# Patient Record
Sex: Female | Born: 1980 | Race: Black or African American | Hispanic: No | Marital: Married | Smoking: Never smoker
Health system: Southern US, Community
[De-identification: ages and names within clinical notes are randomized; demographics above are authoritative.]

## PROBLEM LIST (undated history)

## (undated) ENCOUNTER — Inpatient Hospital Stay (HOSPITAL_COMMUNITY): Payer: Self-pay

## (undated) DIAGNOSIS — M545 Low back pain, unspecified: Secondary | ICD-10-CM

## (undated) DIAGNOSIS — I319 Disease of pericardium, unspecified: Secondary | ICD-10-CM

## (undated) DIAGNOSIS — G8929 Other chronic pain: Secondary | ICD-10-CM

## (undated) DIAGNOSIS — R519 Headache, unspecified: Secondary | ICD-10-CM

## (undated) DIAGNOSIS — R51 Headache: Secondary | ICD-10-CM

## (undated) DIAGNOSIS — I313 Pericardial effusion (noninflammatory): Secondary | ICD-10-CM

## (undated) DIAGNOSIS — I3139 Other pericardial effusion (noninflammatory): Secondary | ICD-10-CM

## (undated) DIAGNOSIS — R1110 Vomiting, unspecified: Secondary | ICD-10-CM

## (undated) DIAGNOSIS — D573 Sickle-cell trait: Secondary | ICD-10-CM

## (undated) DIAGNOSIS — O1495 Unspecified pre-eclampsia, complicating the puerperium: Secondary | ICD-10-CM

## (undated) HISTORY — DX: Pericardial effusion (noninflammatory): I31.3

## (undated) HISTORY — DX: Other pericardial effusion (noninflammatory): I31.39

## (undated) HISTORY — DX: Disease of pericardium, unspecified: I31.9

## (undated) HISTORY — DX: Unspecified pre-eclampsia, complicating the puerperium: O14.95

## (undated) HISTORY — DX: Vomiting, unspecified: R11.10

---
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
## 2002-07-04 ENCOUNTER — Other Ambulatory Visit: Admission: RE | Admit: 2002-07-04 | Discharge: 2002-07-04 | Payer: Self-pay | Admitting: Obstetrics and Gynecology

## 2003-01-11 ENCOUNTER — Inpatient Hospital Stay: Admission: AD | Admit: 2003-01-11 | Discharge: 2003-01-11 | Payer: Self-pay | Admitting: Obstetrics & Gynecology

## 2003-01-13 ENCOUNTER — Inpatient Hospital Stay (HOSPITAL_COMMUNITY): Admission: AD | Admit: 2003-01-13 | Discharge: 2003-01-13 | Payer: Self-pay | Admitting: *Deleted

## 2003-01-17 ENCOUNTER — Inpatient Hospital Stay (HOSPITAL_COMMUNITY): Admission: AD | Admit: 2003-01-17 | Discharge: 2003-01-20 | Payer: Self-pay | Admitting: Obstetrics and Gynecology

## 2003-05-08 ENCOUNTER — Emergency Department (HOSPITAL_COMMUNITY): Admission: EM | Admit: 2003-05-08 | Discharge: 2003-05-08 | Payer: Self-pay | Admitting: Emergency Medicine

## 2003-08-11 ENCOUNTER — Other Ambulatory Visit: Admission: RE | Admit: 2003-08-11 | Discharge: 2003-08-11 | Payer: Self-pay | Admitting: Obstetrics and Gynecology

## 2003-12-11 ENCOUNTER — Emergency Department (HOSPITAL_COMMUNITY): Admission: EM | Admit: 2003-12-11 | Discharge: 2003-12-11 | Payer: Self-pay | Admitting: Emergency Medicine

## 2004-10-04 ENCOUNTER — Other Ambulatory Visit: Admission: RE | Admit: 2004-10-04 | Discharge: 2004-10-04 | Payer: Self-pay | Admitting: Obstetrics and Gynecology

## 2004-11-20 ENCOUNTER — Emergency Department (HOSPITAL_COMMUNITY): Admission: EM | Admit: 2004-11-20 | Discharge: 2004-11-20 | Payer: Self-pay | Admitting: Emergency Medicine

## 2005-03-10 ENCOUNTER — Inpatient Hospital Stay (HOSPITAL_COMMUNITY): Admission: AD | Admit: 2005-03-10 | Discharge: 2005-03-10 | Payer: Self-pay | Admitting: Obstetrics and Gynecology

## 2005-05-07 ENCOUNTER — Inpatient Hospital Stay (HOSPITAL_COMMUNITY): Admission: AD | Admit: 2005-05-07 | Discharge: 2005-05-09 | Payer: Self-pay | Admitting: Pediatrics

## 2005-05-20 ENCOUNTER — Inpatient Hospital Stay (HOSPITAL_COMMUNITY): Admission: AD | Admit: 2005-05-20 | Discharge: 2005-05-23 | Payer: Self-pay | Admitting: Obstetrics and Gynecology

## 2006-11-12 ENCOUNTER — Emergency Department (HOSPITAL_COMMUNITY): Admission: EM | Admit: 2006-11-12 | Discharge: 2006-11-12 | Payer: Self-pay | Admitting: Emergency Medicine

## 2009-01-23 ENCOUNTER — Emergency Department (HOSPITAL_COMMUNITY): Admission: EM | Admit: 2009-01-23 | Discharge: 2009-01-23 | Payer: Self-pay | Admitting: Emergency Medicine

## 2009-07-25 ENCOUNTER — Emergency Department (HOSPITAL_COMMUNITY): Admission: EM | Admit: 2009-07-25 | Discharge: 2009-07-25 | Payer: Self-pay | Admitting: Emergency Medicine

## 2009-09-11 ENCOUNTER — Inpatient Hospital Stay (HOSPITAL_COMMUNITY): Admission: AD | Admit: 2009-09-11 | Discharge: 2009-09-11 | Payer: Self-pay | Admitting: Obstetrics & Gynecology

## 2009-09-24 ENCOUNTER — Inpatient Hospital Stay (HOSPITAL_COMMUNITY): Admission: AD | Admit: 2009-09-24 | Discharge: 2009-09-24 | Payer: Self-pay | Admitting: Obstetrics & Gynecology

## 2009-11-23 ENCOUNTER — Inpatient Hospital Stay (HOSPITAL_COMMUNITY): Admission: AD | Admit: 2009-11-23 | Discharge: 2009-11-24 | Payer: Self-pay | Admitting: Obstetrics and Gynecology

## 2009-11-24 ENCOUNTER — Emergency Department (HOSPITAL_COMMUNITY): Admission: EM | Admit: 2009-11-24 | Discharge: 2009-11-24 | Payer: Self-pay | Admitting: Emergency Medicine

## 2009-12-06 ENCOUNTER — Ambulatory Visit (HOSPITAL_COMMUNITY): Admission: RE | Admit: 2009-12-06 | Discharge: 2009-12-06 | Payer: Self-pay | Admitting: Obstetrics

## 2010-01-15 ENCOUNTER — Inpatient Hospital Stay (HOSPITAL_COMMUNITY): Admission: AD | Admit: 2010-01-15 | Discharge: 2010-01-15 | Payer: Self-pay | Admitting: Obstetrics

## 2010-03-26 ENCOUNTER — Ambulatory Visit (HOSPITAL_COMMUNITY): Admission: RE | Admit: 2010-03-26 | Discharge: 2010-03-26 | Payer: Self-pay | Admitting: Obstetrics & Gynecology

## 2010-03-29 ENCOUNTER — Inpatient Hospital Stay (HOSPITAL_COMMUNITY): Admission: AD | Admit: 2010-03-29 | Discharge: 2010-03-31 | Payer: Self-pay | Admitting: Obstetrics

## 2010-04-28 ENCOUNTER — Inpatient Hospital Stay (HOSPITAL_COMMUNITY): Admission: RE | Admit: 2010-04-28 | Discharge: 2010-05-01 | Payer: Self-pay | Admitting: Obstetrics & Gynecology

## 2010-05-11 ENCOUNTER — Inpatient Hospital Stay (HOSPITAL_COMMUNITY): Admission: AD | Admit: 2010-05-11 | Discharge: 2010-05-16 | Payer: Self-pay | Admitting: Obstetrics

## 2010-09-20 ENCOUNTER — Inpatient Hospital Stay (HOSPITAL_COMMUNITY)
Admission: AD | Admit: 2010-09-20 | Discharge: 2010-09-20 | Disposition: A | Discharge status: Home / Self Care | Payer: Self-pay | Source: Ambulatory Visit | Attending: Obstetrics and Gynecology | Admitting: Obstetrics and Gynecology

## 2010-09-20 DIAGNOSIS — IMO0002 Reserved for concepts with insufficient information to code with codable children: Secondary | ICD-10-CM | POA: Insufficient documentation

## 2010-09-20 LAB — URINALYSIS, ROUTINE W REFLEX MICROSCOPIC
Hgb urine dipstick: NEGATIVE
Nitrite: NEGATIVE

## 2010-09-25 LAB — CBC
HCT: 32.1 % — ABNORMAL LOW (ref 36.0–46.0)
Hemoglobin: 11.4 g/dL — ABNORMAL LOW (ref 12.0–15.0)
Hemoglobin: 9.9 g/dL — ABNORMAL LOW (ref 12.0–15.0)
MCH: 30.2 pg (ref 26.0–34.0)
MCH: 30.3 pg (ref 26.0–34.0)
MCHC: 35.6 g/dL (ref 30.0–36.0)
Platelets: 268 10*3/uL (ref 150–400)
RBC: 3.26 MIL/uL — ABNORMAL LOW (ref 3.87–5.11)
RBC: 3.77 MIL/uL — ABNORMAL LOW (ref 3.87–5.11)
WBC: 7.1 10*3/uL (ref 4.0–10.5)

## 2010-09-26 LAB — URINE CULTURE
Colony Count: NO GROWTH
Culture: NO GROWTH

## 2010-09-26 LAB — URINALYSIS, ROUTINE W REFLEX MICROSCOPIC
Bilirubin Urine: NEGATIVE
Hgb urine dipstick: NEGATIVE
Nitrite: NEGATIVE
Urobilinogen, UA: 2 mg/dL — ABNORMAL HIGH (ref 0.0–1.0)

## 2010-09-26 LAB — CBC
MCH: 29.2 pg (ref 26.0–34.0)
MCHC: 33.2 g/dL (ref 30.0–36.0)
RBC: 3.9 MIL/uL (ref 3.87–5.11)
RDW: 13 % (ref 11.5–15.5)
WBC: 7.8 10*3/uL (ref 4.0–10.5)

## 2010-09-26 LAB — RPR: RPR Ser Ql: NONREACTIVE

## 2010-10-01 LAB — URINALYSIS, ROUTINE W REFLEX MICROSCOPIC
Bilirubin Urine: NEGATIVE
Glucose, UA: NEGATIVE mg/dL
Ketones, ur: NEGATIVE mg/dL
Nitrite: NEGATIVE
Protein, ur: NEGATIVE mg/dL
Protein, ur: NEGATIVE mg/dL
Specific Gravity, Urine: 1.008 (ref 1.005–1.030)
Urobilinogen, UA: 1 mg/dL (ref 0.0–1.0)
pH: 5.5 (ref 5.0–8.0)

## 2010-10-01 LAB — URINE CULTURE: Colony Count: 25000

## 2010-10-07 LAB — URINE MICROSCOPIC-ADD ON

## 2010-10-07 LAB — HCG, QUANTITATIVE, PREGNANCY: hCG, Beta Chain, Quant, S: 89151 m[IU]/mL — ABNORMAL HIGH (ref <5)

## 2010-10-07 LAB — CBC
MCHC: 34.3 g/dL (ref 30.0–36.0)
MCV: 81.3 fL (ref 78.0–100.0)
Platelets: 314 10*3/uL (ref 150–400)
RBC: 4.49 MIL/uL (ref 3.87–5.11)

## 2010-10-07 LAB — URINALYSIS, ROUTINE W REFLEX MICROSCOPIC
Glucose, UA: NEGATIVE mg/dL
Ketones, ur: NEGATIVE mg/dL
Leukocytes, UA: NEGATIVE
Nitrite: NEGATIVE
Protein, ur: NEGATIVE mg/dL
pH: 6 (ref 5.0–8.0)

## 2010-10-07 LAB — WET PREP, GENITAL
Trich, Wet Prep: NONE SEEN
Yeast Wet Prep HPF POC: NONE SEEN

## 2010-10-07 LAB — GC/CHLAMYDIA PROBE AMP, GENITAL
Chlamydia, DNA Probe: NEGATIVE
GC Probe Amp, Genital: NEGATIVE

## 2010-10-20 LAB — POCT I-STAT, CHEM 8
Creatinine, Ser: 0.6 mg/dL (ref 0.4–1.2)
Glucose, Bld: 86 mg/dL (ref 70–99)
Hemoglobin: 13.6 g/dL (ref 12.0–15.0)
Sodium: 138 mEq/L (ref 135–145)
TCO2: 25 mmol/L (ref 0–100)

## 2010-10-20 LAB — CBC
MCHC: 35.3 g/dL (ref 30.0–36.0)
Platelets: 329 10*3/uL (ref 150–400)
RBC: 4.89 MIL/uL (ref 3.87–5.11)
RDW: 13.4 % (ref 11.5–15.5)

## 2010-10-20 LAB — URINE MICROSCOPIC-ADD ON

## 2010-10-20 LAB — GC/CHLAMYDIA PROBE AMP, GENITAL: GC Probe Amp, Genital: NEGATIVE

## 2010-10-20 LAB — URINALYSIS, ROUTINE W REFLEX MICROSCOPIC
Glucose, UA: NEGATIVE mg/dL
Protein, ur: NEGATIVE mg/dL

## 2010-10-20 LAB — COMPREHENSIVE METABOLIC PANEL
ALT: 12 U/L (ref 0–35)
AST: 17 U/L (ref 0–37)
Calcium: 9.3 mg/dL (ref 8.4–10.5)
GFR calc Af Amer: >60 mL/min (ref >60)
Sodium: 136 mEq/L (ref 135–145)
Total Protein: 7.3 g/dL (ref 6.0–8.3)

## 2010-10-20 LAB — WET PREP, GENITAL: WBC, Wet Prep HPF POC: NONE SEEN

## 2010-11-29 NOTE — Discharge Summary (Signed)
Anna Walls, Anna Walls               ACCOUNT NO.:  [id number]   MEDICAL RECORD NO.:  [id number]          PATIENT TYPE:  INP   LOCATION:  9307                          FACILITY:  [facility]   PHYSICIAN:  Miguel Aschoff, M.D.       DATE OF BIRTH:  06/21/1980   DATE OF ADMISSION:  05/20/2005  DATE OF DISCHARGE:  05/23/2005                                 DISCHARGE SUMMARY   FINAL DIAGNOSES:  1.  Intrauterine pregnancy at term.  2.  Fetal hydronephrosis of the right kidney.  3.  Induction of labor.   PROCEDURE:  Spontaneous vaginal delivery of a female infant with Apgars of 9  and 9,  delivery performed by Dr. Malva Limes.  Complications:  None.   This was 30 year old G3, P2-0-0-2, presents at term for induction secondary  to continuous low back pain and pressure.  The patient's antepartum course  up to this point was complicated by the finding of hydronephrosis of the  right fetal kidney on ultrasound.  This was continued to be monitored  throughout the pregnancy, and the decision was made already to consult  postpartum with a pediatric urologist and the patient, I think, is already  scheduled for that.  Pediatrics was also notified at this time.  Otherwise  her antepartum course had been uncomplicated.  She did have a negative group  B strep culture obtained in the office at 35 weeks.  She had amniotomy,  progressed along a normal labor curve, dilated to complete and complete.  She had a spontaneous vaginal delivery of a 7 pound 8 ounce female infant with  Apgars of 9 and 9 over a first degree perineal laceration.  The delivery  went without complications.  The patient's postoperative course was benign  without any significant fevers.  She was felt ready for discharge on  postpartum day #2.  She was sent home on a regular diet, told to decrease  activities, told to continue her prenatal vitamins, was given Darvocet-N 100  one to two every four to six hours as needed for pain.  Was to follow up in  the office in four weeks.   DISCHARGE LABORATORY DATA:  The patient had a hemoglobin of 10.7, white  blood cell count of 5.6, platelets of 408,000.      Leilani Able, P.A.-C.      Miguel Aschoff, M.D.  Electronically Signed    MB/MEDQ  D:  07/31/2005  T:  07/31/2005  Job:  [id number]

## 2011-03-24 ENCOUNTER — Emergency Department (HOSPITAL_COMMUNITY): Payer: Self-pay

## 2011-03-24 ENCOUNTER — Emergency Department (HOSPITAL_COMMUNITY)
Admission: EM | Admit: 2011-03-24 | Discharge: 2011-03-24 | Disposition: A | Discharge status: Home / Self Care | Payer: Self-pay | Attending: Emergency Medicine | Admitting: Emergency Medicine

## 2011-03-24 DIAGNOSIS — M79609 Pain in unspecified limb: Secondary | ICD-10-CM | POA: Insufficient documentation

## 2011-03-24 DIAGNOSIS — R079 Chest pain, unspecified: Secondary | ICD-10-CM | POA: Insufficient documentation

## 2011-03-24 DIAGNOSIS — R059 Cough, unspecified: Secondary | ICD-10-CM | POA: Insufficient documentation

## 2011-03-24 DIAGNOSIS — J3489 Other specified disorders of nose and nasal sinuses: Secondary | ICD-10-CM | POA: Insufficient documentation

## 2011-03-24 DIAGNOSIS — IMO0001 Reserved for inherently not codable concepts without codable children: Secondary | ICD-10-CM | POA: Insufficient documentation

## 2011-03-24 DIAGNOSIS — R630 Anorexia: Secondary | ICD-10-CM | POA: Insufficient documentation

## 2011-03-24 DIAGNOSIS — M25569 Pain in unspecified knee: Secondary | ICD-10-CM | POA: Insufficient documentation

## 2011-03-24 DIAGNOSIS — R5381 Other malaise: Secondary | ICD-10-CM | POA: Insufficient documentation

## 2011-03-24 DIAGNOSIS — J029 Acute pharyngitis, unspecified: Secondary | ICD-10-CM | POA: Insufficient documentation

## 2011-03-24 DIAGNOSIS — R05 Cough: Secondary | ICD-10-CM | POA: Insufficient documentation

## 2011-07-29 ENCOUNTER — Encounter (HOSPITAL_COMMUNITY): Payer: Self-pay | Admitting: *Deleted

## 2011-07-29 ENCOUNTER — Emergency Department (HOSPITAL_COMMUNITY)
Admission: EM | Admit: 2011-07-29 | Discharge: 2011-07-29 | Disposition: A | Discharge status: Home / Self Care | Payer: Medicaid Other | Attending: Emergency Medicine | Admitting: Emergency Medicine

## 2011-07-29 DIAGNOSIS — M25559 Pain in unspecified hip: Secondary | ICD-10-CM | POA: Insufficient documentation

## 2011-07-29 DIAGNOSIS — M545 Low back pain, unspecified: Secondary | ICD-10-CM | POA: Insufficient documentation

## 2011-07-29 DIAGNOSIS — R079 Chest pain, unspecified: Secondary | ICD-10-CM | POA: Insufficient documentation

## 2011-07-29 DIAGNOSIS — M79609 Pain in unspecified limb: Secondary | ICD-10-CM | POA: Insufficient documentation

## 2011-07-29 DIAGNOSIS — N644 Mastodynia: Secondary | ICD-10-CM | POA: Insufficient documentation

## 2011-07-29 DIAGNOSIS — N6009 Solitary cyst of unspecified breast: Secondary | ICD-10-CM | POA: Insufficient documentation

## 2011-07-29 MED ORDER — HYDROCODONE-ACETAMINOPHEN 5-325 MG PO TABS: 1.0000 | ORAL_TABLET | ORAL | Status: AC | PRN

## 2011-07-29 NOTE — ED Provider Notes (Signed)
History     CSN: [id number]  Arrival date & time 07/29/11  1749   First MD Initiated Contact with Patient 07/29/11 1806      No chief complaint on file.   (Consider location/radiation/quality/duration/timing/severity/associated sxs/prior treatment) Patient is a 31 y.o. female presenting with chest pain. The history is provided by the patient.  Chest Pain Pertinent negatives for primary symptoms include no fever, no shortness of breath and no cough. Associated symptoms comments: She is a nursing mother of a 56 month old baby who has recurrent breast and nipple pain with intermittent nipple bleeding. Today she is concerned about a painful swelling to the medial left breast at sternal border that has been there for weeks. She also complains of lower left back pain that radiates into hip and thigh. No injury. No numbness or tingling. No weakness. Marland Kitchen     History reviewed. No pertinent past medical history.  History reviewed. No pertinent past surgical history.  No family history on file.  History  Substance Use Topics  . Smoking status: Not on file  . Smokeless tobacco: Not on file  . Alcohol Use: Not on file    OB History    Grav Para Term Preterm Abortions TAB SAB Ect Mult Living                  Review of Systems  Constitutional: Negative for fever and chills.  HENT: Negative.   Respiratory: Negative.  Negative for cough and shortness of breath.   Cardiovascular: Positive for chest pain.  Gastrointestinal: Negative.   Musculoskeletal:       See HPI.  Skin: Negative.   Neurological: Negative.     Allergies  Review of patient's allergies indicates no known allergies.  Home Medications   Current Outpatient Rx  Name Route Sig Dispense Refill  . BC HEADACHE POWDER PO Oral Take 1 packet by mouth daily as needed. For headaches.    . IBUPROFEN 800 MG PO TABS Oral Take 800 mg by mouth every 8 (eight) hours as needed. For pain.      BP 111/74  Pulse 69  Temp 97.8 F  (36.6 C)  Resp 18  Ht 5\' 5"  (1.651 m)  Wt 180 lb (81.647 kg)  BMI 29.95 kg/m2  SpO2 100%  LMP 06/28/2011  Physical Exam  Constitutional: She appears well-developed and well-nourished.  HENT:  Head: Normocephalic.  Neck: Normal range of motion. Neck supple.  Cardiovascular: Normal rate and regular rhythm.   Pulmonary/Chest: Effort normal and breath sounds normal.       Large symmetric breast without redness, swelling, mass or induration. Small nodular, mobile cyst to left breast at border of chest wall at sternum. Nipples unremarkable without discoloration or bleeding.  Abdominal: Soft. Bowel sounds are normal. There is no tenderness. There is no rebound and no guarding.  Musculoskeletal: Normal range of motion.       Mild lumbar and paralumbar tenderness without swelling or discoloration.  Neurological: She is alert. She has normal reflexes. No cranial nerve deficit. Coordination normal.  Skin: Skin is warm and dry. No rash noted.  Psychiatric: She has a normal mood and affect.    ED Course  Procedures (including critical care time)  Labs Reviewed - No data to display No results found.   No diagnosis found.    MDM  I called [facility] and arranged a consultation with lactation clinic to discuss recurrent pain with breast feeding that has occurred since beginning feeding. No  current symptoms or physical exam findings.         Rodena Medin, PA-C 07/29/11 1903

## 2011-07-29 NOTE — ED Provider Notes (Signed)
Medical screening examination/treatment/procedure(s) were performed by non-physician practitioner and as supervising physician I was immediately available for consultation/collaboration.Devoria Albe, MD, Armando Gang   Ward Givens, MD 07/29/11 2227

## 2011-07-29 NOTE — ED Notes (Signed)
Pt states she is have pain in her left breast for 3 weeks. Pt states she is breast feeding and noticed a bump in left breast. Pt states when she is breast feeding her child she noticed blood coming out of the nipple. Pt denies chest pain.pt states this is a recurrent issuse

## 2011-07-30 ENCOUNTER — Ambulatory Visit (HOSPITAL_COMMUNITY)
Admit: 2011-07-30 | Discharge: 2011-07-30 | Disposition: A | Discharge status: Home / Self Care | Payer: Medicaid Other | Attending: Obstetrics and Gynecology | Admitting: Obstetrics and Gynecology

## 2011-07-30 MED ORDER — FLUCONAZOLE 150 MG PO TABS: 150.0000 mg | ORAL_TABLET | Freq: Every day | ORAL | Status: AC

## 2011-07-30 NOTE — Progress Notes (Signed)
Adult Lactation Consultation Outpatient Visit Note  Patient Name: Anna Walls Date of Birth: Nov 24, 1980 Gestational Age at Delivery: Unknown Type of Delivery: NSVD  Breastfeeding History: Frequency of Breastfeeding: 9 TIMES/DAY Length of Feeding:  Voids: N/A Stools: N/A  Supplementing / Method: SOLID FOODS Pumping:NONE  Type of Pump:   Frequency:  Volume:    Comments:    Consultation Evaluation: Patient here today by referral from Emergency room PA who saw patient yesterday PM for chronic left sided breast pain.  Baby is 30 mo old but not present with mom today.  Patient reports readmission to hospital at 1  Week postpartum for treatment of mastitis.  Patient states she has gone to the ED 3 times for this problem and has been sent home with instructions to treat as plugged duct.  She reports using warm soaks to breast several times. No breast studies have been done. Patient describes tenderness around areolar tissue and nipple on left breast which has been present since birth of baby. Patient also reports bright red bleeding from left nipple on and off. Left breast full but on palpation no areas of firmness , lumps or redness.  No nipple trauma noted.  Call placed to [place], CNM on call for faculty service today.  She came to see patient in lactation office and examined patient.  Plan discussed with patient to treat nipple with all purpose nipple cream, diflucan and radiology will call patient to schedule breast study.  Follow up planned at GYN clinic at [facility].   Initial Feeding Assessment: Pre-feed Weight: Post-feed Weight: Amount Transferred: Comments:  Additional Feeding Assessment: Pre-feed Weight: Post-feed Weight: Amount Transferred: Comments:  Additional Feeding Assessment: Pre-feed Weight: Post-feed Weight: Amount Transferred: Comments:  Total Breast milk Transferred this Visit:  Total Supplement Given:   Additional Interventions:   Follow-Up   GYN clinic [facility]      Hansel Feinstein 07/30/2011, 3:05 PM

## 2011-07-30 NOTE — Progress Notes (Signed)
Was called by lactation consultant regarding [facility] Anna Walls on-going left breast pain and intermittent bleeding. Agree w/ assessment. No obvious etiology seen. Pt reports peeling of the nipple. Will Tx for nipple yeast w/ All Purpose Nipple Ointment and Diflucan. Discussed imaging options w/ Anna Walls. Will scheduled pt w/ [facility] RN in 08/05/11, assess response to Tx and scheduled breast US.  Dorathy Kinsman 07/30/2011 4:27 PM

## 2011-07-31 ENCOUNTER — Encounter (HOSPITAL_COMMUNITY): Payer: Self-pay

## 2011-09-09 ENCOUNTER — Emergency Department (HOSPITAL_COMMUNITY)
Admission: EM | Admit: 2011-09-09 | Discharge: 2011-09-09 | Disposition: A | Discharge status: Home Health | Payer: Medicaid Other | Attending: Emergency Medicine | Admitting: Emergency Medicine

## 2011-09-09 ENCOUNTER — Encounter (HOSPITAL_COMMUNITY): Payer: Self-pay | Admitting: Emergency Medicine

## 2011-09-09 DIAGNOSIS — R05 Cough: Secondary | ICD-10-CM | POA: Insufficient documentation

## 2011-09-09 DIAGNOSIS — R1310 Dysphagia, unspecified: Secondary | ICD-10-CM | POA: Insufficient documentation

## 2011-09-09 DIAGNOSIS — R070 Pain in throat: Secondary | ICD-10-CM | POA: Insufficient documentation

## 2011-09-09 DIAGNOSIS — R059 Cough, unspecified: Secondary | ICD-10-CM | POA: Insufficient documentation

## 2011-09-09 DIAGNOSIS — R509 Fever, unspecified: Secondary | ICD-10-CM | POA: Insufficient documentation

## 2011-09-09 DIAGNOSIS — J3489 Other specified disorders of nose and nasal sinuses: Secondary | ICD-10-CM | POA: Insufficient documentation

## 2011-09-09 DIAGNOSIS — R5381 Other malaise: Secondary | ICD-10-CM | POA: Insufficient documentation

## 2011-09-09 DIAGNOSIS — H9209 Otalgia, unspecified ear: Secondary | ICD-10-CM | POA: Insufficient documentation

## 2011-09-09 DIAGNOSIS — IMO0001 Reserved for inherently not codable concepts without codable children: Secondary | ICD-10-CM | POA: Insufficient documentation

## 2011-09-09 DIAGNOSIS — R220 Localized swelling, mass and lump, head: Secondary | ICD-10-CM | POA: Insufficient documentation

## 2011-09-09 DIAGNOSIS — J329 Chronic sinusitis, unspecified: Secondary | ICD-10-CM

## 2011-09-09 DIAGNOSIS — R350 Frequency of micturition: Secondary | ICD-10-CM | POA: Insufficient documentation

## 2011-09-09 DIAGNOSIS — R599 Enlarged lymph nodes, unspecified: Secondary | ICD-10-CM | POA: Insufficient documentation

## 2011-09-09 LAB — URINALYSIS, ROUTINE W REFLEX MICROSCOPIC
Ketones, ur: NEGATIVE mg/dL
Leukocytes, UA: NEGATIVE
Nitrite: NEGATIVE
Specific Gravity, Urine: 1.009 (ref 1.005–1.030)
Urobilinogen, UA: 0.2 mg/dL (ref 0.0–1.0)
pH: 6 (ref 5.0–8.0)

## 2011-09-09 LAB — RAPID STREP SCREEN (MED CTR MEBANE ONLY): Streptococcus, Group A Screen (Direct): NEGATIVE

## 2011-09-09 MED ORDER — NAPROXEN 500 MG PO TABS: 500.0000 mg | ORAL_TABLET | Freq: Two times a day (BID) | ORAL | Status: DC

## 2011-09-09 MED ORDER — IBUPROFEN 800 MG PO TABS
800.0000 mg | ORAL_TABLET | Freq: Once | ORAL | Status: AC
  Administered 2011-09-09: 800 mg via ORAL
  Filled 2011-09-09: qty 1

## 2011-09-09 MED ORDER — FLUTICASONE PROPIONATE 50 MCG/ACT NA SUSP: 2.0000 | Freq: Every day | NASAL | Status: DC

## 2011-09-09 MED ORDER — AMOXICILLIN-POT CLAVULANATE 875-125 MG PO TABS: 1.0000 | ORAL_TABLET | Freq: Two times a day (BID) | ORAL | Status: AC

## 2011-09-09 NOTE — ED Provider Notes (Signed)
History     CSN: [id number]  Arrival date & time 09/09/11  1815   First MD Initiated Contact with Patient 09/09/11 1956      Chief Complaint  Patient presents with  . URI    (Consider location/radiation/quality/duration/timing/severity/associated sxs/prior treatment) Patient is a 31 y.o. female presenting with URI and frequency. The history is provided by the patient.  URI The primary symptoms include fever, fatigue, headaches, ear pain, sore throat, cough and myalgias. Primary symptoms do not include swollen glands, wheezing, abdominal pain, nausea, vomiting, arthralgias or rash. The current episode started more than 1 week ago (for 4 weeks ). This is a new problem. The problem has been gradually worsening.  The fever began yesterday. The maximum temperature recorded prior to her arrival was 102 to 102.9 F. The temperature was taken by an oral thermometer.  The headache began more than 2 days ago (3 days). Headache is a recurrent problem. The pain from the headache is at a severity of 7/10. The headache is not associated with aura, photophobia, double vision, eye pain, decreased vision, stiff neck, paresthesias, weakness or loss of balance.  The sore throat began more than 2 days ago. The sore throat has been gradually worsening since its onset. The sore throat is moderate in intensity. The sore throat is accompanied by trouble swallowing. The sore throat is not accompanied by drooling, hoarse voice or stridor.   The myalgias are not associated with weakness.  Symptoms associated with the illness include chills, plugged ear sensation, facial pain, sinus pressure, congestion and rhinorrhea.  Urinary Frequency This is a new problem. The current episode started yesterday. The problem occurs constantly. The problem has been gradually worsening. Associated symptoms include chills, congestion, coughing, fatigue, a fever, headaches, myalgias and a sore throat. Pertinent negatives include no  abdominal pain, arthralgias, nausea, rash, swollen glands, vomiting or weakness.    History reviewed. No pertinent past medical history.  History reviewed. No pertinent past surgical history.  No family history on file.  History  Substance Use Topics  . Smoking status: Not on file  . Smokeless tobacco: Not on file  . Alcohol Use: Not on file    OB History    Grav Para Term Preterm Abortions TAB SAB Ect Mult Living                  Review of Systems  Constitutional: Positive for fever, chills and fatigue.  HENT: Positive for ear pain, congestion, sore throat, rhinorrhea, trouble swallowing and sinus pressure. Negative for hoarse voice and drooling.   Eyes: Negative for double vision, photophobia and pain.  Respiratory: Positive for cough. Negative for wheezing and stridor.   Gastrointestinal: Negative for nausea, vomiting and abdominal pain.  Genitourinary: Positive for frequency.  Musculoskeletal: Positive for myalgias. Negative for arthralgias.  Skin: Negative for rash.  Neurological: Positive for headaches. Negative for weakness, paresthesias and loss of balance.  All other systems reviewed and are negative.    Allergies  Review of patient's allergies indicates no known allergies.  Home Medications   Current Outpatient Rx  Name Route Sig Dispense Refill  . IBUPROFEN 800 MG PO TABS Oral Take 800 mg by mouth every 8 (eight) hours as needed. For pain.      BP 120/87  Pulse 68  Temp 99.4 F (37.4 C)  Resp 20  SpO2 99%  LMP 09/06/2011  Physical Exam  Nursing note and vitals reviewed. Constitutional: She is oriented to person, place, and time. She  appears well-developed and well-nourished. No distress.  HENT:  Head: Normocephalic and atraumatic. No trismus in the jaw.  Right Ear: Tympanic membrane, external ear and ear canal normal.  Left Ear: Tympanic membrane, external ear and ear canal normal.  Nose: Nose normal. No rhinorrhea. Right sinus exhibits no  maxillary sinus tenderness and no frontal sinus tenderness. Left sinus exhibits no maxillary sinus tenderness and no frontal sinus tenderness.  Mouth/Throat: Uvula is midline and mucous membranes are normal. Normal dentition. No dental abscesses or uvula swelling. Posterior oropharyngeal edema present. No oropharyngeal exudate, posterior oropharyngeal erythema or tonsillar abscesses.       No submental edema, tongue not elevated, no trismus. No impending airway obstruction; Pt able to speak full sentences, swallow intact, no drooling, stridor, or tonsillar/uvula displacement. TM and canals normal bilaterally. Nasal congestion present. ttp of frontal and maxillary sinuses  Eyes: Conjunctivae are normal.  Neck: Trachea normal, normal range of motion and full passive range of motion without pain. Neck supple. No rigidity. Erythema present. Normal range of motion present. No Brudzinski's sign noted.       Negative Bolte sign  Cardiovascular: Normal rate and regular rhythm.   Pulmonary/Chest: Effort normal and breath sounds normal. No stridor. No respiratory distress. She has no wheezes.  Abdominal: Soft. There is no tenderness.  Musculoskeletal: Normal range of motion.  Lymphadenopathy:       Head (right side): No preauricular and no posterior auricular adenopathy present.       Head (left side): No preauricular and no posterior auricular adenopathy present.    She has cervical adenopathy.  Neurological: She is alert and oriented to person, place, and time.  Skin: Skin is warm and dry. No rash noted. She is not diaphoretic.  Psychiatric: She has a normal mood and affect.    ED Course  Procedures (including critical care time)   Labs Reviewed  URINALYSIS, ROUTINE W REFLEX MICROSCOPIC  PREGNANCY, URINE  RAPID STREP SCREEN   No results found.   No diagnosis found.    MDM  Sinusitis  Pt with fevers, acute facial pain, swelling and erythema will be dc w Augmentin for 14 days. Pt also  given fluticasone. Pt presentation not concerning for retropharyngeal or peritonsilar abscess. Strep test negative. UA no sign of UTI         Jaci Carrel, [place] 09/09/11 2158

## 2011-09-09 NOTE — ED Notes (Signed)
Pt alert, nad c/o URI, onset several weeks ago, resp even unabored, skin pwd, nasal congestion noted

## 2011-09-09 NOTE — ED Provider Notes (Signed)
Medical screening examination/treatment/procedure(s) were performed by non-physician practitioner and as supervising physician I was immediately available for consultation/collaboration.   Joya Gaskins, MD 09/09/11 [phone]

## 2011-09-09 NOTE — Discharge Instructions (Signed)

## 2011-09-19 ENCOUNTER — Emergency Department (HOSPITAL_COMMUNITY)
Admission: RE | Admit: 2011-09-19 | Discharge: 2011-09-19 | Disposition: A | Discharge status: Home / Self Care | Payer: Self-pay | Attending: Emergency Medicine | Admitting: Emergency Medicine

## 2011-09-19 ENCOUNTER — Encounter (HOSPITAL_COMMUNITY): Payer: Self-pay | Admitting: Emergency Medicine

## 2011-09-19 DIAGNOSIS — R197 Diarrhea, unspecified: Secondary | ICD-10-CM | POA: Insufficient documentation

## 2011-09-19 DIAGNOSIS — R509 Fever, unspecified: Secondary | ICD-10-CM | POA: Insufficient documentation

## 2011-09-19 DIAGNOSIS — K5289 Other specified noninfective gastroenteritis and colitis: Secondary | ICD-10-CM | POA: Insufficient documentation

## 2011-09-19 DIAGNOSIS — K529 Noninfective gastroenteritis and colitis, unspecified: Secondary | ICD-10-CM

## 2011-09-19 DIAGNOSIS — R109 Unspecified abdominal pain: Secondary | ICD-10-CM | POA: Insufficient documentation

## 2011-09-19 DIAGNOSIS — R10819 Abdominal tenderness, unspecified site: Secondary | ICD-10-CM | POA: Insufficient documentation

## 2011-09-19 DIAGNOSIS — K1370 Unspecified lesions of oral mucosa: Secondary | ICD-10-CM | POA: Insufficient documentation

## 2011-09-19 DIAGNOSIS — Z79899 Other long term (current) drug therapy: Secondary | ICD-10-CM | POA: Insufficient documentation

## 2011-09-19 DIAGNOSIS — R1110 Vomiting, unspecified: Secondary | ICD-10-CM | POA: Insufficient documentation

## 2011-09-19 LAB — URINALYSIS, ROUTINE W REFLEX MICROSCOPIC
Bilirubin Urine: NEGATIVE
Glucose, UA: NEGATIVE mg/dL
Ketones, ur: 15 mg/dL — AB
Protein, ur: NEGATIVE mg/dL

## 2011-09-19 LAB — URINE MICROSCOPIC-ADD ON

## 2011-09-19 MED ORDER — PROMETHAZINE HCL 25 MG PO TABS: 25.0000 mg | ORAL_TABLET | Freq: Four times a day (QID) | ORAL | Status: DC | PRN

## 2011-09-19 MED ORDER — ONDANSETRON HCL 4 MG/2ML IJ SOLN
4.0000 mg | Freq: Once | INTRAMUSCULAR | Status: AC
  Administered 2011-09-19: 4 mg via INTRAVENOUS
  Filled 2011-09-19: qty 2

## 2011-09-19 MED ORDER — HYDROCODONE-ACETAMINOPHEN 5-325 MG PO TABS: 1.0000 | ORAL_TABLET | ORAL | Status: AC | PRN

## 2011-09-19 MED ORDER — SODIUM CHLORIDE 0.9 % IV BOLUS (SEPSIS)
1000.0000 mL | Freq: Once | INTRAVENOUS | Status: AC
  Administered 2011-09-19: 1000 mL via INTRAVENOUS

## 2011-09-19 MED ORDER — MORPHINE SULFATE 4 MG/ML IJ SOLN
4.0000 mg | Freq: Once | INTRAMUSCULAR | Status: AC
  Administered 2011-09-19: 4 mg via INTRAVENOUS
  Filled 2011-09-19: qty 1

## 2011-09-19 MED ORDER — METOCLOPRAMIDE HCL 5 MG/ML IJ SOLN
10.0000 mg | Freq: Once | INTRAMUSCULAR | Status: AC
  Administered 2011-09-19: 10 mg via INTRAVENOUS
  Filled 2011-09-19: qty 2

## 2011-09-19 NOTE — ED Provider Notes (Signed)
  Physical Exam  BP 106/67  Pulse 75  Temp(Src) 98.3 F (36.8 C) (Oral)  Resp 14  Ht 5\' 4"  (1.626 m)  Wt 190 lb (86.183 kg)  BMI 32.61 kg/m2  SpO2 100%  LMP 09/06/2011  Physical Exam Patient, states she's feeling better.  Her headache has resolved after my discussion with, Katie she'll ever PA discharge patient home with antiemetic] diet instructions ED Course  Procedures  MDM Nausea, vomiting, diarrhea.  That has resolved in the emergency room with IV treatment and antiemetics.  She then developed headache, which was treated and has resolved      Arman Filter, NP 09/19/11 2131

## 2011-09-19 NOTE — ED Notes (Signed)
C/o n/v/d x 3 days and now feels weak.

## 2011-09-19 NOTE — Discharge Instructions (Signed)
Take vicodin as prescribed for severe pain.   Do not drive within four hours of taking this medication (may cause drowsiness or confusion).  Take promethazine as prescribed for nausea.   You can take imodium for diarrhea if you are experiencing several episodes a day.  Drink plenty of fluids to prevent dehydration.  Follow up with your primary care doctor.  Call [facility] [phone]) if you do not have a primary care doctor and would like assistance with finding one.  You should return to the ER if you develop worsening pain or uncontrolled vomiting.

## 2011-09-19 NOTE — ED Provider Notes (Signed)
History     CSN: [id number]  Arrival date & time 09/19/11  1413   First MD Initiated Contact with Patient 09/19/11 1805      Chief Complaint  Patient presents with  . Diarrhea    x 3 days.    . Emesis    x 3 days.    (Consider location/radiation/quality/duration/timing/severity/associated sxs/prior treatment) HPI History provided by pt.   Pt has had diarrhea and vomiting for the past 2 days.  Associated w/ diffuse lower abd pain that started after onset of vomiting and is aggravated by vomiting.  Also associated w/ fever, max temp 102 yesterday.  Denies CP, SOB, cough,  Hematemesis/hematochezia/melena, GU sx.  No h/o abd surgeries.  No known sick contacts.  No recent travel.     History reviewed. No pertinent past medical history.  History reviewed. No pertinent past surgical history.  No family history on file.  History  Substance Use Topics  . Smoking status: Not on file  . Smokeless tobacco: Not on file  . Alcohol Use: Not on file    OB History    Grav Para Term Preterm Abortions TAB SAB Ect Mult Living                  Review of Systems  All other systems reviewed and are negative.    Allergies  Review of patient's allergies indicates no known allergies.  Home Medications   Current Outpatient Rx  Name Route Sig Dispense Refill  . AMOXICILLIN-POT CLAVULANATE 875-125 MG PO TABS Oral Take 1 tablet by mouth every 12 (twelve) hours. 28 tablet 0  . BISMUTH SUBSALICYLATE 262 MG PO CHEW Oral Chew 524 mg by mouth daily as needed. Upset stomach    . FLUTICASONE PROPIONATE 50 MCG/ACT NA SUSP Nasal Place 2 sprays into the nose daily. 16 g 2  . IBUPROFEN 800 MG PO TABS Oral Take 800 mg by mouth every 8 (eight) hours as needed. For pain.    Marland Kitchen NAPROXEN 500 MG PO TABS Oral Take 1 tablet (500 mg total) by mouth 2 (two) times daily. 30 tablet 0    BP 104/71  Pulse 96  Temp(Src) 99 F (37.2 C) (Oral)  Resp 18  Ht 5\' 4"  (1.626 m)  Wt 190 lb (86.183 kg)  BMI 32.61  kg/m2  SpO2 100%  LMP 09/06/2011  Physical Exam  Nursing note and vitals reviewed. Constitutional: She is oriented to person, place, and time. She appears well-developed and well-nourished. No distress.  HENT:  Head: Normocephalic and atraumatic.  Mouth/Throat: Oropharyngeal exudate present.       Shallow ulceration hard palate just posterior to left central incisor.    Eyes:       Normal appearance  Neck: Normal range of motion.  Cardiovascular: Normal rate and regular rhythm.   Pulmonary/Chest: Effort normal and breath sounds normal.  Abdominal: Soft. Bowel sounds are normal. She exhibits no distension and no mass. There is no rebound and no guarding.       Mild tenderness epigastrium and mid-line lower abd.  No CVA tenderness  Musculoskeletal: Normal range of motion.  Lymphadenopathy:    She has no cervical adenopathy.  Neurological: She is alert and oriented to person, place, and time.  Skin: Skin is warm and dry. No rash noted.  Psychiatric: She has a normal mood and affect. Her behavior is normal.    ED Course  Procedures (including critical care time)  Labs Reviewed  URINALYSIS, ROUTINE W REFLEX MICROSCOPIC -  Abnormal; Notable for the following:    APPearance CLOUDY (*)    Hgb urine dipstick TRACE (*)    Ketones, ur 15 (*)    Leukocytes, UA SMALL (*)    All other components within normal limits  URINE MICROSCOPIC-ADD ON - Abnormal; Notable for the following:    Squamous Epithelial / LPF FEW (*)    Bacteria, UA FEW (*)    All other components within normal limits  PREGNANCY, URINE   No results found.   1. Gastroenteritis       MDM  Healthy 31yo F presents w/ c/o N/V/D x 3 days + lower abd pain.  Suspect that abd pain muscular b/c started after vomiting and is aggravated by vomiting.  On exam, afebrile, NAD, mildy dehydrated w/ HR 96, abd benign but mildly tender mid-line lower as as well as epigastrium.  Possible UTI based on U/A but unlikely based on history.   Sent for culture.  Pt received IV NS bolus, morphine and zofran.  Reports that her abd pain is better but continues to have nausea and now experiencing a headache.  A second liter bolus as well as IV reglan ordered.  She is attempting po challenge now.  Manus Rudd, NP will reassess and dispo.          Anna Walls, [place] 09/19/11 2129

## 2011-09-20 NOTE — ED Provider Notes (Signed)
Medical screening examination/treatment/procedure(s) were performed by non-physician practitioner and as supervising physician I was immediately available for consultation/collaboration.   Bayle Calvo, MD 09/20/11 0846 

## 2011-09-20 NOTE — ED Provider Notes (Signed)
Medical screening examination/treatment/procedure(s) were performed by non-physician practitioner and as supervising physician I was immediately available for consultation/collaboration.   Geoffery Lyons, MD 09/20/11 [phone]

## 2011-09-22 LAB — URINE CULTURE: Culture  Setup Time: 201303090240

## 2011-09-23 NOTE — ED Notes (Signed)
+   urine Chart sent to EDP office for review. 

## 2011-09-24 NOTE — ED Notes (Signed)
Rx for Cipro 500 mg bid x 7 days written by C Schinlever  Need to be called to [facility] [phone].Patient informed of positive results after id'd x 2 .

## 2012-06-15 ENCOUNTER — Emergency Department (HOSPITAL_COMMUNITY)
Admission: EM | Admit: 2012-06-15 | Discharge: 2012-06-15 | Disposition: A | Discharge status: Home / Self Care | Payer: Self-pay | Attending: Emergency Medicine | Admitting: Emergency Medicine

## 2012-06-15 ENCOUNTER — Encounter (HOSPITAL_COMMUNITY): Payer: Self-pay | Admitting: Emergency Medicine

## 2012-06-15 ENCOUNTER — Emergency Department (HOSPITAL_COMMUNITY): Payer: Self-pay

## 2012-06-15 DIAGNOSIS — D573 Sickle-cell trait: Secondary | ICD-10-CM | POA: Insufficient documentation

## 2012-06-15 DIAGNOSIS — N898 Other specified noninflammatory disorders of vagina: Secondary | ICD-10-CM | POA: Insufficient documentation

## 2012-06-15 DIAGNOSIS — G8929 Other chronic pain: Secondary | ICD-10-CM | POA: Insufficient documentation

## 2012-06-15 DIAGNOSIS — M545 Low back pain, unspecified: Secondary | ICD-10-CM | POA: Insufficient documentation

## 2012-06-15 DIAGNOSIS — Z3202 Encounter for pregnancy test, result negative: Secondary | ICD-10-CM | POA: Insufficient documentation

## 2012-06-15 DIAGNOSIS — R5381 Other malaise: Secondary | ICD-10-CM | POA: Insufficient documentation

## 2012-06-15 DIAGNOSIS — R52 Pain, unspecified: Secondary | ICD-10-CM | POA: Insufficient documentation

## 2012-06-15 DIAGNOSIS — N390 Urinary tract infection, site not specified: Secondary | ICD-10-CM | POA: Insufficient documentation

## 2012-06-15 DIAGNOSIS — R51 Headache: Secondary | ICD-10-CM | POA: Insufficient documentation

## 2012-06-15 DIAGNOSIS — R5383 Other fatigue: Secondary | ICD-10-CM | POA: Insufficient documentation

## 2012-06-15 DIAGNOSIS — R300 Dysuria: Secondary | ICD-10-CM | POA: Insufficient documentation

## 2012-06-15 DIAGNOSIS — R509 Fever, unspecified: Secondary | ICD-10-CM | POA: Insufficient documentation

## 2012-06-15 DIAGNOSIS — Z79899 Other long term (current) drug therapy: Secondary | ICD-10-CM | POA: Insufficient documentation

## 2012-06-15 HISTORY — DX: Low back pain, unspecified: M54.50

## 2012-06-15 HISTORY — DX: Other chronic pain: G89.29

## 2012-06-15 HISTORY — DX: Low back pain: M54.5

## 2012-06-15 HISTORY — DX: Sickle-cell trait: D57.3

## 2012-06-15 HISTORY — DX: Headache, unspecified: R51.9

## 2012-06-15 HISTORY — DX: Headache: R51

## 2012-06-15 LAB — COMPREHENSIVE METABOLIC PANEL
Alkaline Phosphatase: 68 U/L (ref 39–117)
BUN: 8 mg/dL (ref 6–23)
Calcium: 9.3 mg/dL (ref 8.4–10.5)
Creatinine, Ser: 0.58 mg/dL (ref 0.50–1.10)
GFR calc Af Amer: >90 mL/min (ref >90)
Glucose, Bld: 91 mg/dL (ref 70–99)
Potassium: 4 mEq/L (ref 3.5–5.1)
Total Protein: 7.6 g/dL (ref 6.0–8.3)

## 2012-06-15 LAB — URINALYSIS, MICROSCOPIC ONLY
Bilirubin Urine: NEGATIVE
Nitrite: POSITIVE — AB
Specific Gravity, Urine: 1.016 (ref 1.005–1.030)
Urobilinogen, UA: 1 mg/dL (ref 0.0–1.0)

## 2012-06-15 LAB — CBC WITH DIFFERENTIAL/PLATELET
Eosinophils Absolute: 0.3 10*3/uL (ref 0.0–0.7)
Eosinophils Relative: 4 % (ref 0–5)
HCT: 33.9 % — ABNORMAL LOW (ref 36.0–46.0)
Hemoglobin: 12.2 g/dL (ref 12.0–15.0)
Lymphs Abs: 1.8 10*3/uL (ref 0.7–4.0)
MCH: 27.1 pg (ref 26.0–34.0)
MCV: 75.3 fL — ABNORMAL LOW (ref 78.0–100.0)
Monocytes Absolute: 0.6 10*3/uL (ref 0.1–1.0)
Monocytes Relative: 9 % (ref 3–12)
RBC: 4.5 MIL/uL (ref 3.87–5.11)

## 2012-06-15 LAB — LIPASE, BLOOD: Lipase: 24 U/L (ref 11–59)

## 2012-06-15 MED ORDER — IBUPROFEN 200 MG PO TABS
400.0000 mg | ORAL_TABLET | Freq: Once | ORAL | Status: AC
  Administered 2012-06-15: 400 mg via ORAL
  Filled 2012-06-15: qty 2

## 2012-06-15 MED ORDER — OXYCODONE-ACETAMINOPHEN 5-325 MG PO TABS
2.0000 | ORAL_TABLET | Freq: Once | ORAL | Status: AC
  Administered 2012-06-15: 2 via ORAL
  Filled 2012-06-15: qty 2

## 2012-06-15 MED ORDER — CEPHALEXIN 500 MG PO CAPS: 500.0000 mg | ORAL_CAPSULE | Freq: Four times a day (QID) | ORAL | Status: DC

## 2012-06-15 MED ORDER — CEPHALEXIN 500 MG PO CAPS
500.0000 mg | ORAL_CAPSULE | Freq: Once | ORAL | Status: AC
  Administered 2012-06-15: 500 mg via ORAL
  Filled 2012-06-15: qty 1

## 2012-06-15 NOTE — ED Notes (Signed)
Pt states she has been having left flank/low abd pain x 5 days.  Pt states that she is having dysuria and has been taking Azo w/ no relief.  Also wants [place] to do a "sickle cell test" because her bones hurt.  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

## 2012-06-15 NOTE — ED Provider Notes (Signed)
History     CSN: [id number]  Arrival date & time 06/15/12  1420   First MD Initiated Contact with Patient 06/15/12 1842      Chief Complaint  Patient presents with  . Flank Pain  . Abdominal Pain     HPI Pt was seen at 1905.  Per pt, c/o gradual onset and persistence of constant dysuria for the past 5 days.  Has been associated with generalized body aches/fatigue, chills, vaginal discharge, as well as lower abd pain and left sided LBP.  Pt has been taking OTC AZO without relief.  Denies CP/SOB, no cough, no flank pain, no N/V/D, no rash, no vaginal bleeding.    Past Medical History  Diagnosis Date  . Sickle cell trait   . Low back pain   . Chronic headaches     History reviewed. No pertinent past surgical history.   History  Substance Use Topics  . Smoking status: Never Smoker   . Smokeless tobacco: Not on file  . Alcohol Use: No    Review of Systems ROS: Statement: All systems negative except as marked or noted in the HPI; Constitutional: Negative for fever and chills. ; ; Eyes: Negative for eye pain, redness and discharge. ; ; ENMT: Negative for ear pain, hoarseness, nasal congestion, sinus pressure and sore throat. ; ; Cardiovascular: Negative for chest pain, palpitations, diaphoresis, dyspnea and peripheral edema. ; ; Respiratory: Negative for cough, wheezing and stridor. ; ; Gastrointestinal: +abd pain. Negative for nausea, vomiting, diarrhea, blood in stool, hematemesis, jaundice and rectal bleeding. . ; ; Genitourinary: +dysuria. Negative for flank pain and hematuria. ; ; GYN:  No vaginal bleeding, +vaginal discharge, no vulvar pain.;; Musculoskeletal: +LBP. Negative for neck pain. Negative for swelling and trauma.; ; Skin: Negative for pruritus, rash, abrasions, blisters, bruising and skin lesion.; ; Neuro: Negative for headache, lightheadedness and neck stiffness. Negative for weakness, altered level of consciousness , altered mental status, extremity weakness,  paresthesias, involuntary movement, seizure and syncope.       Allergies  Review of patient's allergies indicates no known allergies.  Home Medications   Current Outpatient Rx  Name  Route  Sig  Dispense  Refill  . IBUPROFEN 800 MG PO TABS   Oral   Take 800 mg by mouth every 8 (eight) hours as needed. For pain.         . AZO-DINE URINARY ANALGESIC PO   Oral   Take 1 tablet by mouth 2 (two) times daily.           BP 101/57  Pulse 109  Temp 100.9 F (38.3 C) (Oral)  Resp 18  SpO2 96%  LMP 05/25/2012  Physical Exam 1910: Physical examination:  Nursing notes reviewed; Vital signs and O2 SAT reviewed;  Constitutional: Well developed, Well nourished, Well hydrated, In no acute distress; Head:  Normocephalic, atraumatic; Eyes: EOMI, PERRL, No scleral icterus; ENMT: Mouth and pharynx normal, Mucous membranes moist; Neck: Supple, Full range of motion, No lymphadenopathy; Cardiovascular: Regular rate and rhythm, No murmur, rub, or gallop; Respiratory: Breath sounds clear & equal bilaterally, No rales, rhonchi, wheezes.  Speaking full sentences with ease, Normal respiratory effort/excursion; Chest: Nontender, Movement normal; Abdomen: Soft, +mild suprapubic tenderness to palp. No rebound or guarding. Nondistended, Normal bowel sounds; Genitourinary: No CVA tenderness, Pelvic exam performed with permission of pt and female ED tech assist during exam.  External genitalia w/o lesions. Vaginal vault with thick white discharge.  Cervix w/o lesions, not friable, GC/chlam and wet  prep obtained and sent to lab.  Bimanual exam w/o CMT or adnexal tenderness, +suprapubic tenderness to palp.;; Spine:  No midline CS, TS, LS tenderness.  +TTP left lumbar paraspinal muscles.;; Extremities: Pulses normal, No tenderness, No edema, No calf edema or asymmetry.; Neuro: AA&Ox3, Major CN grossly intact.  Speech clear. No gross focal motor or sensory deficits in extremities.; Skin: Color normal, Warm, Dry.   ED  Course  Procedures    MDM  MDM Reviewed: nursing note, vitals and previous chart Interpretation: labs and x-ray     Results for orders placed during the hospital encounter of 06/15/12  CBC WITH DIFFERENTIAL      Component Value Range   WBC 7.3  4.0 - 10.5 K/uL   RBC 4.50  3.87 - 5.11 MIL/uL   Hemoglobin 12.2  12.0 - 15.0 g/dL   HCT 16.1 (*) 09.6 - 04.5 %   MCV 75.3 (*) 78.0 - 100.0 fL   MCH 27.1  26.0 - 34.0 pg   MCHC 36.0  30.0 - 36.0 g/dL   RDW 40.9  81.1 - 91.4 %   Platelets 335  150 - 400 K/uL   Neutrophils Relative 62  43 - 77 %   Neutro Abs 4.5  1.7 - 7.7 K/uL   Lymphocytes Relative 24  12 - 46 %   Lymphs Abs 1.8  0.7 - 4.0 K/uL   Monocytes Relative 9  3 - 12 %   Monocytes Absolute 0.6  0.1 - 1.0 K/uL   Eosinophils Relative 4  0 - 5 %   Eosinophils Absolute 0.3  0.0 - 0.7 K/uL   Basophils Relative 0  0 - 1 %   Basophils Absolute 0.0  0.0 - 0.1 K/uL  COMPREHENSIVE METABOLIC PANEL      Component Value Range   Sodium 137  135 - 145 mEq/L   Potassium 4.0  3.5 - 5.1 mEq/L   Chloride 102  96 - 112 mEq/L   CO2 25  19 - 32 mEq/L   Glucose, Bld 91  70 - 99 mg/dL   BUN 8  6 - 23 mg/dL   Creatinine, Ser 7.82  0.50 - 1.10 mg/dL   Calcium 9.3  8.4 - 95.6 mg/dL   Total Protein 7.6  6.0 - 8.3 g/dL   Albumin 3.3 (*) 3.5 - 5.2 g/dL   AST 16  0 - 37 U/L   ALT 9  0 - 35 U/L   Alkaline Phosphatase 68  39 - 117 U/L   Total Bilirubin 0.5  0.3 - 1.2 mg/dL   GFR calc non Af Amer >90  >90 mL/min   GFR calc Af Amer >90  >90 mL/min  LIPASE, BLOOD      Component Value Range   Lipase 24  11 - 59 U/L  URINALYSIS, MICROSCOPIC ONLY      Component Value Range   Color, Urine YELLOW  YELLOW   APPearance CLOUDY (*) CLEAR   Specific Gravity, Urine 1.016  1.005 - 1.030   pH 7.5  5.0 - 8.0   Glucose, UA NEGATIVE  NEGATIVE mg/dL   Hgb urine dipstick NEGATIVE  NEGATIVE   Bilirubin Urine NEGATIVE  NEGATIVE   Ketones, ur NEGATIVE  NEGATIVE mg/dL   Protein, ur NEGATIVE  NEGATIVE mg/dL    Urobilinogen, UA 1.0  0.0 - 1.0 mg/dL   Nitrite POSITIVE (*) NEGATIVE   Leukocytes, UA SMALL (*) NEGATIVE   WBC, UA 7-10  <3 WBC/hpf   Bacteria, UA  MANY (*) RARE   Squamous Epithelial / LPF FEW (*) RARE  POCT PREGNANCY, URINE      Component Value Range   Preg Test, Ur NEGATIVE  NEGATIVE  RAPID STREP SCREEN      Component Value Range   Streptococcus, Group A Screen (Direct) NEGATIVE  NEGATIVE  WET PREP, GENITAL      Component Value Range   Yeast Wet Prep HPF POC NONE SEEN  NONE SEEN   Trich, Wet Prep NONE SEEN  NONE SEEN   Clue Cells Wet Prep HPF POC NONE SEEN  NONE SEEN   WBC, Wet Prep HPF POC RARE (*) NONE SEEN   Dg Chest 2 View 06/15/2012  *RADIOLOGY REPORT*  Clinical Data: Fever.  Sickle cell trait  CHEST - 2 VIEW  Comparison: 03/24/2011  Findings: Heart size is mildly enlarged.  Vascularity normal. Lungs are clear without infiltrate or effusion.  Negative for pneumonia  IMPRESSION: No acute cardiopulmonary abnormality.   Original Report Authenticated By: Janeece Riggers, M.D.     2315:  +UTI, UC pending.  GC/chlam pending.  Fever and body aches improved after APAP and motrin.  Has tol PO well without N/V.  1st does abx given in ED.  Wants to go home now.  Dx and testing d/w pt.  Questions answered.  Verb understanding, agreeable to d/c home with outpt f/u.         Laray Anger, DO 06/17/12 1550

## 2012-06-16 LAB — GC/CHLAMYDIA PROBE AMP
CT Probe RNA: NEGATIVE
GC Probe RNA: NEGATIVE

## 2012-06-19 LAB — URINE CULTURE: Colony Count: >=100000

## 2012-06-20 NOTE — ED Notes (Signed)
+  Urine. Patient treated with Keflex. Sensitive to same. Per protocol MD. °

## 2013-08-09 ENCOUNTER — Encounter (HOSPITAL_COMMUNITY): Payer: Self-pay | Admitting: Emergency Medicine

## 2013-08-09 DIAGNOSIS — R6889 Other general symptoms and signs: Secondary | ICD-10-CM | POA: Insufficient documentation

## 2013-08-09 DIAGNOSIS — M546 Pain in thoracic spine: Secondary | ICD-10-CM | POA: Insufficient documentation

## 2013-08-09 DIAGNOSIS — Z79899 Other long term (current) drug therapy: Secondary | ICD-10-CM | POA: Insufficient documentation

## 2013-08-09 DIAGNOSIS — Z3202 Encounter for pregnancy test, result negative: Secondary | ICD-10-CM | POA: Insufficient documentation

## 2013-08-09 DIAGNOSIS — X58XXXA Exposure to other specified factors, initial encounter: Secondary | ICD-10-CM | POA: Insufficient documentation

## 2013-08-09 DIAGNOSIS — IMO0002 Reserved for concepts with insufficient information to code with codable children: Secondary | ICD-10-CM | POA: Insufficient documentation

## 2013-08-09 DIAGNOSIS — D573 Sickle-cell trait: Secondary | ICD-10-CM | POA: Insufficient documentation

## 2013-08-09 DIAGNOSIS — M545 Low back pain, unspecified: Secondary | ICD-10-CM | POA: Insufficient documentation

## 2013-08-09 DIAGNOSIS — R071 Chest pain on breathing: Secondary | ICD-10-CM | POA: Insufficient documentation

## 2013-08-09 DIAGNOSIS — Y939 Activity, unspecified: Secondary | ICD-10-CM | POA: Insufficient documentation

## 2013-08-09 DIAGNOSIS — M25569 Pain in unspecified knee: Secondary | ICD-10-CM | POA: Insufficient documentation

## 2013-08-09 DIAGNOSIS — G8929 Other chronic pain: Secondary | ICD-10-CM | POA: Insufficient documentation

## 2013-08-09 DIAGNOSIS — Y929 Unspecified place or not applicable: Secondary | ICD-10-CM | POA: Insufficient documentation

## 2013-08-09 LAB — POCT I-STAT TROPONIN I: Troponin i, poc: 0 ng/mL (ref 0.00–0.08)

## 2013-08-09 NOTE — ED Notes (Signed)
Pt. reports mid chest pain for 4 days , right back pain /low abdominal pain for 3 days and bilateral knee pain for several days , denies SOB , nausea or diaphoresis .

## 2013-08-10 ENCOUNTER — Emergency Department (HOSPITAL_COMMUNITY)
Admission: EM | Admit: 2013-08-10 | Discharge: 2013-08-10 | Disposition: A | Discharge status: Home / Self Care | Payer: Medicaid Other | Attending: Emergency Medicine | Admitting: Emergency Medicine

## 2013-08-10 DIAGNOSIS — R6889 Other general symptoms and signs: Secondary | ICD-10-CM

## 2013-08-10 DIAGNOSIS — T148XXA Other injury of unspecified body region, initial encounter: Secondary | ICD-10-CM

## 2013-08-10 LAB — BASIC METABOLIC PANEL
BUN: 9 mg/dL (ref 6–23)
CHLORIDE: 98 meq/L (ref 96–112)
CO2: 26 meq/L (ref 19–32)
Calcium: 9.4 mg/dL (ref 8.4–10.5)
Creatinine, Ser: 0.58 mg/dL (ref 0.50–1.10)
GFR calc non Af Amer: >90 mL/min (ref >90)
Glucose, Bld: 88 mg/dL (ref 70–99)
Potassium: 3.9 mEq/L (ref 3.7–5.3)
Sodium: 136 mEq/L — ABNORMAL LOW (ref 137–147)

## 2013-08-10 LAB — CBC
HCT: 35.8 % — ABNORMAL LOW (ref 36.0–46.0)
Hemoglobin: 13.1 g/dL (ref 12.0–15.0)
MCH: 27.6 pg (ref 26.0–34.0)
MCHC: 36.6 g/dL — ABNORMAL HIGH (ref 30.0–36.0)
MCV: 75.4 fL — AB (ref 78.0–100.0)
PLATELETS: 385 10*3/uL (ref 150–400)
RBC: 4.75 MIL/uL (ref 3.87–5.11)
RDW: 13.4 % (ref 11.5–15.5)
WBC: 8.3 10*3/uL (ref 4.0–10.5)

## 2013-08-10 LAB — URINALYSIS, ROUTINE W REFLEX MICROSCOPIC
Bilirubin Urine: NEGATIVE
GLUCOSE, UA: NEGATIVE mg/dL
Hgb urine dipstick: NEGATIVE
KETONES UR: NEGATIVE mg/dL
LEUKOCYTES UA: NEGATIVE
NITRITE: NEGATIVE
PH: 6.5 (ref 5.0–8.0)
Protein, ur: NEGATIVE mg/dL
Specific Gravity, Urine: 1.007 (ref 1.005–1.030)
Urobilinogen, UA: 0.2 mg/dL (ref 0.0–1.0)

## 2013-08-10 LAB — PREGNANCY, URINE: PREG TEST UR: NEGATIVE

## 2013-08-10 LAB — TSH: TSH: 1.085 u[IU]/mL (ref 0.350–4.500)

## 2013-08-10 MED ORDER — METHOCARBAMOL 500 MG PO TABS
1000.0000 mg | ORAL_TABLET | Freq: Once | ORAL | Status: AC
  Administered 2013-08-10: 1000 mg via ORAL
  Filled 2013-08-10: qty 2

## 2013-08-10 MED ORDER — METHOCARBAMOL 750 MG PO TABS: 750.0000 mg | ORAL_TABLET | Freq: Four times a day (QID) | ORAL | Status: DC

## 2013-08-10 MED ORDER — NAPROXEN 250 MG PO TABS
500.0000 mg | ORAL_TABLET | Freq: Two times a day (BID) | ORAL | Status: DC
  Administered 2013-08-10: 500 mg via ORAL
  Filled 2013-08-10: qty 2

## 2013-08-10 MED ORDER — NAPROXEN 500 MG PO TABS: 500.0000 mg | ORAL_TABLET | Freq: Two times a day (BID) | ORAL | Status: DC

## 2013-08-10 NOTE — ED Notes (Signed)
Pt states that she has been feeling "shaky and chilly" for about a year and has been told to ger thyroid checked but she has not done this. States that 4 days ago she had a muscle cramp in her right shoulder and it has not gone away. States that now she is sore on her right shoulder, right arm, chest and mid back. States that she has taken tylenol w codeine and it sometimes works.

## 2013-08-10 NOTE — ED Provider Notes (Signed)
CSN: [id number]     Arrival date & time 08/09/13  2313 History   First MD Initiated Contact with Patient 08/10/13 0028     Chief Complaint  Patient presents with  . Chest Pain  . Back Pain  . Abdominal Pain  . Knee Pain   (Consider location/radiation/quality/duration/timing/severity/associated sxs/prior Treatment) HPI 33 year old female presents to emergency department with complaint of right shoulder pain that radiates around into her right chest, right lower back.  Patient thinks that she strained her right shoulder when braiding hair recently.  She has taken Tylenol.  #3, which is helped slightly with her pain.  Patient also complaining of being constantly cold.  She reports that when she gets extremely cold, her knees hurt.  This is been ongoing for several years.  She reports weight gain.  She reports she has been told by others.  She should take more iron and get her thyroid checked.  She is requesting a thyroid evaluation in the emergency room tonight. Past Medical History  Diagnosis Date  . Sickle cell trait   . Low back pain   . Chronic headaches    History reviewed. No pertinent past surgical history. No family history on file. History  Substance Use Topics  . Smoking status: Never Smoker   . Smokeless tobacco: Not on file  . Alcohol Use: No   OB History   Grav Para Term Preterm Abortions TAB SAB Ect Mult Living                 Review of Systems  See History of Present Illness; otherwise all other systems are reviewed and negative Allergies  Review of patient's allergies indicates no known allergies.  Home Medications   Current Outpatient Rx  Name  Route  Sig  Dispense  Refill  . acetaminophen-codeine (TYLENOL #3) 300-30 MG per tablet   Oral   Take 1 tablet by mouth every 8 (eight) hours as needed for moderate pain (dental pain).         Marland Kitchen. amoxicillin (AMOXIL) 500 MG capsule   Oral   Take 500 mg by mouth 4 (four) times daily. For seven days for dental  extractions         . Calcium Carbonate-Vitamin D (CALCIUM 600+D) 600-400 MG-UNIT per tablet   Oral   Take 1 tablet by mouth daily.         . Ferrous Sulfate 27 MG TABS   Oral   Take 1 tablet by mouth daily.          BP 98/64  Pulse 59  Temp(Src) 98 F (36.7 C) (Oral)  Resp 17  Ht 5\' 4"  (1.626 m)  Wt 204 lb (92.534 kg)  BMI 35.00 kg/m2  SpO2 100%  LMP 07/25/2013 Physical Exam  Nursing note and vitals reviewed. Constitutional: She is oriented to person, place, and time. She appears well-developed and well-nourished. No distress.  HENT:  Head: Normocephalic and atraumatic.  Nose: Nose normal.  Mouth/Throat: Oropharynx is clear and moist.  Eyes: Conjunctivae and EOM are normal. Pupils are equal, round, and reactive to light.  Neck: Normal range of motion. Neck supple. No JVD present. No tracheal deviation present. No thyromegaly present.  No thyroid enlargement noted  Cardiovascular: Normal rate, regular rhythm, normal heart sounds and intact distal pulses.  Exam reveals no gallop and no friction rub.   No murmur heard. Pulmonary/Chest: Effort normal and breath sounds normal. No stridor. No respiratory distress. She has no wheezes. She has no  rales. She exhibits no tenderness.  Abdominal: Soft. Bowel sounds are normal. She exhibits no distension and no mass. There is no tenderness. There is no rebound and no guarding.  Musculoskeletal: Normal range of motion. She exhibits tenderness. She exhibits no edema.  Patient has tenderness to palpation to right trapezius, right anterior chest wall, right mid back.  Movement and palpation of these areas reproduces pain.  Knees examined.  No effusion, crepitus, or pain with movement  Lymphadenopathy:    She has no cervical adenopathy.  Neurological: She is alert and oriented to person, place, and time. She has normal reflexes. No cranial nerve deficit. She exhibits normal muscle tone. Coordination normal.  Skin: Skin is warm and dry.  No rash noted. No erythema. No pallor.  Psychiatric: She has a normal mood and affect. Her behavior is normal. Judgment and thought content normal.    ED Course  Procedures (including critical care time) Labs Review Labs Reviewed  CBC - Abnormal; Notable for the following:    HCT 35.8 (*)    MCV 75.4 (*)    MCHC 36.6 (*)    All other components within normal limits  BASIC METABOLIC PANEL - Abnormal; Notable for the following:    Sodium 136 (*)    All other components within normal limits  URINALYSIS, ROUTINE W REFLEX MICROSCOPIC  PREGNANCY, URINE  TSH  POCT I-STAT TROPONIN I   Imaging Review No results found.  EKG Interpretation    Date/Time:  Tuesday August 09 2013 23:33:41 EST Ventricular Rate:  67 PR Interval:  136 QRS Duration: 82 QT Interval:  380 QTC Calculation: 401 R Axis:   23 Text Interpretation:  Normal sinus rhythm Cannot rule out Anterior infarct , age undetermined Abnormal ECG No old tracing to compare Confirmed by Jakaiden Fill  MD, Jefrey Raburn ([id number]) on 08/09/2013 11:44:11 PM            MDM   1. Muscle strain   2. Cold intolerance    33 year old female with muscle strain.  Patient advised we cannot do evaluation for thyroid, probably in the emergency department.  I will have a TSH drawn.  She is to followup with local.  Primary care Dr. for further workup.    Olivia Mackie, MD 08/10/13 0130

## 2013-08-10 NOTE — Discharge Instructions (Signed)
Muscle Strain A muscle strain is an injury that occurs when a muscle is stretched beyond its normal length. Usually a small number of muscle fibers are torn when this happens. Muscle strain is rated in degrees. First-degree strains have the least amount of muscle fiber tearing and pain. Second-degree and third-degree strains have increasingly more tearing and pain.  Usually, recovery from muscle strain takes 1 2 weeks. Complete healing takes 5 6 weeks.  CAUSES  Muscle strain happens when a sudden, violent force placed on a muscle stretches it too far. This may occur with lifting, sports, or a fall.  RISK FACTORS Muscle strain is especially common in athletes.  SIGNS AND SYMPTOMS At the site of the muscle strain, there may be:  Pain.  Bruising.  Swelling.  Difficulty using the muscle due to pain or lack of normal function. DIAGNOSIS  Your health care provider will perform a physical exam and ask about your medical history. TREATMENT  Often, the best treatment for a muscle strain is resting, icing, and applying cold compresses to the injured area.  HOME CARE INSTRUCTIONS   Use the PRICE method of treatment to promote muscle healing during the first 2 3 days after your injury. The PRICE method involves:  Protecting the muscle from being injured again.  Restricting your activity and resting the injured body part.  Icing your injury. To do this, put ice in a plastic bag. Place a towel between your skin and the bag. Then, apply the ice and leave it on from 15 20 minutes each hour. After the third day, switch to moist heat packs.  Apply compression to the injured area with a splint or elastic bandage. Be careful not to wrap it too tightly. This may interfere with blood circulation or increase swelling.  Elevate the injured body part above the level of your heart as often as you can.  Only take over-the-counter or prescription medicines for pain, discomfort, or fever as directed by your  health care provider.  Warming up prior to exercise helps to prevent future muscle strains. SEEK MEDICAL CARE IF:   You have increasing pain or swelling in the injured area.  You have numbness, tingling, or a significant loss of strength in the injured area. MAKE SURE YOU:   Understand these instructions.  Will watch your condition.  Will get help right away if you are not doing well or get worse. Document Released: 06/30/2005 Document Revised: 04/20/2013 Document Reviewed: 01/27/2013 [facility] Patient Information 2014 [place], [place].  Musculoskeletal Pain Musculoskeletal pain is muscle and boney aches and pains. These pains can occur in any part of the body. Your caregiver may treat you without knowing the cause of the pain. They may treat you if blood or urine tests, X-rays, and other tests were normal.  CAUSES There is often not a definite cause or reason for these pains. These pains may be caused by a type of germ (virus). The discomfort may also come from overuse. Overuse includes working out too hard when your body is not fit. Boney aches also come from weather changes. Bone is sensitive to atmospheric pressure changes. HOME CARE INSTRUCTIONS   Ask when your test results will be ready. Make sure you get your test results.  Only take over-the-counter or prescription medicines for pain, discomfort, or fever as directed by your caregiver. If you were given medications for your condition, do not drive, operate machinery or power tools, or sign legal documents for 24 hours. Do not drink alcohol.  Do not take sleeping pills or other medications that may interfere with treatment.  Continue all activities unless the activities cause more pain. When the pain lessens, slowly resume normal activities. Gradually increase the intensity and duration of the activities or exercise.  During periods of severe pain, bed rest may be helpful. Lay or sit in any position that is comfortable.  Putting  ice on the injured area.  Put ice in a bag.  Place a towel between your skin and the bag.  Leave the ice on for 15 to 20 minutes, 3 to 4 times a day.  Follow up with your caregiver for continued problems and no reason can be found for the pain. If the pain becomes worse or does not go away, it may be necessary to repeat tests or do additional testing. Your caregiver may need to look further for a possible cause. SEEK IMMEDIATE MEDICAL CARE IF:  You have pain that is getting worse and is not relieved by medications.  You develop chest pain that is associated with shortness or breath, sweating, feeling sick to your stomach (nauseous), or throw up (vomit).  Your pain becomes localized to the abdomen.  You develop any new symptoms that seem different or that concern you. MAKE SURE YOU:   Understand these instructions.  Will watch your condition.  Will get help right away if you are not doing well or get worse. Document Released: 06/30/2005 Document Revised: 09/22/2011 Document Reviewed: 03/04/2013 [facility] Patient Information 2014 [place], [place].  If you were a [facility] patient or do not have insurance, here are some clinics in our community that may be able to provide care for free or on sliding payment scales.  [facility], [address], [place], [phone]; Monday to Friday, 9 a.m. - 7 p.m.; Saturday 9 a.m. to 1 p.m.   [facility], [address], [place]; [phone]; or [address],  [place]; [phone].    [facility] [place], [place]. [address], [place]; [phone]; Monday to Wednesday, 8:30 a.m. - 5 p.m.; Thursday, 8:30 a.m. - 8 p.m.   [facility], [address], [place]; [phone]; Monday to Friday, 8 a.m. - 4:30 p.m.    [facility], [place] [address], [place], [phone]; first and third Saturday of the  month, 9:30 a.m. - 12:30  p.m.   [facility], [address] [place], [phone]; second Saturday of the month, 9 a.m. -noon.   RESOURCE GUIDE  Dental Problems  Patients with Medicaid: [facility] [phone]                                                                   [phone] [address] [employer] Phone:  [phone]  Phone:  [phone]  If unable to pay or uninsured, contact:  [facility] or [facility] to become qualified for the adult dental clinic.  Chronic Pain Problems Contact [facility]  [phone] Patients need to be referred by their primary care doctor.  Insufficient Money for Medicine Contact [employer]:  call "211" or [facility] [phone].  No Primary Care Doctor Call [facility]  [phone] Other agencies that provide inexpensive medical care    [facility]  [phone]    [facility]  [phone]    [facility]  [phone]    [facility]  [phone]    [facility]  [phone]  Psychological Services [facility]  [phone] [facility]  [phone] [facility]   [phone] (emergency services [phone])  Abuse/Neglect [facility] Child [facility] Hotline (515) 178-9046(336) (352)148-8405 [facility] Hotline [phone] (After Hours)  Emergency Shelter [facility] [phone]  [facility] [phone]) [phone] [facility] [phone]  MRSA Hotline #:   252 887 8939(971)089-2644  [facility]  [facility]     [employer]                          [facility] [address] [place]                        [address]          [address]  [place]                                                 [place]                            [place] Phone:  [phone]                                     Phone:  [phone]                   Phone:  [phone]  [facility] Phone:  [phone]  [facility] Hotline (504)200-2650(336) (708) [phone] [phone] [phone] (After Hours)   Community Resources: *IF YOU ARE IN IMMEDIATE DANGER CALL 911!  Abuse/Neglect:  Family Services Crisis Hotline [facility]): [phone]) [phone] [facility] Against [facility]([place]): [phone](336) [phone]  After hours, holidays and weekends: ([phone](336) [phone] National Domestic Violence Hotline: (507)636-8307956-172-7692  Mental Health: [facility]: [address]: [phone]  Health Clinics:  [facility]): [phone]) [phone] Monday - Friday 8 AM - 9 PM, Saturday and Sunday 10 AM - 9 PM   [facility]  [address]: [phone] Monday- Friday 8:30 AM - 5:30 PM, Sat 9 AM - 1 PM  24 HR [facility] [address]: [phone] [facility]: [phone] [facility] [address]: [phone]  24 HR [facility]: [address] [phone]

## 2013-08-11 ENCOUNTER — Encounter (HOSPITAL_COMMUNITY): Payer: Self-pay | Admitting: Emergency Medicine

## 2013-08-11 ENCOUNTER — Emergency Department (INDEPENDENT_AMBULATORY_CARE_PROVIDER_SITE_OTHER)
Admission: EM | Admit: 2013-08-11 | Discharge: 2013-08-11 | Disposition: A | Discharge status: Home / Self Care | Payer: Self-pay | Source: Home / Self Care

## 2013-08-11 DIAGNOSIS — R6889 Other general symptoms and signs: Secondary | ICD-10-CM

## 2013-08-11 NOTE — ED Provider Notes (Signed)
CSN: [id number]     Arrival date & time 08/11/13  1159 History   First MD Initiated Contact with Patient 08/11/13 1331     Chief Complaint  Patient presents with  . Thyroid Problem   (Consider location/radiation/quality/duration/timing/severity/associated sxs/prior Treatment) HPI Comments: Pt was seen in the ED yesterday for minor complaint and mentioned she was feeling cold all the time, for several years. The pt requested a thyroid test and a TSH was drawn in the ED and told to f/u with PCP as documented in the chart. She could not get an appointment today and came to the urgent care as she was told by her PCP's office since she wanted to have something done today. No distress and no new symptoms.   Past Medical History  Diagnosis Date  . Sickle cell trait   . Low back pain   . Chronic headaches    History reviewed. No pertinent past surgical history. History reviewed. No pertinent family history. History  Substance Use Topics  . Smoking status: Never Smoker   . Smokeless tobacco: Not on file  . Alcohol Use: No   OB History   Grav Para Term Preterm Abortions TAB SAB Ect Mult Living                 Review of Systems  Constitutional: Positive for activity change.       Feeling cold  HENT: Negative.   Respiratory: Negative.     Allergies  Review of patient's allergies indicates no known allergies.  Home Medications   Current Outpatient Rx  Name  Route  Sig  Dispense  Refill  . acetaminophen-codeine (TYLENOL #3) 300-30 MG per tablet   Oral   Take 1 tablet by mouth every 8 (eight) hours as needed for moderate pain (dental pain).         Marland Kitchen amoxicillin (AMOXIL) 500 MG capsule   Oral   Take 500 mg by mouth 4 (four) times daily. For seven days for dental extractions         . Calcium Carbonate-Vitamin D (CALCIUM 600+D) 600-400 MG-UNIT per tablet   Oral   Take 1 tablet by mouth daily.         . Ferrous Sulfate 27 MG TABS   Oral   Take 1 tablet by mouth daily.         . methocarbamol (ROBAXIN-750) 750 MG tablet   Oral   Take 1 tablet (750 mg total) by mouth 4 (four) times daily.   40 tablet   0   . naproxen (NAPROSYN) 500 MG tablet   Oral   Take 1 tablet (500 mg total) by mouth 2 (two) times daily.   30 tablet   0    BP 116/82  Pulse 72  Temp(Src) 98.1 F (36.7 C) (Oral)  Resp 16  SpO2 98%  LMP 07/25/2013 Physical Exam  Nursing note and vitals reviewed. Constitutional: She is oriented to person, place, and time. She appears well-developed and well-nourished. No distress.  Eyes: EOM are normal.  Cardiovascular: Normal rate.   Pulmonary/Chest: Effort normal. No respiratory distress.  Musculoskeletal: She exhibits no edema.  Neurological: She is alert and oriented to person, place, and time.  Psychiatric: She has a normal mood and affect.    ED Course  Procedures (including critical care time) Labs Review Labs Reviewed - No data to display Imaging Review No results found.    Results for orders placed during the hospital encounter of 08/10/13  CBC  Result Value Range   WBC 8.3  4.0 - 10.5 K/uL   RBC 4.75  3.87 - 5.11 MIL/uL   Hemoglobin 13.1  12.0 - 15.0 g/dL   HCT 16.135.8 (*) 09.636.0 - 04.546.0 %   MCV 75.4 (*) 78.0 - 100.0 fL   MCH 27.6  26.0 - 34.0 pg   MCHC 36.6 (*) 30.0 - 36.0 g/dL   RDW 40.913.4  81.111.5 - 91.415.5 %   Platelets 385  150 - 400 K/uL  BASIC METABOLIC PANEL      Result Value Range   Sodium 136 (*) 137 - 147 mEq/L   Potassium 3.9  3.7 - 5.3 mEq/L   Chloride 98  96 - 112 mEq/L   CO2 26  19 - 32 mEq/L   Glucose, Bld 88  70 - 99 mg/dL   BUN 9  6 - 23 mg/dL   Creatinine, Ser 7.820.58  0.50 - 1.10 mg/dL   Calcium 9.4  8.4 - 95.610.5 mg/dL   GFR calc non Af Amer >90  >90 mL/min   GFR calc Af Amer >90  >90 mL/min  URINALYSIS, ROUTINE W REFLEX MICROSCOPIC      Result Value Range   Color, Urine YELLOW  YELLOW   APPearance CLEAR  CLEAR   Specific Gravity, Urine 1.007  1.005 - 1.030   pH 6.5  5.0 - 8.0   Glucose, UA  NEGATIVE  NEGATIVE mg/dL   Hgb urine dipstick NEGATIVE  NEGATIVE   Bilirubin Urine NEGATIVE  NEGATIVE   Ketones, ur NEGATIVE  NEGATIVE mg/dL   Protein, ur NEGATIVE  NEGATIVE mg/dL   Urobilinogen, UA 0.2  0.0 - 1.0 mg/dL   Nitrite NEGATIVE  NEGATIVE   Leukocytes, UA NEGATIVE  NEGATIVE  PREGNANCY, URINE      Result Value Range   Preg Test, Ur NEGATIVE  NEGATIVE  TSH      Result Value Range   TSH 1.085  0.350 - 4.500 uIU/mL  POCT I-STAT TROPONIN I      Result Value Range   Troponin i, poc 0.00  0.00 - 0.08 ng/mL   Comment 3              MDM   1. Sensation of feeling cold     TSH WNL Will need to see your PCP for evaluation of feeling cold  Hayden Rasmussenavid Elin Seats, NP 08/11/13 1426

## 2013-08-11 NOTE — ED Provider Notes (Signed)
Medical screening examination/treatment/procedure(s) were performed by non-physician practitioner and as supervising physician I was immediately available for consultation/collaboration.  Suhey Radford, M.D.   Kathyann Spaugh C Ashrith Sagan, MD 08/11/13 1901 

## 2013-08-11 NOTE — ED Notes (Signed)
C/o thyroid problem States she was at hospital and was told to come here for TSH results and treatement

## 2013-08-11 NOTE — Discharge Instructions (Signed)
Followup with your primary care doctor said that he or she may obtain a full history, perform a proper physical and obtain additional lab work necessary to diagnose her problem.

## 2013-08-29 ENCOUNTER — Encounter: Payer: Self-pay | Admitting: Internal Medicine

## 2013-08-29 ENCOUNTER — Ambulatory Visit: Payer: Self-pay | Attending: Internal Medicine | Admitting: Internal Medicine

## 2013-08-29 VITALS — BP 139/85 | HR 82 | Temp 98.9°F | Resp 14 | Ht 64.0 in | Wt 206.8 lb

## 2013-08-29 DIAGNOSIS — Z0000 Encounter for general adult medical examination without abnormal findings: Secondary | ICD-10-CM

## 2013-08-29 DIAGNOSIS — M545 Low back pain, unspecified: Secondary | ICD-10-CM | POA: Insufficient documentation

## 2013-08-29 DIAGNOSIS — R635 Abnormal weight gain: Secondary | ICD-10-CM | POA: Insufficient documentation

## 2013-08-29 DIAGNOSIS — M79609 Pain in unspecified limb: Secondary | ICD-10-CM | POA: Insufficient documentation

## 2013-08-29 DIAGNOSIS — D571 Sickle-cell disease without crisis: Secondary | ICD-10-CM | POA: Insufficient documentation

## 2013-08-29 DIAGNOSIS — R52 Pain, unspecified: Secondary | ICD-10-CM | POA: Insufficient documentation

## 2013-08-29 LAB — COMPLETE METABOLIC PANEL WITH GFR
ALBUMIN: 3.5 g/dL (ref 3.5–5.2)
ALT: 8 U/L (ref 0–35)
AST: 12 U/L (ref 0–37)
Alkaline Phosphatase: 65 U/L (ref 39–117)
BUN: 11 mg/dL (ref 6–23)
CALCIUM: 8.7 mg/dL (ref 8.4–10.5)
CO2: 27 meq/L (ref 19–32)
Chloride: 105 mEq/L (ref 96–112)
Creat: 0.53 mg/dL (ref 0.50–1.10)
Glucose, Bld: 84 mg/dL (ref 70–99)
POTASSIUM: 3.8 meq/L (ref 3.5–5.3)
SODIUM: 138 meq/L (ref 135–145)
TOTAL PROTEIN: 6.4 g/dL (ref 6.0–8.3)
Total Bilirubin: 0.5 mg/dL (ref 0.2–1.2)

## 2013-08-29 LAB — LIPID PANEL
CHOL/HDL RATIO: 3.9 ratio
Cholesterol: 173 mg/dL (ref 0–200)
HDL: 44 mg/dL (ref >39)
LDL CALC: 94 mg/dL (ref 0–99)
Triglycerides: 175 mg/dL — ABNORMAL HIGH (ref <150)
VLDL: 35 mg/dL (ref 0–40)

## 2013-08-29 LAB — CBC WITH DIFFERENTIAL/PLATELET
BASOS PCT: 1 % (ref 0–1)
Basophils Absolute: 0.1 10*3/uL (ref 0.0–0.1)
Eosinophils Absolute: 0.4 10*3/uL (ref 0.0–0.7)
Eosinophils Relative: 7 % — ABNORMAL HIGH (ref 0–5)
HEMATOCRIT: 34.3 % — AB (ref 36.0–46.0)
HEMOGLOBIN: 11.9 g/dL — AB (ref 12.0–15.0)
LYMPHS ABS: 2.2 10*3/uL (ref 0.7–4.0)
Lymphocytes Relative: 40 % (ref 12–46)
MCH: 27.2 pg (ref 26.0–34.0)
MCHC: 34.7 g/dL (ref 30.0–36.0)
MCV: 78.5 fL (ref 78.0–100.0)
MONO ABS: 0.3 10*3/uL (ref 0.1–1.0)
MONOS PCT: 5 % (ref 3–12)
NEUTROS PCT: 47 % (ref 43–77)
Neutro Abs: 2.6 10*3/uL (ref 1.7–7.7)
Platelets: 363 10*3/uL (ref 150–400)
RBC: 4.37 MIL/uL (ref 3.87–5.11)
RDW: 14.1 % (ref 11.5–15.5)
WBC: 5.5 10*3/uL (ref 4.0–10.5)

## 2013-08-29 LAB — VITAMIN B12: Vitamin B-12: 868 pg/mL (ref 211–911)

## 2013-08-29 LAB — T3, FREE: T3 FREE: 3.3 pg/mL (ref 2.3–4.2)

## 2013-08-29 LAB — POCT GLYCOSYLATED HEMOGLOBIN (HGB A1C): Hemoglobin A1C: 4.8

## 2013-08-29 LAB — T4, FREE: Free T4: 0.96 ng/dL (ref 0.80–1.80)

## 2013-08-29 LAB — TSH: TSH: 0.543 u[IU]/mL (ref 0.350–4.500)

## 2013-08-29 NOTE — Progress Notes (Signed)
Patient ID: Anna Walls, female   DOB: Mar 17, 1981, 33 y.o.   MRN: [id number]   CC:  HPI: 33 year old female, presents to the clinic to establish care. She states that she feels cold all the time mostly in her hands and her feet. She was in the ED on 08/11/13 revaluation of her symptoms and was found to have normal thyroid function. She has an IUD in place for the last 3 years but despite that the patient has a period every 3 weeks which is described as heavy. She has sickle cell trait but is nonanemic. Occasionally she gets heartburn for which she takes Prilosec every now and then.  Social history nonsmoker nonalcoholic.  Family history no family history of sickle cell disease, mother has occasional heartburn  No Known Allergies Past Medical History  Diagnosis Date  . Sickle cell trait   . Low back pain   . Chronic headaches    Current Outpatient Prescriptions on File Prior to Visit  Medication Sig Dispense Refill  . acetaminophen-codeine (TYLENOL #3) 300-30 MG per tablet Take 1 tablet by mouth every 8 (eight) hours as needed for moderate pain (dental pain).      Marland Kitchen amoxicillin (AMOXIL) 500 MG capsule Take 500 mg by mouth 4 (four) times daily. For seven days for dental extractions      . Calcium Carbonate-Vitamin D (CALCIUM 600+D) 600-400 MG-UNIT per tablet Take 1 tablet by mouth daily.      . Ferrous Sulfate 27 MG TABS Take 1 tablet by mouth daily.      . methocarbamol (ROBAXIN-750) 750 MG tablet Take 1 tablet (750 mg total) by mouth 4 (four) times daily.  40 tablet  0   No current facility-administered medications on file prior to visit.   History reviewed. No pertinent family history. History   Social History  . Marital Status: Married    Spouse Name: N/A    Number of Children: N/A  . Years of Education: N/A   Occupational History  . Not on file.   Social History Main Topics  . Smoking status: Never Smoker   . Smokeless tobacco: Not on file  . Alcohol Use: No  . Drug  Use: No  . Sexual Activity: Yes   Other Topics Concern  . Not on file   Social History Narrative  . No narrative on file    Review of Systems  Constitutional: As in history of present illness HENT: Negative for ear pain, nosebleeds, congestion, facial swelling, rhinorrhea, neck pain, neck stiffness and ear discharge.   Eyes: Negative for pain, discharge, redness, itching and visual disturbance.  Respiratory: Negative for cough, choking, chest tightness, shortness of breath, wheezing and stridor.   Cardiovascular: Negative for chest pain, palpitations and leg swelling.  Gastrointestinal: Negative for abdominal distention.  Genitourinary: Negative for dysuria, urgency, frequency, hematuria, flank pain, decreased urine volume, difficulty urinating and dyspareunia.  Musculoskeletal: Negative for back pain, joint swelling, arthralgias and gait problem.  Neurological: Negative for dizziness, tremors, seizures, syncope, facial asymmetry, speech difficulty, weakness, light-headedness, numbness and headaches.  Hematological: Negative for adenopathy. Does not bruise/bleed easily.  Psychiatric/Behavioral: Negative for hallucinations, behavioral problems, confusion, dysphoric mood, decreased concentration and agitation.    Objective:   Filed Vitals:   08/29/13 0952  BP: 139/85  Pulse: 82  Temp: 98.9 F (37.2 C)  Resp: 14    Physical Exam  Constitutional: Appears well-developed and well-nourished. No distress.  HENT: Normocephalic. External right and left ear normal. Oropharynx is clear and  moist.  Eyes: Conjunctivae and EOM are normal. PERRLA, no scleral icterus.  Neck: Normal ROM. Neck supple. No JVD. No tracheal deviation. No thyromegaly.  CVS: RRR, S1/S2 +, no murmurs, no gallops, no carotid bruit.  Pulmonary: Effort and breath sounds normal, no stridor, rhonchi, wheezes, rales.  Abdominal: Soft. BS +,  no distension, tenderness, rebound or guarding.  Musculoskeletal: Normal range  of motion. No edema and no tenderness.  Lymphadenopathy: No lymphadenopathy noted, cervical, inguinal. Neuro: Alert. Normal reflexes, muscle tone coordination. No cranial nerve deficit. Skin: Skin is warm and dry. No rash noted. Not diaphoretic. No erythema. No pallor.  Psychiatric: Normal mood and affect. Behavior, judgment, thought content normal.   Lab Results  Component Value Date   WBC 8.3 08/09/2013   HGB 13.1 08/09/2013   HCT 35.8* 08/09/2013   MCV 75.4* 08/09/2013   PLT 385 08/09/2013   Lab Results  Component Value Date   CREATININE 0.58 08/09/2013   BUN 9 08/09/2013   NA 136* 08/09/2013   K 3.9 08/09/2013   CL 98 08/09/2013   CO2 26 08/09/2013    No results found for this basename: HGBA1C   Lipid Panel  No results found for this basename: chol, trig, hdl, cholhdl, vldl, ldlcalc       Assessment and plan:   There are no active problems to display for this patient.  Cold hands and feet No history of lupus Doubt raynaud's phenomenon Will check ANA, double-stranded DNA Check CBC thyroid function   Establish care Patient continues to have heavy menstrual bleeding therefore she will be referred to gynecology, also needs a Pap smear Baseline labs Follow up in 3 months         The patient was given clear instructions to go to ER or return to medical center if symptoms don't improve, worsen or new problems develop. The patient verbalized understanding. The patient was told to call to get any lab results if not heard anything in the next week.

## 2013-08-29 NOTE — Progress Notes (Signed)
Patient is here to establish care. Requests blood work for thyroid. Complains of body aches in bilateral knees and hands x2 years. Worsens with cold weather. Also complains of excessive weight gain.

## 2013-08-30 LAB — VITAMIN D 25 HYDROXY (VIT D DEFICIENCY, FRACTURES): Vit D, 25-Hydroxy: 28 ng/mL — ABNORMAL LOW (ref 30–89)

## 2013-08-30 LAB — ANTI-DNA ANTIBODY, DOUBLE-STRANDED: ds DNA Ab: <1 IU/mL

## 2013-08-30 LAB — ANA: ANA: NEGATIVE

## 2013-08-30 LAB — CYCLIC CITRUL PEPTIDE ANTIBODY, IGG: Cyclic Citrullin Peptide Ab: <2 U/mL (ref 0.0–5.0)

## 2013-09-02 ENCOUNTER — Telehealth: Payer: Self-pay | Admitting: *Deleted

## 2013-09-02 NOTE — Telephone Encounter (Signed)
Call completed as followed. 

## 2013-09-02 NOTE — Telephone Encounter (Signed)
Message copied by Alishba [place], [place] on Fri Sep 02, 2013  9:22 AM ------      Message from: Susie CassetteABROL MD, [facility]YA[facility]      Created: Thu Sep 01, 2013 10:02 AM       Notify patient of the labs are normal with the exception of vitamin D which is mildly low. Advised patient to start taking vitamin D 2000 international units over-the-counter twice a day. ------

## 2014-12-24 ENCOUNTER — Emergency Department (HOSPITAL_COMMUNITY)
Admission: EM | Admit: 2014-12-24 | Discharge: 2014-12-24 | Disposition: A | Discharge status: Home / Self Care | Payer: Self-pay | Attending: Emergency Medicine | Admitting: Emergency Medicine

## 2014-12-24 ENCOUNTER — Encounter (HOSPITAL_COMMUNITY): Payer: Self-pay

## 2014-12-24 ENCOUNTER — Emergency Department (HOSPITAL_COMMUNITY)
Admission: EM | Admit: 2014-12-24 | Discharge: 2014-12-25 | Disposition: A | Discharge status: Home / Self Care | Payer: Self-pay | Attending: Emergency Medicine | Admitting: Emergency Medicine

## 2014-12-24 ENCOUNTER — Encounter (HOSPITAL_COMMUNITY): Payer: Self-pay | Admitting: Emergency Medicine

## 2014-12-24 DIAGNOSIS — R079 Chest pain, unspecified: Secondary | ICD-10-CM | POA: Insufficient documentation

## 2014-12-24 DIAGNOSIS — D573 Sickle-cell trait: Secondary | ICD-10-CM | POA: Insufficient documentation

## 2014-12-24 DIAGNOSIS — Z634 Disappearance and death of family member: Secondary | ICD-10-CM

## 2014-12-24 DIAGNOSIS — F430 Acute stress reaction: Secondary | ICD-10-CM

## 2014-12-24 DIAGNOSIS — Z79899 Other long term (current) drug therapy: Secondary | ICD-10-CM | POA: Insufficient documentation

## 2014-12-24 DIAGNOSIS — R000 Tachycardia, unspecified: Secondary | ICD-10-CM | POA: Insufficient documentation

## 2014-12-24 DIAGNOSIS — F4320 Adjustment disorder, unspecified: Secondary | ICD-10-CM | POA: Insufficient documentation

## 2014-12-24 DIAGNOSIS — F4321 Adjustment disorder with depressed mood: Secondary | ICD-10-CM

## 2014-12-24 DIAGNOSIS — F419 Anxiety disorder, unspecified: Secondary | ICD-10-CM | POA: Insufficient documentation

## 2014-12-24 DIAGNOSIS — G8929 Other chronic pain: Secondary | ICD-10-CM | POA: Insufficient documentation

## 2014-12-24 DIAGNOSIS — R51 Headache: Secondary | ICD-10-CM | POA: Insufficient documentation

## 2014-12-24 DIAGNOSIS — F439 Reaction to severe stress, unspecified: Secondary | ICD-10-CM | POA: Insufficient documentation

## 2014-12-24 DIAGNOSIS — Z862 Personal history of diseases of the blood and blood-forming organs and certain disorders involving the immune mechanism: Secondary | ICD-10-CM | POA: Insufficient documentation

## 2014-12-24 MED ORDER — IBUPROFEN 800 MG PO TABS
800.0000 mg | ORAL_TABLET | Freq: Once | ORAL | Status: AC
  Administered 2014-12-24: 800 mg via ORAL
  Filled 2014-12-24: qty 1

## 2014-12-24 MED ORDER — LORAZEPAM 1 MG PO TABS: 1.0000 mg | ORAL_TABLET | Freq: Two times a day (BID) | ORAL | Status: DC

## 2014-12-24 MED ORDER — LORAZEPAM 1 MG PO TABS
1.0000 mg | ORAL_TABLET | Freq: Once | ORAL | Status: AC
  Administered 2014-12-24: 1 mg via ORAL
  Filled 2014-12-24: qty 1
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
## 2014-12-24 NOTE — ED Notes (Signed)
Pt continues to rest in room, husband at bedside

## 2014-12-24 NOTE — ED Notes (Signed)
Family at the bedside.

## 2014-12-24 NOTE — Discharge Instructions (Signed)
1. Medications: Ativan, usual home medications 2. Treatment: rest, drink plenty of fluids,  3. Follow Up: Please followup with your primary doctor in 2-3 days for discussion of your diagnoses and further evaluation after today's visit; if you do not have a primary care doctor use the resource guide provided to find one; Please return to the ER for worsening symptoms    Panic Attacks Panic attacks are sudden, short-livedsurges of severe anxiety, fear, or discomfort. They may occur for no reason when you are relaxed, when you are anxious, or when you are sleeping. Panic attacks may occur for a number of reasons:   Healthy people occasionally have panic attacks in extreme, life-threatening situations, such as war or natural disasters. Normal anxiety is a protective mechanism of the body that helps [place] react to danger (fight or flight response).  Panic attacks are often seen with anxiety disorders, such as panic disorder, social anxiety disorder, generalized anxiety disorder, and phobias. Anxiety disorders cause excessive or uncontrollable anxiety. They may interfere with your relationships or other life activities.  Panic attacks are sometimes seen with other mental illnesses, such as depression and posttraumatic stress disorder.  Certain medical conditions, prescription medicines, and drugs of abuse can cause panic attacks. SYMPTOMS  Panic attacks start suddenly, peak within 20 minutes, and are accompanied by four or more of the following symptoms:  Pounding heart or fast heart rate (palpitations).  Sweating.  Trembling or shaking.  Shortness of breath or feeling smothered.  Feeling choked.  Chest pain or discomfort.  Nausea or strange feeling in your stomach.  Dizziness, light-headedness, or feeling like you will faint.  Chills or hot flushes.  Numbness or tingling in your lips or hands and feet.  Feeling that things are not real or feeling that you are not yourself.  Fear of  losing control or going crazy.  Fear of dying. Some of these symptoms can mimic serious medical conditions. For example, you may think you are having a heart attack. Although panic attacks can be very scary, they are not life threatening. DIAGNOSIS  Panic attacks are diagnosed through an assessment by your health care provider. Your health care provider will ask questions about your symptoms, such as where and when they occurred. Your health care provider will also ask about your medical history and use of alcohol and drugs, including prescription medicines. Your health care provider may order blood tests or other studies to rule out a serious medical condition. Your health care provider may refer you to a mental health professional for further evaluation. TREATMENT   Most healthy people who have one or two panic attacks in an extreme, life-threatening situation will not require treatment.  The treatment for panic attacks associated with anxiety disorders or other mental illness typically involves counseling with a mental health professional, medicine, or a combination of both. Your health care provider will help determine what treatment is best for you.  Panic attacks due to physical illness usually go away with treatment of the illness. If prescription medicine is causing panic attacks, talk with your health care provider about stopping the medicine, decreasing the dose, or substituting another medicine.  Panic attacks due to alcohol or drug abuse go away with abstinence. Some adults need professional help in order to stop drinking or using drugs. HOME CARE INSTRUCTIONS   Take all medicines as directed by your health care provider.   Schedule and attend follow-up visits as directed by your health care provider. It is important to keep  all your appointments. SEEK MEDICAL CARE IF:  You are not able to take your medicines as prescribed.  Your symptoms do not improve or get worse. SEEK  IMMEDIATE MEDICAL CARE IF:   You experience panic attack symptoms that are different than your usual symptoms.  You have serious thoughts about hurting yourself or others.  You are taking medicine for panic attacks and have a serious side effect. MAKE SURE YOU:  Understand these instructions.  Will watch your condition.  Will get help right away if you are not doing well or get worse. Document Released: 06/30/2005 Document Revised: 07/05/2013 Document Reviewed: 02/11/2013 [facility] Patient Information 2015 [place], [place]. This information is not intended to replace advice given to you by your health care provider. Make sure you discuss any questions you have with your health care provider.    Emergency Department Resource Guide 1) Find a Doctor and Pay Out of Pocket Although you won't have to find out who is covered by your insurance plan, it is a good idea to ask around and get recommendations. You will then need to call the office and see if the doctor you have chosen will accept you as a new patient and what types of options they offer for patients who are self-pay. Some doctors offer discounts or will set up payment plans for their patients who do not have insurance, but you will need to ask so you aren't surprised when you get to your appointment.  2) Contact Your Local Health Department Not all health departments have doctors that can see patients for sick visits, but many do, so it is worth a call to see if yours does. If you don't know where your local health department is, you can check in your phone book. The CDC also has a tool to help you locate your state's health department, and many state websites also have listings of all of their local health departments.  3) Find a Walk-in Clinic If your illness is not likely to be very severe or complicated, you may want to try a walk in clinic. These are popping up all over the country in pharmacies, drugstores, and shopping centers.  They're usually staffed by nurse practitioners or physician assistants that have been trained to treat common illnesses and complaints. They're usually fairly quick and inexpensive. However, if you have serious medical issues or chronic medical problems, these are probably not your best option.  No Primary Care Doctor: - Call Health Connect at  [phone] - they can help you locate a primary care doctor that  accepts your insurance, provides certain services, etc. - Physician Referral Service- [phone]  Chronic Pain Problems: Organization         Address  Phone   Notes  [facility]  [phone] Patients need to be referred by their primary care doctor.   Medication Assistance: Organization         Address  Phone   Notes  [facility] Medication [facility] [address] [place] [place], [place] [address] [phone] --Must be a resident of [facility] -- Must have NO insurance coverage whatsoever (no Medicaid/ Medicare, etc.) -- The pt. MUST have a primary care doctor that directs their care regularly and follows them in the community   MedAssist  [phone]   [employer]  [phone]    Agencies that provide inexpensive medical care: Retail buyer  Notes  [place]  [phone]   [facility]    [phone]   [facility] [place], [place] [address] [phone]   [place] [address], [place] [phone]   Planned Parenthood    [phone]   [facility]    [phone]   [place] [address], [place] Phone:  [phone], Fax:  [phone] Hours of Operation:  9 am - 6 pm, M-F.  Also accepts Medicaid/Medicare and self-pay.  [facility] for [place], [address], [place] Phone: [phone], Fax: [phone]. Hours of Operation:  8:30 am - 5:30 pm, M-F.  Also accepts Medicaid and self-pay.  [facility] [place] [address] [place] Phone: [phone]   [place], [place], [place] [phone], Ext. [phone] Mondays & Thursdays: 7-9 AM.  First 15 patients are seen on a first come, first serve basis.    [place] Providers:  Organization         Address  Phone   Notes  [facility] [address], [place] [phone] Also accepts self-pay patients.  [facility] [address], [place]  [phone]   [place], [address], [place] [phone]   [facility] [address], [place] [phone]   Lucianne Lei [address], [place]   [phone] Only accepts [place] Access [place] patients after they have their name applied to their card.   Self-Pay (no insurance) in [facility]:  Organization         Address  Phone   Notes  Sickle Cell Patients, [facility] [place] [phone]   [facility] [place] [phone]   [facility] [place]  [place], [place], [place] [phone]   [facility]/Dr. Osei-Bonsu  [address] [place] or [address], [place] [phone] Phone number for both [place] and [place] locations is the same.  [facility] [address] [place] [phone]   [facility] [address] [place] or [address] [phone]   [facility] [address], [place] [phone], phone; [phone], fax Sees patients 1st and 3rd Saturday of every month.  Must not qualify for public or private insurance (i.e.  Medicaid, Medicare, [place] Health Choice, Veterans' Benefits)  Household income should be no more than 200% of the poverty level The clinic cannot treat you if you are pregnant or think you are pregnant  Sexually transmitted diseases are not treated at the clinic.    Dental Care: Organization         Address  Phone  Notes  [facility] Department of [facility] [phone] Accepts children up to age 13 who are enrolled in [place] or [place]; pregnant women with a Medicaid card; and children who have applied for Medicaid or [place] Health Choice, but were declined, whose parents can pay a reduced fee at time of service.  [facility]  Department of [facility]  [address], [place] [phone] Accepts children up to age 27 who are enrolled in [place] or [place]; pregnant women with a Medicaid card; and children who have applied for Medicaid or  Health Choice, but were declined, whose parents can pay a reduced fee at time of service.  [place] Adult [facility] Access PRO[facility]  [place] [phone] Patients are seen by appointment only. Walk-ins are not accepted. [place] will see patients 68 years of age and older. Monday - Tuesday (8am-5pm) Most Wednesdays (8:30-5pm) $30 per visit, cash only  [facility] Adult Dental Access PROGRAM  [address], [facility] [phone] Patients are seen by appointment only. Walk-ins are not accepted. [place] will see patients 56 years of age and older. One Wednesday Evening (Monthly: Volunteer Based).  $30 per visit, cash only  [place]  [phone] for adults; Children under age 37, call Graduate Pediatric Dentistry at [phone]. Children aged 29-14, please call [phone] to request a pediatric application.  Dental services are provided in all areas of dental care including fillings,  crowns and bridges, complete and partial dentures, implants, gum treatment, root canals, and extractions. Preventive care is also provided. Treatment is provided to both adults and children. Patients are selected via a lottery and there is often a waiting list.   [facility] [address], [place]  [phone] [email or web address]   [facility] [address] [place], [place] [phone], Ext. [phone] Second and Fourth Thursday of each month, opens at 6:30 AM; Clinic ends at 9 AM.  Patients are seen on a first-come first-served basis, and a limited number are seen during each clinic.   [facility]  [address] Hillard Danker [place], [place] [phone]   Eligibility Requirements You must have lived in [place], [place], or [place] counties for at least the last three months.   You cannot be eligible for state or federal sponsored [employer], including [employer], [place], or [employer].   You generally cannot be eligible for healthcare insurance through your employer.    How to apply: Eligibility screenings are held every Tuesday and Wednesday afternoon from 1:00 pm until 4:00 pm. You do not need an appointment for the interview!  [facility] [address], [place], [place]   [place]  [place] Department  [place]  [phone]    Behavioral Health Resources in the Community: Intensive Outpatient Programs Organization         Address  Phone  Notes  [place]. [address] [place], [place] [phone]   [facility] Outpatient [address] [place], [place]   ADS: [facility] Drug [facility] [address] [place], [place]   [place] [address]  [place], [place] or [phone]   Substance Abuse  Resources Organization         Address  Phone  Notes  Alcohol and Drug Services  [phone]   [place]  [phone]   [facility] [place]   Chinita Pester  [phone]   Residential & Outpatient Substance Abuse Program  2046219048   Psychological Services Organization         Address  Phone  Notes  [facility]  [phone]   [facility]  [phone]   [facility] Mental Health [address] [place] [phone] or [phone]    Mobile Crisis Teams Organization         Address  Phone  Notes  [facility], Mobile Crisis Care Unit  [phone]   [facility]  [address] [place], [place] [phone]   Doristine Locks [address] [place] [phone]    Self-Help/Support Groups Organization         Address  Phone             Notes  [facility] [place] - variety of support groups  336- I7437963 Call for more information  Narcotics Anonymous (NA), [facility]aring [facility] [address], [employer] [place]  2 meetings at this location   Statistician         Address  Phone  Notes  [facility] [address],    [place]  [phone]   [facility]  [address] [place] [address], [place], [place] [phone]   [facility] [address] [place], [place] [phone] Admissions: 8am-3pm M-F  [facility] [address]    [place], [place] [phone]   [facility] [address] [place], [place], [place] [phone]   [facility] [address]  [place], [place] [phone]   [facility] - Intensive Outpatient [address], [place], [place] [phone]   [facility] (Addiction [facility] [address] [place].,  [place], [place] [phone] or [phone]   [facility] Services (RTS) [address], [place], [place] [phone] Accepts Medicaid  [facility] [place] [address],  [place] [phone] Substance Abuse/Addiction Treatment   [facility] Organization         Address  Phone  Notes  [facility]  [phone]   Angie Fava, PhD [address] [place], [place]   [phone] or 7164351932   [facility] Behavioral   [address] [place], [place] (808)[phone]   [facility] [address], [place], [place] [phone] Insurance/Medicaid/sponsorship through [facility] and Families [address]                                    [place], [place] [phone] Therapy/tele-psych/case  [facility] [address], [place] [phone]    Dr. Lolly Mustache  [phone]   [facility]  [facility] Dept. 1) [address] [place] 2) [address] [place] 3)  [address], [place] [phone]  [phone]   [facility] Child Abuse Hotline [phone] or [phone] (After Hours)
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
## 2014-12-24 NOTE — ED Notes (Signed)
Pt's husband is here and has her medication from earlier, he just gave her 1mg  of ativan from her bottle

## 2014-12-24 NOTE — ED Notes (Signed)
Husband at bedside; talking with patient in their native language. Patient crying, husband consoling. Began to breathe rapidly, husband able to console her. Respirations 24 at this time.

## 2014-12-24 NOTE — ED Notes (Signed)
TTS gave resources to patient, pt is resting in room until discharge
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
## 2014-12-25 NOTE — Discharge Instructions (Signed)
Grief Reaction Grief is a normal response to the death of someone close to you. Feelings of fear, anger, and guilt can affect almost everyone who loses someone they love. Symptoms of depression are also common. These include problems with sleep, loss of appetite, and lack of energy. These grief reaction symptoms often last for weeks to months after a loss. They may also return during special times that remind you of the person you lost, such as an anniversary or birthday. Anxiety, insomnia, irritability, and deep depression may last beyond the period of normal grief. If you experience these feelings for 6 months or longer, you may have clinical depression. Clinical depression requires further medical attention. If you think that you have clinical depression, you should contact your caregiver. If you have a history of depression or a family history of depression, you are at greater risk of clinical depression. You are also at greater risk of developing clinical depression if the loss was traumatic or the loss was of someone with whom you had unresolved issues.  A grief reaction can become complicated by being blocked. This means being unable to cry or express extreme emotions. This may prolong the grieving period and worsen the emotional effects of the loss. Mourning is a natural event in human life. A healthy grief reaction is one that is not blocked. It requires a time of sadness and readjustment. It is very important to share your sorrow and fear with others, especially close friends and family. Professional counselors and clergy can also help you process your grief. Document Released: 06/30/2005 Document Revised: 11/14/2013 Document Reviewed: 03/10/2006 [facility] Patient Information 2015 [place], [place]. This information is not intended to replace advice given to you by your health care provider. Make sure you discuss any questions you have with your health care provider.  FOLLOW UP WITH THE RESOURCES  PROVIDED TO HELP YOU THROUGH THIS VERY DIFFICULT TIME. USE THE ATIVAN AS DIRECTED. RETURN HERE AS NEEDED.

## 2014-12-25 NOTE — ED Notes (Signed)
Pt still wants to sleep here for a little while longer, states that theres too many people at her house to rest.

## 2015-04-08 ENCOUNTER — Emergency Department (HOSPITAL_COMMUNITY)
Admission: EM | Admit: 2015-04-08 | Discharge: 2015-04-09 | Disposition: A | Discharge status: Home / Self Care | Payer: Self-pay | Attending: Emergency Medicine | Admitting: Emergency Medicine

## 2015-04-08 ENCOUNTER — Encounter (HOSPITAL_COMMUNITY): Payer: Self-pay | Admitting: Emergency Medicine

## 2015-04-08 ENCOUNTER — Emergency Department (HOSPITAL_COMMUNITY): Payer: Self-pay

## 2015-04-08 DIAGNOSIS — G43009 Migraine without aura, not intractable, without status migrainosus: Secondary | ICD-10-CM

## 2015-04-08 DIAGNOSIS — Z792 Long term (current) use of antibiotics: Secondary | ICD-10-CM | POA: Insufficient documentation

## 2015-04-08 DIAGNOSIS — R0981 Nasal congestion: Secondary | ICD-10-CM | POA: Insufficient documentation

## 2015-04-08 DIAGNOSIS — F4320 Adjustment disorder, unspecified: Secondary | ICD-10-CM | POA: Insufficient documentation

## 2015-04-08 DIAGNOSIS — F4321 Adjustment disorder with depressed mood: Secondary | ICD-10-CM

## 2015-04-08 DIAGNOSIS — G43909 Migraine, unspecified, not intractable, without status migrainosus: Secondary | ICD-10-CM | POA: Insufficient documentation

## 2015-04-08 DIAGNOSIS — Z862 Personal history of diseases of the blood and blood-forming organs and certain disorders involving the immune mechanism: Secondary | ICD-10-CM | POA: Insufficient documentation

## 2015-04-08 DIAGNOSIS — Z79899 Other long term (current) drug therapy: Secondary | ICD-10-CM | POA: Insufficient documentation

## 2015-04-08 DIAGNOSIS — G8929 Other chronic pain: Secondary | ICD-10-CM | POA: Insufficient documentation

## 2015-04-08 DIAGNOSIS — R0789 Other chest pain: Secondary | ICD-10-CM | POA: Insufficient documentation

## 2015-04-08 LAB — CBC
HEMATOCRIT: 35.3 % — AB (ref 36.0–46.0)
Hemoglobin: 12.6 g/dL (ref 12.0–15.0)
MCH: 27.3 pg (ref 26.0–34.0)
MCHC: 35.7 g/dL (ref 30.0–36.0)
MCV: 76.4 fL — AB (ref 78.0–100.0)
PLATELETS: 364 10*3/uL (ref 150–400)
RBC: 4.62 MIL/uL (ref 3.87–5.11)
RDW: 13.4 % (ref 11.5–15.5)
WBC: 7.1 10*3/uL (ref 4.0–10.5)

## 2015-04-08 LAB — BASIC METABOLIC PANEL
Anion gap: 5 (ref 5–15)
BUN: 12 mg/dL (ref 6–20)
CHLORIDE: 106 mmol/L (ref 101–111)
CO2: 27 mmol/L (ref 22–32)
Calcium: 8.9 mg/dL (ref 8.9–10.3)
Creatinine, Ser: 0.56 mg/dL (ref 0.44–1.00)
GFR calc non Af Amer: >60 mL/min (ref >60)
Glucose, Bld: 120 mg/dL — ABNORMAL HIGH (ref 65–99)
POTASSIUM: 3.4 mmol/L — AB (ref 3.5–5.1)
SODIUM: 138 mmol/L (ref 135–145)

## 2015-04-08 LAB — I-STAT TROPONIN, ED: Troponin i, poc: 0.01 ng/mL (ref 0.00–0.08)

## 2015-04-08 MED ORDER — DIPHENHYDRAMINE HCL 50 MG/ML IJ SOLN
25.0000 mg | Freq: Once | INTRAMUSCULAR | Status: AC
  Administered 2015-04-09: 25 mg via INTRAVENOUS
  Filled 2015-04-08: qty 1

## 2015-04-08 MED ORDER — KETOROLAC TROMETHAMINE 30 MG/ML IJ SOLN
30.0000 mg | Freq: Once | INTRAMUSCULAR | Status: AC
  Administered 2015-04-09: 30 mg via INTRAVENOUS
  Filled 2015-04-08: qty 1

## 2015-04-08 MED ORDER — METOCLOPRAMIDE HCL 5 MG/ML IJ SOLN
10.0000 mg | Freq: Once | INTRAMUSCULAR | Status: AC
  Administered 2015-04-09: 10 mg via INTRAVENOUS
  Filled 2015-04-08: qty 2

## 2015-04-08 MED ORDER — SODIUM CHLORIDE 0.9 % IV SOLN
1000.0000 mL | Freq: Once | INTRAVENOUS | Status: AC
  Administered 2015-04-09: 1000 mL via INTRAVENOUS

## 2015-04-08 NOTE — ED Notes (Signed)
Pt is c/o chest pain in the center of her chest that radiates over into her left breast   Pt states she started having pain 4 days ago  Pt states her breast is sore  Pt states she feels a little short of breath   Pt also c/o migraine headache that started yesterday  Pt states she gets at least two migraines a week and has a lot of sinus pressure

## 2015-04-08 NOTE — ED Provider Notes (Signed)
CSN: [id number]     Arrival date & time 04/08/15  2157 History   This chart was scribed for Devoria Albe, MD by Arlan Organ, ED Scribe. This patient was seen in room WA06/WA06 and the patient's care was started 11:07 PM.   Chief Complaint  Patient presents with  . Chest Pain   The history is provided by the patient. No language interpreter was used.    HPI Comments: Anna Walls is a 34 y.o. female with a PMHx of sickle cell trait and migraines who presents to the Emergency Department complaining of intermittent, ongoing, unchanged central chest pain that radiates underneath both breast; L greater than R x 4 days. Pain is described as "soreness" and currently rated 10/10. Last episode yesterday and has been constant since yesterday. Discomfort is made worse with deep palpation and when removing her bra. No alleviating factors at this time. Ongoing feeling of chest congestion without cough, dizziness-room spinning, R temporal HA that spreads across her forehead, and nausea also reported. HA is rated 9/10 and radiates across her forehead. Associated photophobia and phonophobia also reported with HA. OTC Tylenol attempted at home without temporary improvement.She describes blurred vision with the headache.  Denies any fever, chills, shortness of breath, diaphoresis, cough, vomiting, or sore throat. No weakness, loss of sensation, or numbness. LNMP last Saturday. [name] uses birth control as contraceptive. She is a Tree surgeon. No known allergies to medications.  When asked if she is under increased stress recently after she reports she used to get headaches about every 3 months and now she gets them weekly, she said "I don't think so". She then stated she recently lost her 82 year old son 3 months ago in a pool accident. States she does not need counseling, states "I know that's what happens in life".   PCP: She is not currently followed by a PCP  Past Medical History  Diagnosis Date  . Sickle  cell trait   . Low back pain   . Chronic headaches    History reviewed. No pertinent past surgical history. History reviewed. No pertinent family history. Social History  Substance Use Topics  . Smoking status: Never Smoker   . Smokeless tobacco: None  . Alcohol Use: No   Employed Lives with spouse and children  OB History    No data available     Review of Systems  Constitutional: Negative for fever, chills and diaphoresis.  HENT: Positive for congestion.   Respiratory: Negative for cough and shortness of breath.   Cardiovascular: Positive for chest pain.  Gastrointestinal: Positive for nausea. Negative for vomiting, abdominal pain and diarrhea.  Musculoskeletal: Negative for back pain.  Skin: Negative for rash.  Neurological: Positive for dizziness and headaches. Negative for weakness and numbness.  Psychiatric/Behavioral: Negative for confusion.  All other systems reviewed and are negative.     Allergies  Review of patient's allergies indicates no known allergies.  Home Medications   Prior to Admission medications   Medication Sig Start Date End Date Taking? Authorizing Provider  acetaminophen (TYLENOL) 500 MG tablet Take 1,000 mg by mouth every 6 (six) hours as needed for mild pain.   Yes Historical Provider, MD  ampicillin (PRINCIPEN) 250 MG capsule Take 250 mg by mouth 4 (four) times daily.   Yes Historical Provider, MD  Aspirin-Salicylamide-Caffeine (BC HEADACHE POWDER PO) Take 1 packet by mouth 2 (two) times daily as needed. For headache pain   Yes Historical Provider, MD  Propylhexedrine [facility] NA) Place 1  application into the nose 2 (two) times daily.   Yes Historical Provider, MD  triamcinolone (NASACORT ALLERGY 24HR) 55 MCG/ACT AERO nasal inhaler Place 2 sprays into the nose daily.   Yes Historical Provider, MD  LORazepam (ATIVAN) 1 MG tablet Take 1 tablet (1 mg total) by mouth 2 (two) times daily. 12/24/14   Hannah Muthersbaugh, PA-C  methocarbamol  (ROBAXIN) 500 MG tablet Take 1 or 2 po Q 6hrs for pain and muscle soreness 04/09/15   Devoria Albe, MD  naproxen (NAPROSYN) 500 MG tablet Take 1 po BID with food prn pain 04/09/15   Devoria Albe, MD   Triage Vitals: BP 132/118 mmHg  Pulse 69  Temp(Src) 97.7 F (36.5 C) (Oral)  Resp 18  SpO2 97%  LMP 03/31/2015 (Exact Date)  Vital signs normal     Physical Exam  Constitutional: She is oriented to person, place, and time. She appears well-developed and well-nourished.  Non-toxic appearance. She does not appear ill. No distress.  HENT:  Head: Normocephalic and atraumatic.  Right Ear: External ear normal.  Left Ear: External ear normal.  Nose: Nose normal. No mucosal edema or rhinorrhea.  Mouth/Throat: Oropharynx is clear and moist and mucous membranes are normal. No dental abscesses or uvula swelling.  Eyes: Conjunctivae and EOM are normal. Pupils are equal, round, and reactive to light.  Neck: Normal range of motion and full passive range of motion without pain. Neck supple.  Cardiovascular: Normal rate, regular rhythm and normal heart sounds.  Exam reveals no gallop and no friction rub.   No murmur heard. Pulmonary/Chest: Effort normal and breath sounds normal. No respiratory distress. She has no wheezes. She has no rhonchi. She has no rales. She exhibits tenderness. She exhibits no crepitus.    Tenderness to palpation over costal chondral junctions bilaterally   Abdominal: Soft. Normal appearance and bowel sounds are normal. She exhibits no distension. There is no tenderness. There is no rebound and no guarding.  Musculoskeletal: Normal range of motion. She exhibits no edema or tenderness.  Moves all extremities well.   Neurological: She is alert and oriented to person, place, and time. She has normal strength. No cranial nerve deficit.  Skin: Skin is warm, dry and intact. No rash noted. No erythema. No pallor.  Psychiatric: Her speech is normal. Her mood appears not anxious.  Flat affect    Nursing note and vitals reviewed.   ED Course  Procedures (including critical care time)  Medications  0.9 %  sodium chloride infusion (0 mLs Intravenous Stopped 04/09/15 0107)  metoCLOPramide (REGLAN) injection 10 mg (10 mg Intravenous Given 04/09/15 0008)  diphenhydrAMINE (BENADRYL) injection 25 mg (25 mg Intravenous Given 04/09/15 0008)  ketorolac (TORADOL) 30 MG/ML injection 30 mg (30 mg Intravenous Given 04/09/15 0007)     DIAGNOSTIC STUDIES: Oxygen Saturation is 97% on RA, adequate by my interpretation.    COORDINATION OF CARE: 11:17 PM- Discussed treatment plan with pt at bedside and pt agreed to plan.  She was given IV fluids, migraine cocktail, and IV Toradol for her chest pain.  Patient was rechecked prior to discharge. She states her headache is gone. Her chest pain is improved but still present. Patient is constantly taking her fingers and pushing on her chest wall. She was given outpatient referrals for a therapist to help her with the death of her son. She seems to have a lot of denial about her grief. I feel the increased frequency of her migraine headaches in her chest wall pain/costochondritis  is related to this.   Labs Review Results for orders placed or performed during the hospital encounter of 04/08/15  Basic metabolic panel  Result Value Ref Range   Sodium 138 135 - 145 mmol/L   Potassium 3.4 (L) 3.5 - 5.1 mmol/L   Chloride 106 101 - 111 mmol/L   CO2 27 22 - 32 mmol/L   Glucose, Bld 120 (H) 65 - 99 mg/dL   BUN 12 6 - 20 mg/dL   Creatinine, Ser 9.14 0.44 - 1.00 mg/dL   Calcium 8.9 8.9 - 78.2 mg/dL   GFR calc non Af Amer >60 >60 mL/min   GFR calc Af Amer >60 >60 mL/min   Anion gap 5 5 - 15  CBC  Result Value Ref Range   WBC 7.1 4.0 - 10.5 K/uL   RBC 4.62 3.87 - 5.11 MIL/uL   Hemoglobin 12.6 12.0 - 15.0 g/dL   HCT 95.6 (L) 21.3 - 08.6 %   MCV 76.4 (L) 78.0 - 100.0 fL   MCH 27.3 26.0 - 34.0 pg   MCHC 35.7 30.0 - 36.0 g/dL   RDW 57.8 46.9 - 62.9 %    Platelets 364 150 - 400 K/uL  I-stat troponin, ED  Result Value Ref Range   Troponin i, poc 0.01 0.00 - 0.08 ng/mL   Comment 3           Laboratory interpretation all normal except mild hypokalemia   Imaging Review Dg Chest 2 View  04/08/2015   CLINICAL DATA:  Central chest pain  EXAM: CHEST  2 VIEW  COMPARISON:  06/15/2012  FINDINGS: Stable generous heart size.  Negative aortic and hilar contours.  There is no edema, consolidation, effusion, or pneumothorax. No osseous findings to explain chest pain.  IMPRESSION: No active cardiopulmonary disease.   Electronically Signed   By: Marnee Spring M.D.   On: 04/08/2015 22:51   I have personally reviewed and evaluated these images and lab results as part of my medical decision-making.   EKG Interpretation   Date/Time:  Sunday April 08 2015 22:09:33 EDT Ventricular Rate:  70 PR Interval:  139 QRS Duration: 89 QT Interval:  376 QTC Calculation: 406 R Axis:   75 Text Interpretation:  Sinus rhythm Low voltage, precordial leads Baseline  wander  Since last tracing 24 Dec 2014 Possible ST elevation in Inferior  leads Confirmed by KNAPP  MD-I, IVA ([id number]) on 04/08/2015 11:01:39 PM      MDM   Final diagnoses:  Migraine without aura and without status migrainosus, not intractable  Chest wall pain  Grief   Discharge Medication List as of 04/09/2015  1:31 AM    Naproxen 500 mg BID Robaxin [id number] mg QID  Plan discharge  Plan discharge    I personally performed the services described in this documentation, which was scribed in my presence. The recorded information has been reviewed and considered.  Devoria Albe, MD, Concha Pyo, MD 04/09/15 [phone]

## 2015-04-08 NOTE — ED Notes (Signed)
Patient states that she is having chest pain that is radiating to her left side of her chest. Patient is also stating that she has a migraine.

## 2015-04-09 MED ORDER — NAPROXEN 500 MG PO TABS: ORAL_TABLET | ORAL | Status: DC

## 2015-04-09 MED ORDER — METHOCARBAMOL 500 MG PO TABS: ORAL_TABLET | ORAL | Status: DC

## 2015-04-09 NOTE — Discharge Instructions (Signed)
Try ice while will numb the pain and heat which will relax the muscles. Take the medications as prescribed. Recheck if you get cough, short of breath or you feel worse. Consider seeing a therapist to help you with the death of your son.    Costochondritis Costochondritis, sometimes called Tietze syndrome, is a swelling and irritation (inflammation) of the tissue (cartilage) that connects your ribs with your breastbone (sternum). It causes pain in the chest and rib area. Costochondritis usually goes away on its own over time. It can take up to 6 weeks or longer to get better, especially if you are unable to limit your activities. CAUSES  Some cases of costochondritis have no known cause. Possible causes include:  Injury (trauma).  Exercise or activity such as lifting.  Severe coughing. SIGNS AND SYMPTOMS  Pain and tenderness in the chest and rib area.  Pain that gets worse when coughing or taking deep breaths.  Pain that gets worse with specific movements. DIAGNOSIS  Your health care provider will do a physical exam and ask about your symptoms. Chest X-rays or other tests may be done to rule out other problems. TREATMENT  Costochondritis usually goes away on its own over time. Your health care provider may prescribe medicine to help relieve pain. HOME CARE INSTRUCTIONS   Avoid exhausting physical activity. Try not to strain your ribs during normal activity. This would include any activities using chest, abdominal, and side muscles, especially if heavy weights are used.  Apply ice to the affected area for the first 2 days after the pain begins.  Put ice in a plastic bag.  Place a towel between your skin and the bag.  Leave the ice on for 20 minutes, 2-3 times a day.  Only take over-the-counter or prescription medicines as directed by your health care provider. SEEK MEDICAL CARE IF:  You have redness or swelling at the rib joints. These are signs of infection.  Your pain does  not go away despite rest or medicine. SEEK IMMEDIATE MEDICAL CARE IF:   Your pain increases or you are very uncomfortable.  You have shortness of breath or difficulty breathing.  You cough up blood.  You have worse chest pains, sweating, or vomiting.  You have a fever or persistent symptoms for more than 2-3 days.  You have a fever and your symptoms suddenly get worse. MAKE SURE YOU:   Understand these instructions.  Will watch your condition.  Will get help right away if you are not doing well or get worse. Document Released: 04/09/2005 Document Revised: 04/20/2013 Document Reviewed: 02/01/2013 [facility] Patient Information 2015 [employer], [place]. This information is not intended to replace advice given to you by your health care provider. Make sure you discuss any questions you have with your health care provider.  Chest Wall Pain Chest wall pain is pain felt in or around the chest bones and muscles. It may take up to 6 weeks to get better. It may take longer if you are active. Chest wall pain can happen on its own. Other times, things like germs, injury, coughing, or exercise can cause the pain. HOME CARE   Avoid activities that make you tired or cause pain. Try not to use your chest, belly (abdominal), or side muscles. Do not use heavy weights.  Put ice on the sore area.  Put ice in a plastic bag.  Place a towel between your skin and the bag.  Leave the ice on for 15-20 minutes for the first 2  days.  Only take medicine as told by your doctor. GET HELP RIGHT AWAY IF:   You have more pain or are very uncomfortable.  You have a fever.  Your chest pain gets worse.  You have new problems.  You feel sick to your stomach (nauseous) or throw up (vomit).  You start to sweat or feel lightheaded.  You have a cough with mucus (phlegm).  You cough up blood. MAKE SURE YOU:   Understand these instructions.  Will watch your condition.  Will get help right away if you  are not doing well or get worse. Document Released: 12/17/2007 Document Revised: 09/22/2011 Document Reviewed: 02/24/2011 [facility] Patient Information 2015 [place], [place]. This information is not intended to replace advice given to you by your health care provider. Make sure you discuss any questions you have with your health care provider.

## 2015-05-20 ENCOUNTER — Encounter (HOSPITAL_COMMUNITY): Payer: Self-pay | Admitting: Emergency Medicine

## 2015-05-20 ENCOUNTER — Emergency Department (HOSPITAL_COMMUNITY)
Admission: EM | Admit: 2015-05-20 | Discharge: 2015-05-20 | Disposition: A | Discharge status: Home / Self Care | Payer: Medicaid Other | Attending: Physician Assistant | Admitting: Physician Assistant

## 2015-05-20 ENCOUNTER — Emergency Department (HOSPITAL_COMMUNITY): Payer: Medicaid Other

## 2015-05-20 DIAGNOSIS — O99511 Diseases of the respiratory system complicating pregnancy, first trimester: Secondary | ICD-10-CM | POA: Diagnosis not present

## 2015-05-20 DIAGNOSIS — IMO0001 Reserved for inherently not codable concepts without codable children: Secondary | ICD-10-CM

## 2015-05-20 DIAGNOSIS — J069 Acute upper respiratory infection, unspecified: Secondary | ICD-10-CM | POA: Diagnosis not present

## 2015-05-20 DIAGNOSIS — Z7951 Long term (current) use of inhaled steroids: Secondary | ICD-10-CM | POA: Insufficient documentation

## 2015-05-20 DIAGNOSIS — O99611 Diseases of the digestive system complicating pregnancy, first trimester: Secondary | ICD-10-CM | POA: Insufficient documentation

## 2015-05-20 DIAGNOSIS — G8929 Other chronic pain: Secondary | ICD-10-CM | POA: Insufficient documentation

## 2015-05-20 DIAGNOSIS — O99351 Diseases of the nervous system complicating pregnancy, first trimester: Secondary | ICD-10-CM | POA: Diagnosis not present

## 2015-05-20 DIAGNOSIS — O9989 Other specified diseases and conditions complicating pregnancy, childbirth and the puerperium: Secondary | ICD-10-CM | POA: Diagnosis present

## 2015-05-20 DIAGNOSIS — Z3A01 Less than 8 weeks gestation of pregnancy: Secondary | ICD-10-CM | POA: Diagnosis not present

## 2015-05-20 DIAGNOSIS — K219 Gastro-esophageal reflux disease without esophagitis: Secondary | ICD-10-CM | POA: Diagnosis not present

## 2015-05-20 DIAGNOSIS — Z79899 Other long term (current) drug therapy: Secondary | ICD-10-CM | POA: Insufficient documentation

## 2015-05-20 DIAGNOSIS — R109 Unspecified abdominal pain: Secondary | ICD-10-CM

## 2015-05-20 DIAGNOSIS — Z3201 Encounter for pregnancy test, result positive: Secondary | ICD-10-CM

## 2015-05-20 LAB — URINALYSIS, ROUTINE W REFLEX MICROSCOPIC
Bilirubin Urine: NEGATIVE
GLUCOSE, UA: 100 mg/dL — AB
HGB URINE DIPSTICK: NEGATIVE
KETONES UR: NEGATIVE mg/dL
LEUKOCYTES UA: NEGATIVE
Nitrite: NEGATIVE
PROTEIN: NEGATIVE mg/dL
Specific Gravity, Urine: 1.024 (ref 1.005–1.030)
UROBILINOGEN UA: 1 mg/dL (ref 0.0–1.0)
pH: 6.5 (ref 5.0–8.0)

## 2015-05-20 LAB — CBC WITH DIFFERENTIAL/PLATELET
BASOS ABS: 0 10*3/uL (ref 0.0–0.1)
BASOS PCT: 0 %
EOS ABS: 0.4 10*3/uL (ref 0.0–0.7)
EOS PCT: 4 %
HCT: 36 % (ref 36.0–46.0)
HEMOGLOBIN: 13.2 g/dL (ref 12.0–15.0)
Lymphocytes Relative: 35 %
Lymphs Abs: 3.2 10*3/uL (ref 0.7–4.0)
MCH: 27.7 pg (ref 26.0–34.0)
MCHC: 36.7 g/dL — ABNORMAL HIGH (ref 30.0–36.0)
MCV: 75.6 fL — ABNORMAL LOW (ref 78.0–100.0)
Monocytes Absolute: 0.6 10*3/uL (ref 0.1–1.0)
Monocytes Relative: 6 %
NEUTROS PCT: 55 %
Neutro Abs: 5.1 10*3/uL (ref 1.7–7.7)
PLATELETS: 342 10*3/uL (ref 150–400)
RBC: 4.76 MIL/uL (ref 3.87–5.11)
RDW: 13.7 % (ref 11.5–15.5)
WBC: 9.4 10*3/uL (ref 4.0–10.5)

## 2015-05-20 LAB — BASIC METABOLIC PANEL
Anion gap: 6 (ref 5–15)
BUN: 6 mg/dL (ref 6–20)
CHLORIDE: 105 mmol/L (ref 101–111)
CO2: 26 mmol/L (ref 22–32)
Calcium: 9.3 mg/dL (ref 8.9–10.3)
Creatinine, Ser: 0.51 mg/dL (ref 0.44–1.00)
Glucose, Bld: 94 mg/dL (ref 65–99)
POTASSIUM: 3.9 mmol/L (ref 3.5–5.1)
SODIUM: 137 mmol/L (ref 135–145)

## 2015-05-20 LAB — POC URINE PREG, ED: PREG TEST UR: POSITIVE — AB

## 2015-05-20 LAB — RAPID STREP SCREEN (MED CTR MEBANE ONLY): Streptococcus, Group A Screen (Direct): NEGATIVE

## 2015-05-20 MED ORDER — SUCRALFATE 1 G PO TABS: 1.0000 g | ORAL_TABLET | Freq: Three times a day (TID) | ORAL | Status: DC

## 2015-05-20 MED ORDER — SUCRALFATE 1 G PO TABS
1.0000 g | ORAL_TABLET | Freq: Once | ORAL | Status: AC
  Administered 2015-05-20: 1 g via ORAL
  Filled 2015-05-20: qty 1

## 2015-05-20 MED ORDER — PRENATAL COMPLETE 14-0.4 MG PO TABS: 1.0000 | ORAL_TABLET | Freq: Every day | ORAL | Status: DC

## 2015-05-20 NOTE — ED Notes (Addendum)
Pt reported nasal congestion, cold-like symptoms, indigestion without relief from Pepcid and Zantac. Pt reported nonproductive cough. Denies fever. Pt approx [redacted] weeks pregnant.

## 2015-05-20 NOTE — ED Provider Notes (Signed)
CSN: [id number]     Arrival date & time 05/20/15  1243 History  By signing my name below, I, Elon SpannerGarrett Cook, attest that this documentation has been prepared under the direction and in the presence of Glean HessElizabeth Westfall, [place]-C. Electronically Signed: Elon SpannerGarrett Cook ED Scribe. 05/20/2015. 3:32 PM.    Chief Complaint  Patient presents with  . Nasal Congestion    The history is provided by the patient. No language interpreter was used.    HPI Comments: Anna MangoMaimouna Walls is a 34 y.o. female who presents to the Emergency Department complaining of yellow nasal congestion onset 3 weeks ago. Associated symptoms include sore throat, dry cough, and heartburn unrelieved by zantac and pepcid. She also notes chills, dizziness, decreased appetite, lower abdominal pain, urinary frequency, diarrhea, nausea, and vomiting (once yesterday and once this morning). She reports a positive home pregnancy test two weeks ago with LNMP 02/2015. She is not being followed for her pregnancy. She denies headache, lightheadedness, constipation, dysuria, fever.    Past Medical History  Diagnosis Date  . Sickle cell trait (HCC)   . Low back pain   . Chronic headaches    History reviewed. No pertinent past surgical history. Family History  Problem Relation Age of Onset  . Family history unknown: Yes   Social History  Substance Use Topics  . Smoking status: Never Smoker   . Smokeless tobacco: None  . Alcohol Use: No   OB History    Gravida Para Term Preterm AB TAB SAB Ectopic Multiple Living   1               Review of Systems  Constitutional: Positive for chills. Negative for fever.  HENT: Positive for congestion and sore throat. Negative for trouble swallowing.   Cardiovascular: Negative for chest pain.  Gastrointestinal: Positive for nausea, vomiting, abdominal pain and diarrhea. Negative for constipation.  Genitourinary: Positive for frequency. Negative for dysuria.  Neurological: Positive for dizziness.  Negative for light-headedness and headaches.      Allergies  Review of patient's allergies indicates no known allergies.  Home Medications   Prior to Admission medications   Medication Sig Start Date End Date Taking? Authorizing Provider  acetaminophen (TYLENOL) 500 MG tablet Take 1,000 mg by mouth every 6 (six) hours as needed for mild pain.   Yes Historical Provider, MD  Chlorphen-Phenyleph-Ibuprofen (ADVIL ALLERGY & CONGESTION) 4-10-200 MG TABS Take 1 tablet by mouth every 6 (six) hours as needed (for cold and allergies).   Yes Historical Provider, MD  famotidine (PEPCID) 20 MG tablet Take 20 mg by mouth 2 (two) times daily.   Yes Historical Provider, MD  Propylhexedrine [facility](BENZEDREX NA) Place 1 application into the nose 2 (two) times daily.   Yes Historical Provider, MD  triamcinolone (NASACORT ALLERGY 24HR) 55 MCG/ACT AERO nasal inhaler Place 2 sprays into the nose daily.   Yes Historical Provider, MD  LORazepam (ATIVAN) 1 MG tablet Take 1 tablet (1 mg total) by mouth 2 (two) times daily. Patient not taking: Reported on 05/20/2015 12/24/14   Dahlia ClientHannah Muthersbaugh, PA-C  methocarbamol (ROBAXIN) 500 MG tablet Take 1 or 2 po Q 6hrs for pain and muscle soreness Patient not taking: Reported on 05/20/2015 04/09/15   Devoria AlbeIva Knapp, MD  naproxen (NAPROSYN) 500 MG tablet Take 1 po BID with food prn pain Patient not taking: Reported on 05/20/2015 04/09/15   Devoria AlbeIva Knapp, MD  Prenatal Vit-Fe Fumarate-FA (PRENATAL COMPLETE) 14-0.4 MG TABS Take 1 tablet by mouth daily. 05/20/15   Lanora ManisElizabeth  C Westfall, PA-C  sucralfate (CARAFATE) 1 G tablet Take 1 tablet (1 g total) by mouth 4 (four) times daily -  with meals and at bedtime. 05/20/15   Mady Gemma, PA-C    BP 117/67 mmHg  Pulse 84  Temp(Src) 98.4 F (36.9 C) (Oral)  Resp 18  SpO2 100%  LMP 02/21/2015 Physical Exam  Constitutional: She is oriented to person, place, and time. She appears well-developed and well-nourished. No distress.  HENT:  Head:  Normocephalic and atraumatic.  Right Ear: External ear normal.  Left Ear: External ear normal.  Nose: Right sinus exhibits no maxillary sinus tenderness and no frontal sinus tenderness. Left sinus exhibits no maxillary sinus tenderness and no frontal sinus tenderness.  Mouth/Throat: Uvula is midline, oropharynx is clear and moist and mucous membranes are normal.  Eyes: Conjunctivae, EOM and lids are normal. Pupils are equal, round, and reactive to light. Right eye exhibits no discharge. Left eye exhibits no discharge. No scleral icterus.  Neck: Normal range of motion. Neck supple. No tracheal deviation present.  Cardiovascular: Normal rate, regular rhythm, normal heart sounds, intact distal pulses and normal pulses.   Pulmonary/Chest: Effort normal and breath sounds normal. No respiratory distress. She has no wheezes. She has no rales.  Abdominal: Soft. Normal appearance and bowel sounds are normal. She exhibits no distension and no mass. There is tenderness. There is no rigidity, no rebound and no guarding.  Mild TTP of suprapubic region. No rebound, guarding, or masses.  Musculoskeletal: Normal range of motion. She exhibits no edema or tenderness.  Neurological: She is alert and oriented to person, place, and time. She has normal strength. No sensory deficit.  Skin: Skin is warm, dry and intact. No rash noted. She is not diaphoretic. No erythema. No pallor.  Psychiatric: She has a normal mood and affect. Her speech is normal and behavior is normal.  Nursing note and vitals reviewed.   ED Course  Procedures (including critical care time)  DIAGNOSTIC STUDIES: Oxygen Saturation is 100% on RA, normal by my interpretation.    COORDINATION OF CARE: 3:31 PM Will order labs. Patient acknowledges and agrees with plan.    Labs Review Labs Reviewed  CBC WITH DIFFERENTIAL/PLATELET - Abnormal; Notable for the following:    MCV 75.6 (*)    MCHC 36.7 (*)    All other components within normal  limits  URINALYSIS, ROUTINE W REFLEX MICROSCOPIC (NOT AT [facility]) - Abnormal; Notable for the following:    Glucose, UA 100 (*)    All other components within normal limits  POC URINE PREG, ED - Abnormal; Notable for the following:    Preg Test, Ur POSITIVE (*)    All other components within normal limits  RAPID STREP SCREEN (NOT AT [facility])  CULTURE, GROUP A STREP  BASIC METABOLIC PANEL    Imaging Review US Ob Comp Less 14 Wks  05/20/2015  CLINICAL DATA:  Pregnant patient with abdominal pelvic pain. EXAM: OBSTETRIC <14 WK [place] AND TRANSVAGINAL OB US TECHNIQUE: Both transabdominal and transvaginal ultrasound examinations were performed for complete evaluation of the gestation as well as the maternal uterus, adnexal regions, and pelvic cul-de-sac. Transvaginal technique was performed to assess early pregnancy. COMPARISON:  None. FINDINGS: Intrauterine gestational sac: Visualized/normal in shape. Yolk sac:  Present. Embryo:  Present. Cardiac Activity: Present. Heart Rate: 140  bpm CRL:  12.8  mm   7 w   4 d  [place] EDC: 01/02/2016 Maternal uterus/adnexae: No subchorionic hemorrhage. The right ovary is normal measuring 2.8 x 1.4 x 2.0 cm. The left ovary measures 3.2 x 2.1 x 2.3 cm and contains a 1.6 cm corpus luteal cyst. There is no pelvic free fluid. IMPRESSION: Single live intrauterine pregnancy estimated gestational age [redacted] weeks 4 days for estimated date of delivery 01/02/2016. No complication. Electronically Signed   By: Rubye Oaks M.D.   On: 05/20/2015 18:21   US Ob Comp Less 14 Wks  05/20/2015  CLINICAL DATA:  Pregnant patient with abdominal pelvic pain. EXAM: OBSTETRIC <14 WK [place] AND TRANSVAGINAL OB US TECHNIQUE: Both transabdominal and transvaginal ultrasound examinations were performed for complete evaluation of the gestation as well as the maternal uterus, adnexal regions, and pelvic cul-de-sac. Transvaginal technique was performed to assess early pregnancy. COMPARISON:  None.  FINDINGS: Intrauterine gestational sac: Visualized/normal in shape. Yolk sac:  Present. Embryo:  Present. Cardiac Activity: Present. Heart Rate: 140  bpm CRL:  12.8  mm   7 w   4 d                  [place] EDC: 01/02/2016 Maternal uterus/adnexae: No subchorionic hemorrhage. The right ovary is normal measuring 2.8 x 1.4 x 2.0 cm. The left ovary measures 3.2 x 2.1 x 2.3 cm and contains a 1.6 cm corpus luteal cyst. There is no pelvic free fluid. IMPRESSION: Single live intrauterine pregnancy estimated gestational age [redacted] weeks 4 days for estimated date of delivery 01/02/2016. No complication. Electronically Signed   By: Rubye Oaks M.D.   On: 05/20/2015 18:21     I have personally reviewed and evaluated these images and lab results as part of my medical decision-making.   EKG Interpretation None      MDM   Final diagnoses:  Positive pregnancy test  Abdominal pain  URI (upper respiratory infection)  Reflux    34 year old female presents with nasal congestion, sore throat, and dry cough x 3 weeks. Also reports she had a positive home pregnancy test 2 weeks ago. States she has experienced chills, dizziness, decreased appetite, heartburn, lower abdominal pain, urinary frequency, diarrhea, nausea, and vomiting (once yesterday and once this morning). Denies headache, lightheadedness, constipation, dysuria, fever.   Patient is afebrile. Vital signs stable. Posterior oropharynx without edema, erythema, or exudate. Small amount of dried nasal congestion. Heart regular rate and rhythm. Lungs clear to auscultation bilaterally. Abdomen soft, nondistended, with mild tenderness to palpation in suprapubic region. No rebound, guarding, or masses. No lower extremity edema.  CBC negative for leukocytosis or anemia. BMP within normal limits. Rapid strep negative.  Urine pregnancy positive. UA negative for infection.  Will obtain US given abdominal pain and positive pregnancy test.  Patient requesting medication  for indigestion, will give sucralfate. Reports improvement in symptoms s/p medication administration. [place] remarkable for single live IUP estimated gestational age [redacted] weeks and 4 days, no complication.  Feel patient is stable for discharge at this time. Advised to try warm honey, tea, cough drops for symptoms. Will give carafate for reflux.  Return precautions discussed at length. [facility]. Advised to start taking daily prenatal vitamin.  BP 117/67 mmHg  Pulse 84  Temp(Src) 98.4 F (36.9 C) (Oral)  Resp 18  SpO2 100%  LMP 02/21/2015  I personally performed the services described in this documentation, which was scribed in my presence. The recorded information has been reviewed and is accurate.  Mady Gemma, PA-C 05/20/15 2211  Courteney Randall An, MD 05/20/15 2311

## 2015-05-20 NOTE — Discharge Instructions (Signed)
1. Medications: sucralfate, prenatal vitamin, usual home medications 2. Treatment: rest, drink plenty of fluids; you can try warm honey, tea, cough drops for your symptoms 3. Follow Up: [facility] and for discussion of your diagnoses and further evaluation after today's visit; if you do not have a primary care doctor use the resource guide provided to find one; please return to the ER for severe abdominal pain, vaginal bleeding, new or worsening symptoms   Heartburn During Pregnancy Heartburn is a burning sensation in the chest caused by stomach acid backing up into the esophagus. Heartburn is common in pregnancy because a certain hormone (progesterone) is released when a woman is pregnant. The progesterone hormone may relax the valve that separates the esophagus from the stomach. This allows acid to go up into the esophagus, causing heartburn. Heartburn may also happen in pregnancy because the enlarging uterus pushes up on the stomach, which pushes more acid into the esophagus. This is especially true in the later stages of pregnancy. Heartburn problems usually go away after giving birth. CAUSES  Heartburn is caused by stomach acid backing up into the esophagus. During pregnancy, this may result from various things, including:   The progesterone hormone.  Changing hormone levels.  The growing uterus pushing stomach acid upward.  Large meals.  Certain foods and drinks.  Exercise.  Increased acid production. SIGNS AND SYMPTOMS   Burning pain in the chest or lower throat.  Bitter taste in the mouth.  Coughing. DIAGNOSIS  Your health care provider will typically diagnose heartburn by taking a careful history of your concern. Blood tests may be done to check for a certain type of bacteria that is associated with heartburn. Sometimes, heartburn is diagnosed by prescribing a heartburn medicine to see if the symptoms improve. In some  cases, a procedure called an endoscopy may be done. In this procedure, a tube with a light and a camera on the end (endoscope) is used to examine the esophagus and the stomach. TREATMENT  Treatment will vary depending on the severity of your symptoms. Your health care provider may recommend:  Over-the-counter medicines (antacids, acid reducers) for mild heartburn.  Prescription medicines to decrease stomach acid or to protect your stomach lining.  Certain changes in your diet.  Elevating the head of your bed by putting blocks under the legs. This helps prevent stomach acid from backing up into the esophagus when you are lying down. HOME CARE INSTRUCTIONS   Only take over-the-counter or prescription medicines as directed by your health care provider.  Raise the head of your bed by putting blocks under the legs if instructed to do so by your health care provider. Sleeping with more pillows is not effective because it only changes the position of your head.  Do not exercise right after eating.  Avoid eating 2-3 hours before bed. Do not lie down right after eating.  Eat small meals throughout the day instead of three large meals.  Identify foods and beverages that make your symptoms worse and avoid them. Foods you may want to avoid include:  Peppers.  Chocolate.  High-fat foods, including fried foods.  Spicy foods.  Garlic and onions.  Citrus fruits, including oranges, grapefruit, lemons, and limes.  Food containing tomatoes or tomato products.  Mint.  Carbonated and caffeinated drinks.  Vinegar. SEEK MEDICAL CARE IF:  You have abdominal pain of any kind.  You feel burning in your upper abdomen or chest, especially after eating or  lying down.  You have nausea and vomiting.  Your stomach feels upset after you eat. SEEK IMMEDIATE MEDICAL CARE IF:   You have severe chest pain that goes down your arm or into your jaw or neck.  You feel sweaty, dizzy, or  light-headed.  You become short of breath.  You vomit blood.  You have difficulty or pain with swallowing.  You have bloody or black, tarry stools.  You have episodes of heartburn more than 3 times a week, for more than 2 weeks. MAKE SURE YOU:  Understand these instructions.  Will watch your condition.  Will get help right away if you are not doing well or get worse.   This information is not intended to replace advice given to you by your health care provider. Make sure you discuss any questions you have with your health care provider.   Document Released: 06/27/2000 Document Revised: 07/21/2014 Document Reviewed: 02/16/2013 Elsevier Interactive Patient Education 2016 [employer].  Prenatal Care WHAT IS PRENATAL CARE?  Prenatal care is the process of caring for a pregnant woman before she gives birth. Prenatal care makes sure that she and her baby remain as healthy as possible throughout pregnancy. Prenatal care may be provided by a midwife, family practice health care provider, or a childbirth and pregnancy specialist (obstetrician). Prenatal care may include physical examinations, testing, treatments, and education on nutrition, lifestyle, and social support services. WHY IS PRENATAL CARE SO IMPORTANT?  Early and consistent prenatal care increases the chance that you and your baby will remain healthy throughout your pregnancy. This type of care also decreases a baby's risk of being born too early (prematurely), or being born smaller than expected (small for gestational age). Any underlying medical conditions you may have that could pose a risk during your pregnancy are discussed during prenatal care visits. You will also be monitored regularly for any new conditions that may arise during your pregnancy so they can be treated quickly and effectively. WHAT HAPPENS DURING PRENATAL CARE VISITS? Prenatal care visits may include the following: Discussion Tell your health care provider  about any new signs or symptoms you have experienced since your last visit. These might include:  Nausea or vomiting.  Increased or decreased level of energy.  Difficulty sleeping.  Back or leg pain.  Weight changes.  Frequent urination.  Shortness of breath with physical activity.  Changes in your skin, such as the development of a rash or itchiness.  Vaginal discharge or bleeding.  Feelings of excitement or nervousness.  Changes in your baby's movements. You may want to write down any questions or topics you want to discuss with your health care provider and bring them with you to your appointment. Examination During your first prenatal care visit, you will likely have a complete physical exam. Your health care provider will often examine your vagina, cervix, and the position of your uterus, as well as check your heart, lungs, and other body systems. As your pregnancy progresses, your health care provider will measure the size of your uterus and your baby's position inside your uterus. He or she may also examine you for early signs of labor. Your prenatal visits may also include checking your blood pressure and, after about 10-12 weeks of pregnancy, listening to your baby's heartbeat. Testing Regular testing often includes:  Urinalysis. This checks your urine for glucose, protein, or signs of infection.  Blood count. This checks the levels of white and red blood cells in your body.  Tests for sexually  transmitted infections (STIs). Testing for STIs at the beginning of pregnancy is routinely done and is required in many states.  Antibody testing. You will be checked to see if you are immune to certain illnesses, such as rubella, that can affect a developing fetus.  Glucose screen. Around 24-28 weeks of pregnancy, your blood glucose level will be checked for signs of gestational diabetes. Follow-up tests may be recommended.  Group B strep. This is a bacteria that is commonly  found inside a woman's vagina. This test will inform your health care provider if you need an antibiotic to reduce the amount of this bacteria in your body prior to labor and childbirth.  Ultrasound. Many pregnant women undergo an ultrasound screening around 18-20 weeks of pregnancy to evaluate the health of the fetus and check for any developmental abnormalities.  HIV (human immunodeficiency virus) testing. Early in your pregnancy, you will be screened for HIV. If you are at high risk for HIV, this test may be repeated during your third trimester of pregnancy. You may be offered other testing based on your age, personal or family medical history, or other factors.  HOW OFTEN SHOULD I PLAN TO SEE MY HEALTH CARE PROVIDER FOR PRENATAL CARE? Your prenatal care check-up schedule depends on any medical conditions you have before, or develop during, your pregnancy. If you do not have any underlying medical conditions, you will likely be seen for checkups:  Monthly, during the first 6 months of pregnancy.  Twice a month during months 7 and 8 of pregnancy.  Weekly starting in the 9th month of pregnancy and until delivery. If you develop signs of early labor or other concerning signs or symptoms, you may need to see your health care provider more often. Ask your health care provider what prenatal care schedule is best for you. WHAT CAN I DO TO KEEP MYSELF AND MY BABY AS HEALTHY AS POSSIBLE DURING MY PREGNANCY?  Take a prenatal vitamin containing 400 micrograms (0.4 mg) of folic acid every day. Your health care provider may also ask you to take additional vitamins such as iodine, vitamin D, iron, copper, and zinc.  Take 1500-2000 mg of calcium daily starting at your 20th week of pregnancy until you deliver your baby.  Make sure you are up to date on your vaccinations. Unless directed otherwise by your health care provider:  You should receive a tetanus, diphtheria, and pertussis (Tdap) vaccination  between the 27th and 36th week of your pregnancy, regardless of when your last Tdap immunization occurred. This helps protect your baby from whooping cough (pertussis) after he or she is born.  You should receive an annual inactivated influenza vaccine (IIV) to help protect you and your baby from influenza. This can be done at any point during your pregnancy.  Eat a well-rounded diet that includes:  Fresh fruits and vegetables.  Lean proteins.  Calcium-rich foods such as milk, yogurt, hard cheeses, and dark, leafy greens.  Whole grain breads.  Do noteat seafood high in mercury, including:  Swordfish.  Tilefish.  Shark.  King mackerel.  More than 6 oz tuna per week.  Do not eat:  Raw or undercooked meats or eggs.  Unpasteurized foods, such as soft cheeses (brie, blue, or feta), juices, and milks.  Lunch meats.  Hot dogs that have not been heated until they are steaming.  Drink enough water to keep your urine clear or pale yellow. For many women, this may be 10 or more 8 oz glasses of water  each day. Keeping yourself hydrated helps deliver nutrients to your baby and may prevent the start of pre-term uterine contractions.  Do not use any tobacco products including cigarettes, chewing tobacco, or electronic cigarettes. If you need help quitting, ask your health care provider.  Do not drink beverages containing alcohol. No safe level of alcohol consumption during pregnancy has been determined.  Do not use any illegal drugs. These can harm your developing baby or cause a miscarriage.  Ask your health care provider or pharmacist before taking any prescription or over-the-counter medicines, herbs, or supplements.  Limit your caffeine intake to no more than 200 mg per day.  Exercise. Unless told otherwise by your health care provider, try to get 30 minutes of moderate exercise most days of the week. Do not  do high-impact activities, contact sports, or activities with a high  risk of falling, such as horseback riding or downhill skiing.  Get plenty of rest.  Avoid anything that raises your body temperature, such as hot tubs and saunas.  If you own a cat, do not empty its litter box. Bacteria contained in cat feces can cause an infection called toxoplasmosis. This can result in serious harm to the fetus.  Stay away from chemicals such as insecticides, lead, mercury, and cleaning or paint products that contain solvents.  Do not have any X-rays taken unless medically necessary.  Take a childbirth and breastfeeding preparation class. Ask your health care provider if you need a referral or recommendation.   This information is not intended to replace advice given to you by your health care provider. Make sure you discuss any questions you have with your health care provider.   Document Released: 07/03/2003 Document Revised: 07/21/2014 Document Reviewed: 09/14/2013 Elsevier Interactive Patient Education 2016 Elsevier Inc.  Upper Respiratory Infection, Adult Most upper respiratory infections (URIs) are a viral infection of the air passages leading to the lungs. A URI affects the nose, throat, and upper air passages. The most common type of URI is nasopharyngitis and is typically referred to as "the common cold." URIs run their course and usually go away on their own. Most of the time, a URI does not require medical attention, but sometimes a bacterial infection in the upper airways can follow a viral infection. This is called a secondary infection. Sinus and middle ear infections are common types of secondary upper respiratory infections. Bacterial pneumonia can also complicate a URI. A URI can worsen asthma and chronic obstructive pulmonary disease (COPD). Sometimes, these complications can require emergency medical care and may be life threatening.  CAUSES Almost all URIs are caused by viruses. A virus is a type of germ and can spread from one person to another.   RISKS FACTORS You may be at risk for a URI if:   You smoke.   You have chronic heart or lung disease.  You have a weakened defense (immune) system.   You are very young or very old.   You have nasal allergies or asthma.  You work in crowded or poorly ventilated areas.  You work in health care facilities or schools. SIGNS AND SYMPTOMS  Symptoms typically develop 2-3 days after you come in contact with a cold virus. Most viral URIs last 7-10 days. However, viral URIs from the influenza virus (flu virus) can last 14-18 days and are typically more severe. Symptoms may include:   Runny or stuffy (congested) nose.   Sneezing.   Cough.   Sore throat.   Headache.  Fatigue.   Fever.   Loss of appetite.   Pain in your forehead, behind your eyes, and over your cheekbones (sinus pain).  Muscle aches.  DIAGNOSIS  Your health care provider may diagnose a URI by:  Physical exam.  Tests to check that your symptoms are not due to another condition such as:  Strep throat.  Sinusitis.  Pneumonia.  Asthma. TREATMENT  A URI goes away on its own with time. It cannot be cured with medicines, but medicines may be prescribed or recommended to relieve symptoms. Medicines may help:  Reduce your fever.  Reduce your cough.  Relieve nasal congestion. HOME CARE INSTRUCTIONS   Take medicines only as directed by your health care provider.   Gargle warm saltwater or take cough drops to comfort your throat as directed by your health care provider.  Use a warm mist humidifier or inhale steam from a shower to increase air moisture. This may make it easier to breathe.  Drink enough fluid to keep your urine clear or pale yellow.   Eat soups and other clear broths and maintain good nutrition.   Rest as needed.   Return to work when your temperature has returned to normal or as your health care provider advises. You may need to stay home longer to avoid infecting  others. You can also use a face mask and careful hand washing to prevent spread of the virus.  Increase the usage of your inhaler if you have asthma.   Do not use any tobacco products, including cigarettes, chewing tobacco, or electronic cigarettes. If you need help quitting, ask your health care provider. PREVENTION  The best way to protect yourself from getting a cold is to practice good hygiene.   Avoid oral or hand contact with people with cold symptoms.   Wash your hands often if contact occurs.  There is no clear evidence that vitamin C, vitamin E, echinacea, or exercise reduces the chance of developing a cold. However, it is always recommended to get plenty of rest, exercise, and practice good nutrition.  SEEK MEDICAL CARE IF:   You are getting worse rather than better.   Your symptoms are not controlled by medicine.   You have chills.  You have worsening shortness of breath.  You have brown or red mucus.  You have yellow or brown nasal discharge.  You have pain in your face, especially when you bend forward.  You have a fever.  You have swollen neck glands.  You have pain while swallowing.  You have white areas in the back of your throat. SEEK IMMEDIATE MEDICAL CARE IF:   You have severe or persistent:  Headache.  Ear pain.  Sinus pain.  Chest pain.  You have chronic lung disease and any of the following:  Wheezing.  Prolonged cough.  Coughing up blood.  A change in your usual mucus.  You have a stiff neck.  You have changes in your:  Vision.  Hearing.  Thinking.  Mood. MAKE SURE YOU:   Understand these instructions.  Will watch your condition.  Will get help right away if you are not doing well or get worse.   This information is not intended to replace advice given to you by your health care provider. Make sure you discuss any questions you have with your health care provider.   Document Released: 12/24/2000 Document  Revised: 11/14/2014 Document Reviewed: 10/05/2013 Elsevier Interactive Patient Education 2016 [employer].   Emergency Department Resource Guide 1) Find a  Doctor and Pay Out of Pocket Although you won't have to find out who is covered by your insurance plan, it is a good idea to ask around and get recommendations. You will then need to call the office and see if the doctor you have chosen will accept you as a new patient and what types of options they offer for patients who are self-pay. Some doctors offer discounts or will set up payment plans for their patients who do not have insurance, but you will need to ask so you aren't surprised when you get to your appointment.  2) Contact Your Local Health Department Not all health departments have doctors that can see patients for sick visits, but many do, so it is worth a call to see if yours does. If you don't know where your local health department is, you can check in your phone book. The CDC also has a tool to help you locate your state's health department, and many state websites also have listings of all of their local health departments.  3) Find a Walk-in Clinic If your illness is not likely to be very severe or complicated, you may want to try a walk in clinic. These are popping up all over the country in pharmacies, drugstores, and shopping centers. They're usually staffed by nurse practitioners or physician assistants that have been trained to treat common illnesses and complaints. They're usually fairly quick and inexpensive. However, if you have serious medical issues or chronic medical problems, these are probably not your best option.  No Primary Care Doctor: - Call [facility] Connect at  [phone] - they can help you locate a primary care doctor that  accepts your insurance, provides certain services, etc. - Physician Referral Service- [phone]  Chronic Pain Problems: Organization         Address  Phone   Notes  [facility]  [phone] Patients need to be referred by their primary care doctor.   Medication Assistance: Organization         Address  Phone   Notes  [facility] Medication [facility] [address] [place], [place] [address] [phone] --Must be a resident of [facility] -- Must have NO insurance coverage whatsoever (no Medicaid/ Medicare, etc.) -- The pt. MUST have a primary care doctor that directs their care regularly and follows them in the community   [facility]  [phone]   [employer]  [phone]    Agencies that provide inexpensive medical care: Organization         Address  Phone   Notes  [facility]  [phone]   [facility]    [phone]   [facility] [address] [place], [place] [address] [phone]   [facility] [address] [place] [phone]   [facility]anned Parenthood    [phone]   [facility]    [phone]   [facility]  [address], [place] Phone:  [phone], Fax:  [phone] Hours of Operation:  9 am - 6 pm, M-F.  Also accepts Medicaid/Medicare and self-pay.  [facility]  [address], [place] Phone: [phone], Fax: [phone]. Hours of Operation:  8:30 am - 5:30 pm, M-F.  Also accepts Medicaid and self-pay.  [facility] [place] [address] [employer] Phone: [phone]  [facility] [address], [place], [place] [phone], Ext. [phone] Mondays & Thursdays: 7-9 AM.  First 15 patients are seen on a first come, first serve basis.    Medicaid-accepting [place] Providers:  Organization         Address  Phone   Notes  [facility] [address], [place] [phone]) [phone] Also accepts self-pay patients.  [facility] [address], [place]  [phone]) [phone]   [facility] [address], [place] [phone]   [facility] [address] [place] [address], [place] [phone]) [phone]   Renaye RakersVeita Bland [address], [place]   [phone] Only accepts [place] patients after they have their name applied to their card.   Self-Pay (no insurance) in [facility]:  Organization         Address  Phone   Notes  Sickle Cell Patients, [facility] [address], [place] [phone]) [phone]   [facility] [address], [place] [phone]) [phone]   [facility]  [address], [place] [phone]) [phone]   [facility]/Dr. Osei-Bonsu  [address] [place] [address], [place] or [phone], [place] [phone] Phone number for both [place] and [place] locations is the same.  [facility] [address], [place] [phone]) [phone]   [facility] [address] [place] [address], [place] or [address] [phone](336) [phone]) [phone]   [facility] [address], [place] [phone]) [phone], phone; [phone](336) [phone], fax Sees patients 1st and 3rd Saturday of every month.  Must not qualify for public or private insurance (i.e. Medicaid, Medicare, [place] Health Choice, Veterans' Benefits)  Household income should be no more than 200% of the poverty level The clinic cannot treat you if you are pregnant or think you are pregnant  Sexually transmitted diseases are not treated at the clinic.    Dental Care: Organization         Address  Phone  Notes  [facility] Department of [facility] [address], [place] [phone] Accepts children up to age 32 who are enrolled in [place] or [employer] Health Choice; pregnant women with a Medicaid card; and children who have applied for Medicaid or [place]  Health Choice, but were declined, whose parents can pay a reduced fee at time of service.  [facility] [place]  [address], [place] [phone] Accepts children up to age 32 who are enrolled in [place] or [place] Health Choice; pregnant women with a Medicaid card; and children who have applied for Medicaid or [place] Health Choice, but were declined, whose parents can pay a reduced fee at time of service.  [facility]  [address], [place] [phone] Patients are seen by appointment only. Walk-ins are not accepted. [facility] will see patients 10018 years of age and older. Monday - Tuesday (8am-5pm) Most Wednesdays (8:30-5pm) $30 per visit, cash only  [facility]  [address], [facility] [place] [phone] Patients are seen by appointment only. Walk-ins are not accepted. [facility] will see patients 32 years of age and older. One Wednesday Evening (Monthly: Volunteer Based).  $30 per visit,  cash only  [employer] [facility]  [phone] for adults; Children under age 30, call Graduate Pediatric Dentistry at [phone]. Children aged 35-14, please call [phone] to request a pediatric application.  Dental services are provided in all areas of dental care including fillings, crowns and bridges, complete and partial dentures, implants, gum treatment, root canals, and extractions. Preventive care is also provided. Treatment is provided to both adults and children. Patients are selected via a lottery and there is often a waiting list.   [facility] [address] [place]  [phone] [email or web address]   [facility] [address] [place], [place] [phone], Ext. [phone] Second and Fourth Thursday of each month, opens at 6:30 AM; Clinic ends at 9 AM.  Patients are seen on a first-come first-served basis, and a limited number are seen during  each clinic.   [facility]  [address] [place], [place] [phone]   Eligibility Requirements You must have lived in [place], [place], or [place] counties for at least the last three months.   You cannot be eligible for state or federal sponsored [place], including [employer], [place], or [employer].   You generally cannot be eligible for healthcare insurance through your employer.    How to apply: Eligibility screenings are held every Tuesday and Wednesday afternoon from 1:00 pm until 4:00 pm. You do not need an appointment for the interview!  [facility] [address], [employer], [place] [phone]   [facility]  [phone]   [facility]  [phone]   [facility] Department  [phone]    Behavioral Health Resources in the Community: Intensive Outpatient Programs Organization         Address  Phone  Notes  [facility] [address] [place], [place] [phone]   [facility] [address] [place], [place] [phone]   [facility] [address], [place], [place]  [phone]   [facility] [address],  [place], [place] [phone] or [phone]   Substance Abuse Resources Organization         Address  Phone  Notes  [facility] Services  [phone]   [facility]  [phone]   [facility]  [phone]   Floydene Flock  [phone]   Residential & Outpatient Substance Abuse Program  [phone]   Psychological Services Organization         Address  Phone  Notes  [facility]  [phone]   [facility] Services  [phone]   [facility] Mental Health [address] [place] [phone] or [phone]    Mobile Crisis Teams Organization         Address  Phone  Notes  [facility], Mobile  Crisis Care Unit  [phone]   [facility]  [address] [place], [place] [phone]   Doristine Locks [address] [place] [phone]    Self-Help/Support Groups Organization         Address  Phone             Notes  [facility] [place] - variety of support groups  [phone]- [phone] Call for more information  Narcotics Anonymous (NA), [facility] [address], [employer] [place]  2 meetings at this location   Statistician  Address  Phone  Notes  [facility] [address]    [place]  [phone]   [facility]  [address], [place] [address], [place], [place] [phone]   [facility] [address] [place], [place] [phone] Admissions: 8am-3pm M-F  [facility] [address]    [place], [place] [phone]   [facility] [address] [place], [place], [place] [phone]   [facility] [address]  [place], [place] [phone]   [facility] - Intensive Outpatient [address], [place], [place] [phone]   [facility] ([facility] [address] [place]  [place], [place] [phone] or [phone]   [facility] ([facility]) [address], [place], [place] [phone] Accepts Medicaid  [facility] [address]  [place] [phone] Substance Abuse/Addiction Treatment   [facility] Organization         Address  Phone  Notes  [facility]  [phone]   Angie Fava, PhD [address] [place]vin Knack [place], [place]   [phone] or [phone]   [facility]   [address] [place], [place] [phone]   [facility] [address], [place], [place] [phone] Insurance/Medicaid/sponsorship through [facility] and Families [address]                                    [place], [place] [phone] Therapy/tele-psych/case  [facility] [address], [place] [phone]    Dr. Lolly Mustache  [phone]   [facility] [place]  [employer] [facility] Dept. 1) [address], [place] 2) [address], [place] 3)  [address], [place] [phone]  [phone]   [facility] Child Abuse Hotline [phone] or [phone] (After Hours)

## 2015-05-21 ENCOUNTER — Telehealth: Payer: Self-pay | Admitting: *Deleted

## 2015-05-21 NOTE — Telephone Encounter (Signed)
Pharmacy called related to Rx:  Prenatal Vit-Fe Fumarate-FA (PRENATAL COMPLETE) 14-0.4 MG TABS not in stock; requested change.Marland Kitchen.Marland Kitchen.NCM clarified with EDP to change Rx to: Prenatal Plus.

## 2015-05-23 LAB — CULTURE, GROUP A STREP: STREP A CULTURE: NEGATIVE

## 2015-06-13 ENCOUNTER — Emergency Department (HOSPITAL_COMMUNITY)
Admission: EM | Admit: 2015-06-13 | Discharge: 2015-06-13 | Disposition: A | Discharge status: Home / Self Care | Payer: Medicaid Other | Attending: Emergency Medicine | Admitting: Emergency Medicine

## 2015-06-13 ENCOUNTER — Encounter (HOSPITAL_COMMUNITY): Payer: Self-pay

## 2015-06-13 DIAGNOSIS — Z79899 Other long term (current) drug therapy: Secondary | ICD-10-CM | POA: Diagnosis not present

## 2015-06-13 DIAGNOSIS — O210 Mild hyperemesis gravidarum: Secondary | ICD-10-CM | POA: Insufficient documentation

## 2015-06-13 DIAGNOSIS — R079 Chest pain, unspecified: Secondary | ICD-10-CM | POA: Diagnosis not present

## 2015-06-13 DIAGNOSIS — O99111 Other diseases of the blood and blood-forming organs and certain disorders involving the immune mechanism complicating pregnancy, first trimester: Secondary | ICD-10-CM | POA: Insufficient documentation

## 2015-06-13 DIAGNOSIS — O99351 Diseases of the nervous system complicating pregnancy, first trimester: Secondary | ICD-10-CM | POA: Diagnosis not present

## 2015-06-13 DIAGNOSIS — G8929 Other chronic pain: Secondary | ICD-10-CM | POA: Insufficient documentation

## 2015-06-13 DIAGNOSIS — O9989 Other specified diseases and conditions complicating pregnancy, childbirth and the puerperium: Secondary | ICD-10-CM | POA: Insufficient documentation

## 2015-06-13 DIAGNOSIS — R112 Nausea with vomiting, unspecified: Secondary | ICD-10-CM

## 2015-06-13 DIAGNOSIS — D573 Sickle-cell trait: Secondary | ICD-10-CM | POA: Diagnosis not present

## 2015-06-13 DIAGNOSIS — Z3A01 Less than 8 weeks gestation of pregnancy: Secondary | ICD-10-CM | POA: Insufficient documentation

## 2015-06-13 LAB — COMPREHENSIVE METABOLIC PANEL
ALK PHOS: 50 U/L (ref 38–126)
ALT: 12 U/L — AB (ref 14–54)
AST: 14 U/L — ABNORMAL LOW (ref 15–41)
Albumin: 3.4 g/dL — ABNORMAL LOW (ref 3.5–5.0)
Anion gap: 5 (ref 5–15)
BILIRUBIN TOTAL: 0.6 mg/dL (ref 0.3–1.2)
BUN: 8 mg/dL (ref 6–20)
CALCIUM: 9.9 mg/dL (ref 8.9–10.3)
CO2: 26 mmol/L (ref 22–32)
CREATININE: 0.5 mg/dL (ref 0.44–1.00)
Chloride: 106 mmol/L (ref 101–111)
GFR calc non Af Amer: >60 mL/min (ref >60)
Glucose, Bld: 92 mg/dL (ref 65–99)
Potassium: 3.9 mmol/L (ref 3.5–5.1)
SODIUM: 137 mmol/L (ref 135–145)
TOTAL PROTEIN: 7.5 g/dL (ref 6.5–8.1)

## 2015-06-13 LAB — CBC
HCT: 34.5 % — ABNORMAL LOW (ref 36.0–46.0)
Hemoglobin: 12.5 g/dL (ref 12.0–15.0)
MCH: 27.8 pg (ref 26.0–34.0)
MCHC: 36.2 g/dL — AB (ref 30.0–36.0)
MCV: 76.8 fL — ABNORMAL LOW (ref 78.0–100.0)
PLATELETS: 327 10*3/uL (ref 150–400)
RBC: 4.49 MIL/uL (ref 3.87–5.11)
RDW: 13.6 % (ref 11.5–15.5)
WBC: 9.6 10*3/uL (ref 4.0–10.5)

## 2015-06-13 LAB — URINALYSIS, ROUTINE W REFLEX MICROSCOPIC
Glucose, UA: NEGATIVE mg/dL
Ketones, ur: NEGATIVE mg/dL
Nitrite: NEGATIVE
PROTEIN: NEGATIVE mg/dL
Specific Gravity, Urine: 1.035 — ABNORMAL HIGH (ref 1.005–1.030)
pH: 5.5 (ref 5.0–8.0)

## 2015-06-13 LAB — I-STAT BETA HCG BLOOD, ED (MC, WL, AP ONLY)

## 2015-06-13 LAB — URINE MICROSCOPIC-ADD ON

## 2015-06-13 LAB — LIPASE, BLOOD: Lipase: 26 U/L (ref 11–51)

## 2015-06-13 MED ORDER — METOCLOPRAMIDE HCL 10 MG PO TABS: 10.0000 mg | ORAL_TABLET | Freq: Four times a day (QID) | ORAL | Status: DC | PRN

## 2015-06-13 MED ORDER — CEPHALEXIN 500 MG PO CAPS
500.0000 mg | ORAL_CAPSULE | Freq: Once | ORAL | Status: AC
  Administered 2015-06-13: 500 mg via ORAL
  Filled 2015-06-13: qty 1

## 2015-06-13 MED ORDER — METOCLOPRAMIDE HCL 5 MG/ML IJ SOLN
10.0000 mg | Freq: Once | INTRAMUSCULAR | Status: AC
  Administered 2015-06-13: 10 mg via INTRAVENOUS
  Filled 2015-06-13: qty 2

## 2015-06-13 MED ORDER — OMEPRAZOLE 20 MG PO CPDR
20.0000 mg | DELAYED_RELEASE_CAPSULE | Freq: Every day | ORAL | Status: DC
Start: 2015-06-13 — End: 2015-07-23

## 2015-06-13 MED ORDER — SODIUM CHLORIDE 0.9 % IV BOLUS (SEPSIS)
1000.0000 mL | Freq: Once | INTRAVENOUS | Status: AC
  Administered 2015-06-13: 1000 mL via INTRAVENOUS

## 2015-06-13 MED ORDER — GI COCKTAIL ~~LOC~~
30.0000 mL | Freq: Once | ORAL | Status: AC
  Administered 2015-06-13: 30 mL via ORAL
  Filled 2015-06-13: qty 30

## 2015-06-13 MED ORDER — RANITIDINE HCL 150 MG PO CAPS: 150.0000 mg | ORAL_CAPSULE | Freq: Every day | ORAL | Status: DC

## 2015-06-13 MED ORDER — CEPHALEXIN 500 MG PO CAPS
500.0000 mg | ORAL_CAPSULE | Freq: Three times a day (TID) | ORAL | Status: DC
Start: 2015-06-13 — End: 2015-07-16

## 2015-06-13 NOTE — ED Provider Notes (Signed)
CSN: [id number]     Arrival date & time 06/13/15  1707 History   First MD Initiated Contact with Patient 06/13/15 2035     Chief Complaint  Patient presents with  . Nausea  . Emesis During Pregnancy     (Consider location/radiation/quality/duration/timing/severity/associated sxs/prior Treatment) Patient is a 34 y.o. female presenting with vomiting.  Emesis Severity:  Mild Duration:  3 weeks Timing:  Intermittent Quality:  Stomach contents Able to tolerate:  Liquids Progression:  Unchanged Chronicity:  Recurrent Recent urination:  Normal Context: not post-tussive and not self-induced   Relieved by:  Nothing Ineffective treatments:  Liquids Associated symptoms: no abdominal pain, no chills, no diarrhea, no fever, no headaches and no sore throat     Past Medical History  Diagnosis Date  . Sickle cell trait (HCC)   . Low back pain   . Chronic headaches    History reviewed. No pertinent past surgical history. Family History  Problem Relation Age of Onset  . Family history unknown: Yes   Social History  Substance Use Topics  . Smoking status: Never Smoker   . Smokeless tobacco: None  . Alcohol Use: No   OB History    Gravida Para Term Preterm AB TAB SAB Ectopic Multiple Living   1              Review of Systems  Constitutional: Negative for fever, chills and fatigue.  HENT: Negative for sore throat.   Respiratory: Negative for cough and shortness of breath.   Cardiovascular: Positive for chest pain (sharp retrosternal burning).  Gastrointestinal: Positive for nausea and vomiting. Negative for abdominal pain, diarrhea and constipation.  Neurological: Negative for headaches.  All other systems reviewed and are negative.     Allergies  Review of patient's allergies indicates no known allergies.  Home Medications   Prior to Admission medications   Medication Sig Start Date End Date Taking? Authorizing Provider  acetaminophen (TYLENOL) 500 MG tablet Take  1,000 mg by mouth every 6 (six) hours as needed for mild pain.   Yes Historical Provider, MD  Prenatal Vit-Fe Fumarate-FA (PRENATAL COMPLETE) 14-0.4 MG TABS Take 1 tablet by mouth daily. 05/20/15  Yes Mady GemmaElizabeth C Westfall, PA-C  sucralfate (CARAFATE) 1 G tablet Take 1 tablet (1 g total) by mouth 4 (four) times daily -  with meals and at bedtime. 05/20/15  Yes Mady GemmaElizabeth C Westfall, PA-C  cephALEXin (KEFLEX) 500 MG capsule Take 1 capsule (500 mg total) by mouth 3 (three) times daily. 06/13/15   Marily MemosJason Kae Lauman, MD  metoCLOPramide (REGLAN) 10 MG tablet Take 1 tablet (10 mg total) by mouth every 6 (six) hours as needed for refractory nausea / vomiting. 06/13/15   Marily MemosJason Jediah Horger, MD  omeprazole (PRILOSEC) 20 MG capsule Take 1 capsule (20 mg total) by mouth daily. 06/13/15   Marily MemosJason Auna Mikkelsen, MD  ranitidine (ZANTAC) 150 MG capsule Take 1 capsule (150 mg total) by mouth daily. 06/13/15   Marily MemosJason Bindi Klomp, MD   BP 126/67 mmHg  Pulse 95  Temp(Src) 98.2 F (36.8 C) (Oral)  Resp 18  SpO2 100%  LMP 02/21/2015 Physical Exam  Constitutional: She is oriented to person, place, and time. She appears well-developed and well-nourished.  HENT:  Head: Normocephalic and atraumatic.  Neck: Normal range of motion.  Cardiovascular: Normal rate and regular rhythm.   Pulmonary/Chest: Effort normal and breath sounds normal. No stridor. No respiratory distress.  Abdominal: Soft. She exhibits no distension. There is no tenderness.  Musculoskeletal: Normal range of motion.  She exhibits no edema or tenderness.  Neurological: She is alert and oriented to person, place, and time. No cranial nerve deficit. Coordination normal.  Skin: Skin is warm and dry.  Nursing note and vitals reviewed.   ED Course  Procedures (including critical care time) Labs Review Labs Reviewed  COMPREHENSIVE METABOLIC PANEL - Abnormal; Notable for the following:    Albumin 3.4 (*)    AST 14 (*)    ALT 12 (*)    All other components within normal limits   CBC - Abnormal; Notable for the following:    HCT 34.5 (*)    MCV 76.8 (*)    MCHC 36.2 (*)    All other components within normal limits  URINALYSIS, ROUTINE W REFLEX MICROSCOPIC (NOT AT [facility]) - Abnormal; Notable for the following:    Color, Urine AMBER (*)    APPearance TURBID (*)    Specific Gravity, Urine 1.035 (*)    Hgb urine dipstick TRACE (*)    Bilirubin Urine SMALL (*)    Leukocytes, UA SMALL (*)    All other components within normal limits  URINE MICROSCOPIC-ADD ON - Abnormal; Notable for the following:    Squamous Epithelial / LPF 6-30 (*)    Bacteria, UA FEW (*)    Crystals CA OXALATE CRYSTALS (*)    All other components within normal limits  I-STAT BETA HCG BLOOD, ED ([facility], [facility], [facility] ONLY) - Abnormal; Notable for the following:    I-stat hCG, quantitative >2000.0 (*)    All other components within normal limits  LIPASE, BLOOD    Imaging Review No results found. I have personally reviewed and evaluated these images and lab results as part of my medical decision-making.   EKG Interpretation None      MDM   Final diagnoses:  Non-intractable vomiting with nausea, vomiting of unspecified type   Likely combination of hyperemesis gravidarum and severe reflux. We'll treat for both. Has a history of the same. Doubt intra-abdominal pathology however will do screening labs. Could possibly have UTI so we'll check that.  Patient's vomiting controlled in the emergency department able tolerate by mouth fluids and medications. We will add Zantac to her omeprazole for likely esophagitis versus gastritis. I doubt that she has gallbladder disease at this time. Also give her prescription for Reglan and she also has bacteriuria so gave her prescription for Keflex. Has appointment with obstetrics severity scheduled and will continue to follow up with them.     Marily Memos, MD 06/13/15 2232

## 2015-06-13 NOTE — ED Notes (Signed)
Patient c/o nausea/vomiting since seen last x3 weeks.  Patient states that is unable to keep anything down liquids or solids down.  States that has pain in her throat states that on last visit gave her Protonix on last visit and symptoms are unrelieved.  Has an appointment with OBGYN on December 12 and was unable to wait to be seen.  States pain 10/10.  Breathing even and unlabored NAD at this time.

## 2015-06-25 ENCOUNTER — Encounter: Payer: Self-pay | Admitting: Family Medicine

## 2015-06-25 ENCOUNTER — Ambulatory Visit (INDEPENDENT_AMBULATORY_CARE_PROVIDER_SITE_OTHER): Payer: Self-pay | Admitting: Family Medicine

## 2015-06-25 VITALS — BP 108/69 | HR 90 | Temp 98.5°F | Wt 205.7 lb

## 2015-06-25 DIAGNOSIS — Z1151 Encounter for screening for human papillomavirus (HPV): Secondary | ICD-10-CM

## 2015-06-25 DIAGNOSIS — O0990 Supervision of high risk pregnancy, unspecified, unspecified trimester: Secondary | ICD-10-CM | POA: Insufficient documentation

## 2015-06-25 DIAGNOSIS — Z124 Encounter for screening for malignant neoplasm of cervix: Secondary | ICD-10-CM

## 2015-06-25 DIAGNOSIS — O210 Mild hyperemesis gravidarum: Secondary | ICD-10-CM

## 2015-06-25 DIAGNOSIS — O0993 Supervision of high risk pregnancy, unspecified, third trimester: Secondary | ICD-10-CM

## 2015-06-25 DIAGNOSIS — Z113 Encounter for screening for infections with a predominantly sexual mode of transmission: Secondary | ICD-10-CM

## 2015-06-25 DIAGNOSIS — Z23 Encounter for immunization: Secondary | ICD-10-CM

## 2015-06-25 LAB — POCT URINALYSIS DIP (DEVICE)
Glucose, UA: NEGATIVE mg/dL
KETONES UR: NEGATIVE mg/dL
Leukocytes, UA: NEGATIVE
Nitrite: NEGATIVE
PH: 5.5 (ref 5.0–8.0)
PROTEIN: 100 mg/dL — AB
Urobilinogen, UA: 1 mg/dL (ref 0.0–1.0)

## 2015-06-25 NOTE — Progress Notes (Signed)
Subjective:  Anna Walls is a 34 y.o. G5P4003 at 5688w2d being seen today for ongoing prenatal care.  She is currently monitored for the following issues for this low-risk pregnancy and has Supervision of high risk pregnancy, antepartum and Hyperemesis affecting pregnancy, antepartum on her problem list.  Patient reports nasuea and spitting.  Contractions: Not present. Vag. Bleeding: None.  Movement: Present. Denies leaking of fluid.   The following portions of the patient's history were reviewed and updated as appropriate: allergies, current medications, past family history, past medical history, past social history, past surgical history and problem list. Problem list updated.  Objective:   Filed Vitals:   06/25/15 0906  BP: 108/69  Pulse: 90  Temp: 98.5 F (36.9 C)  Weight: 205 lb 11.2 oz (93.305 kg)    Fetal Status:     Movement: Present     General:  Alert, oriented and cooperative. Patient is in no acute distress.  Skin: Skin is warm and dry. No rash noted.   Cardiovascular: Normal heart rate noted  Respiratory: Normal respiratory effort, no problems with respiration noted  Abdomen: Soft, gravid, appropriate for gestational age. Pain/Pressure: Absent     Pelvic: Vag. Bleeding: None Vag D/C Character: White  Multiparous cervix. Redundant vaginal tissue.  Cervical exam deferred        Extremities: Normal range of motion.  Edema: None  Mental Status: Normal mood and affect. Normal behavior. Normal judgment and thought content.   Urinalysis: Urine Protein: 2+ Urine Glucose: 1+  Assessment and Plan:  Pregnancy: G5P4003 at 188w2d  1. Supervision of high risk pregnancy, antepartum, third trimester - Wet prep, genital - Cytology - PAP - GC/Chlamydia probe amp ([place])not [facility] - Glucose Tolerance, 1 HR (50g) w/o Fasting - Prenatal Profile - Hemoglobinopathy evaluation - Prescript Monitor Profile(19) - Culture, OB Urine - Flu Vaccine QUAD 36+ mos IM; Standing - Flu  Vaccine QUAD 36+ mos IM  2. Hyperemesis affecting pregnancy, antepartum - recommended use of B6 and unisom - Frequent small meals - use of ginger products  Preterm labor symptoms and general obstetric precautions including but not limited to vaginal bleeding, contractions, leaking of fluid and fetal movement were reviewed in detail with the patient. Please refer to After Visit Summary for other counseling recommendations.  Return in about 4 weeks (around 07/23/2015) for Routine prenatal care.   Federico FlakeKimberly Niles [place], MD

## 2015-06-25 NOTE — Patient Instructions (Signed)
Nausea medication to take during pregnancy:  Unisom (doxylamine succinate 25 mg tablets) Take one tablet daily at bedtime. If symptoms are not adequately controlled, the dose can be increased to a maximum recommended dose of two tablets daily (1/2 tablet in the morning, 1/2 tablet mid-afternoon and one at bedtime). Vitamin B6 100mg  tablets. Take one tablet twice a day (up to 200 mg per day). Hyperemesis Gravidarum Hyperemesis gravidarum is a severe form of nausea and vomiting that happens during pregnancy. Hyperemesis is worse than morning sickness. It may cause you to have nausea or vomiting all day for many days. It may keep you from eating and drinking enough food and liquids. Hyperemesis usually occurs during the first half (the first 20 weeks) of pregnancy. It often goes away once a woman is in her second half of pregnancy. However, sometimes hyperemesis continues through an entire pregnancy.  CAUSES  The cause of this condition is not completely known but is thought to be related to changes in the body's hormones when pregnant. It could be from the high level of the pregnancy hormone or an increase in estrogen in the body.  SIGNS AND SYMPTOMS   Severe nausea and vomiting.  Nausea that does not go away.  Vomiting that does not allow you to keep any food down.  Weight loss and body fluid loss (dehydration).  Having no desire to eat or not liking food you have previously enjoyed. DIAGNOSIS  Your health care provider will do a physical exam and ask you about your symptoms. He or she may also order blood tests and urine tests to make sure something else is not causing the problem.  TREATMENT  You may only need medicine to control the problem. If medicines do not control the nausea and vomiting, you will be treated in the hospital to prevent dehydration, increased acid in the blood (acidosis), weight loss, and changes in the electrolytes in your body that may harm the unborn baby (fetus). You  may need IV fluids.  HOME CARE INSTRUCTIONS   Only take over-the-counter or prescription medicines as directed by your health care provider.  Try eating a couple of dry crackers or toast in the morning before getting out of bed.  Avoid foods and smells that upset your stomach.  Avoid fatty and spicy foods.  Eat 5-6 small meals a day.  Do not drink when eating meals. Drink between meals.  For snacks, eat high-protein foods, such as cheese.  Eat or suck on things that have ginger in them. Ginger helps nausea.  Avoid food preparation. The smell of food can spoil your appetite.  Avoid iron pills and iron in your multivitamins until after 3-4 months of being pregnant. However, consult with your health care provider before stopping any prescribed iron pills. SEEK MEDICAL CARE IF:   Your abdominal pain increases.  You have a severe headache.  You have vision problems.  You are losing weight. SEEK IMMEDIATE MEDICAL CARE IF:   You are unable to keep fluids down.  You vomit blood.  You have constant nausea and vomiting.  You have excessive weakness.  You have extreme thirst.  You have dizziness or fainting.  You have a fever or persistent symptoms for more than 2-3 days.  You have a fever and your symptoms suddenly get worse. MAKE SURE YOU:   Understand these instructions.  Will watch your condition.  Will get help right away if you are not doing well or get worse.   This information  is not intended to replace advice given to you by your health care provider. Make sure you discuss any questions you have with your health care provider.   Document Released: 06/30/2005 Document Revised: 04/20/2013 Document Reviewed: 02/09/2013 Elsevier Interactive Patient Education 2016 [employer].   Safe Medications in Pregnancy   Acne: Benzoyl Peroxide Salicylic Acid  Backache/Headache: Tylenol: 2 regular strength every 4 hours OR              2 Extra strength every 6  hours  Colds/Coughs/Allergies: Benadryl (alcohol free) 25 mg every 6 hours as needed Breath right strips Claritin Cepacol throat lozenges Chloraseptic throat spray Cold-Eeze- up to three times per day Cough drops, alcohol free Flonase (by prescription only) Guaifenesin Mucinex Robitussin DM (plain only, alcohol free) Saline nasal spray/drops Sudafed (pseudoephedrine) & Actifed ** use only after [redacted] weeks gestation and if you do not have high blood pressure Tylenol Vicks Vaporub Zinc lozenges Zyrtec   Constipation: Colace Ducolax suppositories Fleet enema Glycerin suppositories Metamucil Milk of magnesia Miralax Senokot Smooth move tea  Diarrhea: Kaopectate Imodium A-D  *NO pepto Bismol  Hemorrhoids: Anusol Anusol HC Preparation H Tucks  Indigestion: Tums Maalox Mylanta Zantac  Pepcid  Insomnia: Benadryl (alcohol free)  every 6 hours as needed Tylenol PM Unisom, no Gelcaps  Leg Cramps: Tums MagGel  Nausea/Vomiting:  Bonine Dramamine Emetrol Ginger extract Sea bands Meclizine   Nausea medication to take during pregnancy:  Unisom (doxylamine succinate 25 mg tablets) Take one tablet daily at bedtime. If symptoms are not adequately controlled, the dose can be increased to a maximum recommended dose of two tablets daily (1/2 tablet in the morning, 1/2 tablet mid-afternoon and one at bedtime). Vitamin B6  tablets. Take one tablet twice a day (up to 200 mg per day).  Skin Rashes: Aveeno products Benadryl cream or  every 6 hours as needed Calamine Lotion 1% cortisone cream  Yeast infection: Gyne-lotrimin 7 Monistat 7   **If taking multiple medications, please check labels to avoid duplicating the same active ingredients **take medication as directed on the label ** Do not exceed 4000 mg of tylenol in 24 hours **Do not take medications that contain aspirin or ibuprofen

## 2015-06-25 NOTE — Progress Notes (Signed)
Urine: trace hgb Initial prenatal labs, 1hr gtt today Flu today C/o itchy white discharge Breastfeeding tip of the week reviewed

## 2015-06-26 ENCOUNTER — Telehealth: Payer: Self-pay | Admitting: *Deleted

## 2015-06-26 DIAGNOSIS — O0993 Supervision of high risk pregnancy, unspecified, third trimester: Secondary | ICD-10-CM

## 2015-06-26 LAB — PRENATAL PROFILE (SOLSTAS)
ANTIBODY SCREEN: NEGATIVE
Basophils Absolute: 0 10*3/uL (ref 0.0–0.1)
Basophils Relative: 0 % (ref 0–1)
Eosinophils Absolute: 0.4 10*3/uL (ref 0.0–0.7)
Eosinophils Relative: 5 % (ref 0–5)
HEMATOCRIT: 32.7 % — AB (ref 36.0–46.0)
HEMOGLOBIN: 11.7 g/dL — AB (ref 12.0–15.0)
HEP B S AG: NEGATIVE
HIV 1&2 Ab, 4th Generation: NONREACTIVE
LYMPHS PCT: 23 % (ref 12–46)
Lymphs Abs: 1.7 10*3/uL (ref 0.7–4.0)
MCH: 28.1 pg (ref 26.0–34.0)
MCHC: 35.8 g/dL (ref 30.0–36.0)
MCV: 78.4 fL (ref 78.0–100.0)
MONO ABS: 0.4 10*3/uL (ref 0.1–1.0)
MONOS PCT: 5 % (ref 3–12)
MPV: 9.5 fL (ref 8.6–12.4)
NEUTROS ABS: 5.1 10*3/uL (ref 1.7–7.7)
Neutrophils Relative %: 67 % (ref 43–77)
Platelets: 294 10*3/uL (ref 150–400)
RBC: 4.17 MIL/uL (ref 3.87–5.11)
RDW: 14.4 % (ref 11.5–15.5)
Rh Type: POSITIVE
Rubella: 17.1 Index — ABNORMAL HIGH (ref <0.90)
WBC: 7.6 10*3/uL (ref 4.0–10.5)

## 2015-06-26 LAB — HEMOGLOBINOPATHY EVALUATION
HEMOGLOBIN OTHER: 37.6 % — AB
HGB A2 QUANT: 3 % (ref 2.2–3.2)
HGB S QUANTITAION: 0 %
Hgb A: 59.4 % — ABNORMAL LOW (ref 96.8–97.8)
Hgb F Quant: 0 % (ref 0.0–2.0)

## 2015-06-26 LAB — WET PREP, GENITAL: TRICH WET PREP: NONE SEEN

## 2015-06-26 LAB — CYTOLOGY - PAP

## 2015-06-26 LAB — GC/CHLAMYDIA PROBE AMP (~~LOC~~) NOT AT ARMC
Chlamydia: NEGATIVE
Neisseria Gonorrhea: NEGATIVE

## 2015-06-26 LAB — GLUCOSE TOLERANCE, 1 HOUR (50G) W/O FASTING: GLUCOSE 1 HOUR GTT: 117 mg/dL (ref 70–140)

## 2015-06-26 LAB — CULTURE, OB URINE

## 2015-06-26 NOTE — Telephone Encounter (Signed)
Pt left without scheduling anatomy scan.

## 2015-06-27 LAB — PRESCRIPTION MONITORING PROFILE (19 PANEL)
Amphetamine/Meth: NEGATIVE ng/mL
BENZODIAZEPINE SCREEN, URINE: NEGATIVE ng/mL
Barbiturate Screen, Urine: NEGATIVE ng/mL
Buprenorphine, Urine: NEGATIVE ng/mL
Cannabinoid Scrn, Ur: NEGATIVE ng/mL
Carisoprodol, Urine: NEGATIVE ng/mL
Cocaine Metabolites: NEGATIVE ng/mL
Creatinine, Urine: 478.87 mg/dL (ref >20.0)
ECSTASY: NEGATIVE ng/mL
FENTANYL URINE: NEGATIVE ng/mL
Meperidine, Ur: NEGATIVE ng/mL
Methadone Screen, Urine: NEGATIVE ng/mL
Methaqualone: NEGATIVE ng/mL
NITRITES URINE, INITIAL: NEGATIVE ug/mL
OPIATE SCREEN, URINE: NEGATIVE ng/mL
Oxycodone Screen, Ur: NEGATIVE ng/mL
PROPOXYPHENE: NEGATIVE ng/mL
Phencyclidine, Ur: NEGATIVE ng/mL
TAPENTADOLUR: NEGATIVE ng/mL
TRAMADOL UR: NEGATIVE ng/mL
ZOLPIDEM, URINE: NEGATIVE ng/mL
pH, Initial: 5.8 pH (ref 4.5–8.9)

## 2015-06-29 LAB — HGB ELECTROPHORESIS REFLEXED REPORT
HEMOGLOBIN F - HGBRFX: 0 % (ref <2.0)
Hemoglobin A - HGBRFX: 55 % — ABNORMAL LOW (ref >96.0)
Hemoglobin A2 - HGBRFX: 3 % (ref 1.8–3.5)
Hemoglobin Elect C: 42 % — ABNORMAL HIGH
SICKLE SOLUBILITY TEST - HGBRFX: NEGATIVE

## 2015-07-04 ENCOUNTER — Telehealth: Payer: Self-pay | Admitting: General Practice

## 2015-07-04 DIAGNOSIS — O210 Mild hyperemesis gravidarum: Secondary | ICD-10-CM

## 2015-07-04 MED ORDER — METOCLOPRAMIDE HCL 10 MG PO TABS: 10.0000 mg | ORAL_TABLET | Freq: Four times a day (QID) | ORAL | Status: DC | PRN

## 2015-07-04 NOTE — Telephone Encounter (Signed)
Patient called and left message stating she ran out of her nausea/vomiting medication recently and needs a refill. Patient states the unisom just makes her sleepy and the b6 isn't helping. Per Dorathy KinsmanVirginia Smith, may refill reglan. Patient should be instructed to take b6 and doxylamine 25mg  at night and a half pill in the morning if she can tolerate it. Called patient and informed her of refill and reviewed b6 and unisom instructions. Patient verbalized understanding to all and had no questions

## 2015-07-15 ENCOUNTER — Encounter (HOSPITAL_COMMUNITY): Payer: Self-pay | Admitting: *Deleted

## 2015-07-15 ENCOUNTER — Inpatient Hospital Stay (HOSPITAL_COMMUNITY)
Admission: EM | Admit: 2015-07-15 | Discharge: 2015-07-16 | Disposition: A | Discharge status: Home / Self Care | Payer: Medicaid Other | Source: Ambulatory Visit | Attending: Obstetrics and Gynecology | Admitting: Obstetrics and Gynecology

## 2015-07-15 DIAGNOSIS — Z3A16 16 weeks gestation of pregnancy: Secondary | ICD-10-CM | POA: Insufficient documentation

## 2015-07-15 DIAGNOSIS — O98512 Other viral diseases complicating pregnancy, second trimester: Secondary | ICD-10-CM | POA: Insufficient documentation

## 2015-07-15 DIAGNOSIS — O210 Mild hyperemesis gravidarum: Secondary | ICD-10-CM | POA: Diagnosis not present

## 2015-07-15 DIAGNOSIS — D573 Sickle-cell trait: Secondary | ICD-10-CM | POA: Diagnosis not present

## 2015-07-15 DIAGNOSIS — Z0189 Encounter for other specified special examinations: Secondary | ICD-10-CM | POA: Insufficient documentation

## 2015-07-15 DIAGNOSIS — J069 Acute upper respiratory infection, unspecified: Secondary | ICD-10-CM | POA: Insufficient documentation

## 2015-07-15 DIAGNOSIS — R51 Headache: Secondary | ICD-10-CM | POA: Insufficient documentation

## 2015-07-15 NOTE — MAU Note (Signed)
Pt reports she has had a sore throat, bilateral earaches, fever at times, nasal congestion and cough for 4 days.

## 2015-07-15 NOTE — L&D Delivery Note (Signed)
Delivery Note At 4:03 PM a viable female was delivered via Vaginal, Spontaneous Delivery (Presentation: OA w/restitution to ROT).  APGAR: 8, 9; weight pending.   Placenta status: Intact, Spontaneous.  Cord: 3 vessels with the following complications: nuchal x1, reduced.  Cord pH: n/a  Anesthesia: Epidural  Episiotomy: None Lacerations: None Est. Blood Loss (mL): 100    Mom to postpartum.  Baby to Couplet care / Skin to Skin.  Donette LarryMelanie Tanaka Gillen, CNM 12/27/2015, 4:26 PM

## 2015-07-16 DIAGNOSIS — J069 Acute upper respiratory infection, unspecified: Secondary | ICD-10-CM

## 2015-07-16 DIAGNOSIS — O219 Vomiting of pregnancy, unspecified: Secondary | ICD-10-CM

## 2015-07-16 LAB — URINALYSIS, ROUTINE W REFLEX MICROSCOPIC
BILIRUBIN URINE: NEGATIVE
GLUCOSE, UA: NEGATIVE mg/dL
HGB URINE DIPSTICK: NEGATIVE
KETONES UR: NEGATIVE mg/dL
Nitrite: NEGATIVE
PROTEIN: NEGATIVE mg/dL
Specific Gravity, Urine: 1.01 (ref 1.005–1.030)
pH: 6 (ref 5.0–8.0)

## 2015-07-16 LAB — INFLUENZA PANEL BY PCR (TYPE A & B)
H1N1 flu by pcr: NOT DETECTED
INFLAPCR: NEGATIVE
Influenza B By PCR: NEGATIVE

## 2015-07-16 LAB — URINE MICROSCOPIC-ADD ON

## 2015-07-16 LAB — RAPID STREP SCREEN (MED CTR MEBANE ONLY): Streptococcus, Group A Screen (Direct): NEGATIVE

## 2015-07-16 MED ORDER — METOCLOPRAMIDE HCL 10 MG PO TABS: 10.0000 mg | ORAL_TABLET | Freq: Four times a day (QID) | ORAL | Status: DC | PRN

## 2015-07-16 MED ORDER — PSEUDOEPHEDRINE HCL 30 MG PO TABS
60.0000 mg | ORAL_TABLET | Freq: Once | ORAL | Status: AC
  Administered 2015-07-16: 60 mg via ORAL
  Filled 2015-07-16: qty 2

## 2015-07-16 MED ORDER — ACETAMINOPHEN 500 MG PO TABS
1000.0000 mg | ORAL_TABLET | Freq: Once | ORAL | Status: AC
  Administered 2015-07-16: 1000 mg via ORAL
  Filled 2015-07-16: qty 2

## 2015-07-16 NOTE — MAU Provider Note (Signed)
History     CSN: [id number]  Arrival date and time: 07/15/15 2258   First Provider Initiated Contact with Patient 07/16/15 0001      Chief Complaint  Patient presents with  . URI   URI  This is a new problem. Episode onset: 4 days ago. The problem has been gradually worsening. Maximum temperature: subjective fever. Patient has not taken temp at home, but reports chills and hot flashes.  The fever has been present for less than 1 day. Associated symptoms include coughing, ear pain, headaches and a sore throat. Pertinent negatives include no abdominal pain, diarrhea, nausea or vomiting. She has tried decongestant for the symptoms. The treatment provided no relief.   Anna Walls is a 35 y.o. G5P4003 at [redacted]w[redacted]d. She denies any abdominal pain, vaginal bleeding or LOF.  Past Medical History  Diagnosis Date  . Sickle cell trait (HCC)   . Low back pain   . Chronic headaches   . Hyperemesis     History reviewed. No pertinent past surgical history.  Family History  Problem Relation Age of Onset  . Family history unknown: Yes    Social History  Substance Use Topics  . Smoking status: Never Smoker   . Smokeless tobacco: Never Used  . Alcohol Use: No    Allergies: No Known Allergies  Prescriptions prior to admission  Medication Sig Dispense Refill Last Dose  . Chlorphen-Phenyleph-Ibuprofen (ADVIL ALLERGY & CONGESTION PO) Take by mouth.   07/15/2015 at Unknown time  . metoCLOPramide (REGLAN) 10 MG tablet Take 1 tablet (10 mg total) by mouth every 6 (six) hours as needed for refractory nausea / vomiting. 30 tablet 0 07/15/2015 at Unknown time  . omeprazole (PRILOSEC) 20 MG capsule Take 1 capsule (20 mg total) by mouth daily. 30 capsule 0 07/14/2015 at Unknown time  . Prenatal Vit-Fe Fumarate-FA (PRENATAL COMPLETE) 14-0.4 MG TABS Take 1 tablet by mouth daily. 60 each 1 07/14/2015 at Unknown time  . ranitidine (ZANTAC) 150 MG capsule Take 1 capsule (150 mg total) by mouth daily. 30  capsule 0 07/15/2015 at Unknown time  . acetaminophen (TYLENOL) 500 MG tablet Take 1,000 mg by mouth every 6 (six) hours as needed for mild pain.   Taking  . cephALEXin (KEFLEX) 500 MG capsule Take 1 capsule (500 mg total) by mouth 3 (three) times daily. 21 capsule 0 Taking  . sucralfate (CARAFATE) 1 G tablet Take 1 tablet (1 g total) by mouth 4 (four) times daily -  with meals and at bedtime. (Patient not taking: Reported on 06/25/2015) 30 tablet 0 Not Taking    Review of Systems  Constitutional: Positive for chills.  HENT: Positive for ear pain and sore throat.   Respiratory: Positive for cough and sputum production.   Gastrointestinal: Negative for nausea, vomiting, abdominal pain, diarrhea and constipation.  Neurological: Positive for headaches.   Physical Exam   Blood pressure 112/65, pulse 117, temperature 98 F (36.7 C), temperature source Oral, resp. rate 18, height 5\' 4"  (1.626 m), weight 93.441 kg (206 lb), last menstrual period 03/24/2015, SpO2 99 %.  Physical Exam  Nursing note and vitals reviewed. Constitutional: She is oriented to person, place, and time. She appears well-developed and well-nourished.  HENT:  Head: Normocephalic.  Mouth/Throat: Oropharynx is clear and moist. No oropharyngeal exudate.  Cardiovascular: Normal rate and regular rhythm.   Respiratory: Effort normal and breath sounds normal. She has no wheezes.  GI: Soft. There is no tenderness.  Genitourinary:  Uterus: AGA, FHT 148  with doppler   Neurological: She is alert and oriented to person, place, and time.  Skin: Skin is warm.  Psychiatric: She has a normal mood and affect.     MAU Course  Procedures  MDM   Assessment and Plan   1. Viral URI    DC home Comfort measures reviewed  Safe medications reviewed Flu/Strep pending  RX: none  Return to MAU as needed FU with OB as planned  Follow-up Information    [facility]   Specialty:  Obstetrics and Gynecology    Why:  As scheduled   Contact information:   [address] [place] [id number] [phone]        Tawnya CrookHogan, Heather Donovan 07/16/2015, 12:04 AM

## 2015-07-16 NOTE — Discharge Instructions (Signed)
Safe Medications in Pregnancy   Acne: Benzoyl Peroxide Salicylic Acid  Backache/Headache: Tylenol: 2 regular strength every 4 hours OR              2 Extra strength every 6 hours  Colds/Coughs/Allergies: Benadryl (alcohol free) 25 mg every 6 hours as needed Breath right strips Claritin Cepacol throat lozenges Chloraseptic throat spray Cold-Eeze- up to three times per day Cough drops, alcohol free Flonase (by prescription only) Guaifenesin Mucinex Robitussin DM (plain only, alcohol free) Saline nasal spray/drops Sudafed (pseudoephedrine) & Actifed ** use only after [redacted] weeks gestation and if you do not have high blood pressure Tylenol Vicks Vaporub Zinc lozenges Zyrtec   Constipation: Colace Ducolax suppositories Fleet enema Glycerin suppositories Metamucil Milk of magnesia Miralax Senokot Smooth move tea  Diarrhea: Kaopectate Imodium A-D  *NO pepto Bismol  Hemorrhoids: Anusol Anusol HC Preparation H Tucks  Indigestion: Tums Maalox Mylanta Zantac  Pepcid  Insomnia: Benadryl (alcohol free) 25mg  every 6 hours as needed Tylenol PM Unisom, no Gelcaps  Leg Cramps: Tums MagGel  Nausea/Vomiting:  Bonine Dramamine Emetrol Ginger extract Sea bands Meclizine  Nausea medication to take during pregnancy:  Unisom (doxylamine succinate 25 mg tablets) Take one tablet daily at bedtime. If symptoms are not adequately controlled, the dose can be increased to a maximum recommended dose of two tablets daily (1/2 tablet in the morning, 1/2 tablet mid-afternoon and one at bedtime). Vitamin B6 100mg  tablets. Take one tablet twice a day (up to 200 mg per day).  Skin Rashes: Aveeno products Benadryl cream or 25mg  every 6 hours as needed Calamine Lotion 1% cortisone cream  Yeast infection: Gyne-lotrimin 7 Monistat 7   **If taking multiple medications, please check labels to avoid duplicating the same active ingredients **take medication as directed on  the label ** Do not exceed 4000 mg of tylenol in 24 hours **Do not take medications that contain aspirin or ibuprofen     Upper Respiratory Infection, Adult Most upper respiratory infections (URIs) are a viral infection of the air passages leading to the lungs. A URI affects the nose, throat, and upper air passages. The most common type of URI is nasopharyngitis and is typically referred to as "the common cold." URIs run their course and usually go away on their own. Most of the time, a URI does not require medical attention, but sometimes a bacterial infection in the upper airways can follow a viral infection. This is called a secondary infection. Sinus and middle ear infections are common types of secondary upper respiratory infections. Bacterial pneumonia can also complicate a URI. A URI can worsen asthma and chronic obstructive pulmonary disease (COPD). Sometimes, these complications can require emergency medical care and may be life threatening.  CAUSES Almost all URIs are caused by viruses. A virus is a type of germ and can spread from one person to another.  RISKS FACTORS You may be at risk for a URI if:   You smoke.   You have chronic heart or lung disease.  You have a weakened defense (immune) system.   You are very young or very old.   You have nasal allergies or asthma.  You work in crowded or poorly ventilated areas.  You work in health care facilities or schools. SIGNS AND SYMPTOMS  Symptoms typically develop 2-3 days after you come in contact with a cold virus. Most viral URIs last 7-10 days. However, viral URIs from the influenza virus (flu virus) can last 14-18 days and are typically more severe.  Symptoms may include:  °· Runny or stuffy (congested) nose.   °· Sneezing.   °· Cough.   °· Sore throat.   °· Headache.   °· Fatigue.   °· Fever.   °· Loss of appetite.   °· Pain in your forehead, behind your eyes, and over your cheekbones (sinus pain). °· Muscle aches.    °DIAGNOSIS  °Your health care provider may diagnose a URI by: °· Physical exam. °· Tests to check that your symptoms are not due to another condition such as: °¨ Strep throat. °¨ Sinusitis. °¨ Pneumonia. °¨ Asthma. °TREATMENT  °A URI goes away on its own with time. It cannot be cured with medicines, but medicines may be prescribed or recommended to relieve symptoms. Medicines may help: °· Reduce your fever. °· Reduce your cough. °· Relieve nasal congestion. °HOME CARE INSTRUCTIONS  °· Take medicines only as directed by your health care provider.   °· Gargle warm saltwater or take cough drops to comfort your throat as directed by your health care provider. °· Use a warm mist humidifier or inhale steam from a shower to increase air moisture. This may make it easier to breathe. °· Drink enough fluid to keep your urine clear or pale yellow.   °· Eat soups and other clear broths and maintain good nutrition.   °· Rest as needed.   °· Return to work when your temperature has returned to normal or as your health care provider advises. You may need to stay home longer to avoid infecting others. You can also use a face mask and careful hand washing to prevent spread of the virus. °· Increase the usage of your inhaler if you have asthma.   °· Do not use any tobacco products, including cigarettes, chewing tobacco, or electronic cigarettes. If you need help quitting, ask your health care provider. °PREVENTION  °The best way to protect yourself from getting a cold is to practice good hygiene.  °· Avoid oral or hand contact with people with cold symptoms.   °· Wash your hands often if contact occurs.   °There is no clear evidence that vitamin C, vitamin E, echinacea, or exercise reduces the chance of developing a cold. However, it is always recommended to get plenty of rest, exercise, and practice good nutrition.  °SEEK MEDICAL CARE IF:  °· You are getting worse rather than better.   °· Your symptoms are not controlled by  medicine.   °· You have chills. °· You have worsening shortness of breath. °· You have brown or red mucus. °· You have yellow or brown nasal discharge. °· You have pain in your face, especially when you bend forward. °· You have a fever. °· You have swollen neck glands. °· You have pain while swallowing. °· You have white areas in the back of your throat. °SEEK IMMEDIATE MEDICAL CARE IF:  °· You have severe or persistent: °¨ Headache. °¨ Ear pain. °¨ Sinus pain. °¨ Chest pain. °· You have chronic lung disease and any of the following: °¨ Wheezing. °¨ Prolonged cough. °¨ Coughing up blood. °¨ A change in your usual mucus. °· You have a stiff neck. °· You have changes in your: °¨ Vision. °¨ Hearing. °¨ Thinking. °¨ Mood. °MAKE SURE YOU:  °· Understand these instructions. °· Will watch your condition. °· Will get help right away if you are not doing well or get worse. °  °This information is not intended to replace advice given to you by your health care provider. Make sure you discuss any questions you have with your health care provider. °  °  Document Released: 12/24/2000 Document Revised: 11/14/2014 Document Reviewed: 10/05/2013 °Elsevier Interactive Patient Education ©2016 [employer] ° °

## 2015-07-18 LAB — CULTURE, GROUP A STREP: Strep A Culture: NEGATIVE

## 2015-07-23 ENCOUNTER — Ambulatory Visit (INDEPENDENT_AMBULATORY_CARE_PROVIDER_SITE_OTHER): Payer: Self-pay | Admitting: Obstetrics & Gynecology

## 2015-07-23 VITALS — BP 109/69 | HR 96 | Temp 98.1°F | Wt 202.6 lb

## 2015-07-23 DIAGNOSIS — O210 Mild hyperemesis gravidarum: Secondary | ICD-10-CM

## 2015-07-23 DIAGNOSIS — O0992 Supervision of high risk pregnancy, unspecified, second trimester: Secondary | ICD-10-CM

## 2015-07-23 LAB — POCT URINALYSIS DIP (DEVICE)
BILIRUBIN URINE: NEGATIVE
Glucose, UA: NEGATIVE mg/dL
HGB URINE DIPSTICK: NEGATIVE
Ketones, ur: NEGATIVE mg/dL
NITRITE: NEGATIVE
PH: 6.5 (ref 5.0–8.0)
Protein, ur: NEGATIVE mg/dL
Specific Gravity, Urine: 1.025 (ref 1.005–1.030)
UROBILINOGEN UA: 2 mg/dL — AB (ref 0.0–1.0)

## 2015-07-23 MED ORDER — TERCONAZOLE 0.4 % VA CREA: 1.0000 | TOPICAL_CREAM | Freq: Every day | VAGINAL | Status: DC

## 2015-07-23 MED ORDER — OMEPRAZOLE 40 MG PO CPDR: 40.0000 mg | DELAYED_RELEASE_CAPSULE | Freq: Every day | ORAL | Status: DC

## 2015-07-23 MED ORDER — PROMETHAZINE HCL 25 MG PO TABS: 25.0000 mg | ORAL_TABLET | Freq: Four times a day (QID) | ORAL | Status: DC | PRN

## 2015-07-23 MED ORDER — RANITIDINE HCL 150 MG PO CAPS: 150.0000 mg | ORAL_CAPSULE | Freq: Two times a day (BID) | ORAL | Status: DC

## 2015-07-23 NOTE — Progress Notes (Signed)
Nutrition note: referral for continued hyperemesis Pt has h/o obesity. Pt has gained 7.6# @ 3666w2d, which is slightly > expected but pt has lost wt in the past month (~3#). Pt reports eating 2 meals/d. Pt is taking a PNV. Pt reports a lot of N&V and heartburn, which she is taking meds for both but they are still a problem - MD added new anti-nausea med today. Pt received verbal & written education on general nutrition during pregnancy. Discussed tips to decrease N&V and heartburn. Discussed wt gain goals of 11-20# or 0.5#/wk. Pt agrees to start new med for N&V and take a PNV. Pt does not have WIC but plans to apply. Pt plans to BF but has some concerns because she has had mastitis with most of her children. F/u as needed Blondell RevealLaura Damyia Strider, MS, RD, LDN, [facility]

## 2015-07-23 NOTE — Progress Notes (Signed)
Reports still having thick , white , chunky vaginal discharge. States took 3 day monistat but it didn't help.

## 2015-07-23 NOTE — Progress Notes (Signed)
Subjective: still nausea and vomiting  Anna Walls is a 35 y.o. G5P4003 at 3167w2d being seen today for ongoing prenatal care.  She is currently monitored for the following issues for this high-risk pregnancy and has Supervision of high risk pregnancy, antepartum and Hyperemesis affecting pregnancy, antepartum on her problem list.  Patient reports nausea and vomiting.  Contractions: Not present. Vag. Bleeding: None.  Movement: Present. Denies leaking of fluid.   The following portions of the patient's history were reviewed and updated as appropriate: allergies, current medications, past family history, past medical history, past social history, past surgical history and problem list. Problem list updated.  Objective:   Filed Vitals:   07/23/15 1015  BP: 109/69  Pulse: 96  Temp: 98.1 F (36.7 C)  Weight: 202 lb 9.6 oz (91.899 kg)    Fetal Status: Fetal Heart Rate (bpm): 152   Movement: Present     General:  Alert, oriented and cooperative. Patient is in no acute distress.  Skin: Skin is warm and dry. No rash noted.   Cardiovascular: Normal heart rate noted  Respiratory: Normal respiratory effort, no problems with respiration noted  Abdomen: Soft, gravid, appropriate for gestational age. Pain/Pressure: Present     Pelvic: Vag. Bleeding: None Vag D/C Character: White   Cervical exam deferred        Extremities: Normal range of motion.  Edema: Trace  Mental Status: Normal mood and affect. Normal behavior. Normal judgment and thought content.   Urinalysis: Urine Protein: Negative Urine Glucose: Negative  Assessment and Plan:  Pregnancy: G5P4003 at 4067w2d  1. Hyperemesis affecting pregnancy, antepartum Phenergan Rx given, increased her other meds - POCT urinalysis dip (device) - omeprazole (PRILOSEC) 40 MG capsule; Take 1 capsule (40 mg total) by mouth daily.  Dispense: 30 capsule; Refill: 6 - ranitidine (ZANTAC) 150 MG capsule; Take 1 capsule (150 mg total) by mouth 2 (two) times  daily.  Dispense: 60 capsule; Refill: 1 - promethazine (PHENERGAN) 25 MG tablet; Take 1 tablet (25 mg total) by mouth every 6 (six) hours as needed for nausea or vomiting.  Dispense: 30 tablet; Refill: 2  2. Supervision of high risk pregnancy, antepartum, second trimester Detailed US ordered - POCT urinalysis dip (device) - omeprazole (PRILOSEC) 40 MG capsule; Take 1 capsule (40 mg total) by mouth daily.  Dispense: 30 capsule; Refill: 6 - ranitidine (ZANTAC) 150 MG capsule; Take 1 capsule (150 mg total) by mouth 2 (two) times daily.  Dispense: 60 capsule; Refill: 1 - promethazine (PHENERGAN) 25 MG tablet; Take 1 tablet (25 mg total) by mouth every 6 (six) hours as needed for nausea or vomiting.  Dispense: 30 tablet; Refill: 2 - US OB DETAIL + 14 WK; Future  Preterm labor symptoms and general obstetric precautions including but not limited to vaginal bleeding, contractions, leaking of fluid and fetal movement were reviewed in detail with the patient. Please refer to After Visit Summary for other counseling recommendations.  Return in about 4 weeks (around 08/20/2015).   Adam PhenixJames G Mishka Stegemann, MD

## 2015-07-23 NOTE — Patient Instructions (Signed)

## 2015-08-07 ENCOUNTER — Other Ambulatory Visit: Payer: Self-pay | Admitting: Family Medicine

## 2015-08-07 ENCOUNTER — Ambulatory Visit (HOSPITAL_COMMUNITY)
Admission: RE | Admit: 2015-08-07 | Discharge: 2015-08-07 | Disposition: A | Discharge status: Home / Self Care | Payer: Medicaid Other | Source: Ambulatory Visit | Attending: Family Medicine | Admitting: Family Medicine

## 2015-08-07 DIAGNOSIS — Z36 Encounter for antenatal screening of mother: Secondary | ICD-10-CM | POA: Diagnosis not present

## 2015-08-07 DIAGNOSIS — O0992 Supervision of high risk pregnancy, unspecified, second trimester: Secondary | ICD-10-CM

## 2015-08-07 DIAGNOSIS — Z3689 Encounter for other specified antenatal screening: Secondary | ICD-10-CM

## 2015-08-07 DIAGNOSIS — Z3A19 19 weeks gestation of pregnancy: Secondary | ICD-10-CM

## 2015-08-07 DIAGNOSIS — O99212 Obesity complicating pregnancy, second trimester: Secondary | ICD-10-CM | POA: Diagnosis not present

## 2015-08-20 ENCOUNTER — Encounter: Payer: Self-pay | Admitting: Family Medicine

## 2015-08-20 ENCOUNTER — Ambulatory Visit (INDEPENDENT_AMBULATORY_CARE_PROVIDER_SITE_OTHER): Payer: Medicaid Other | Admitting: Family Medicine

## 2015-08-20 VITALS — BP 106/66 | HR 90 | Temp 97.0°F | Wt 206.2 lb

## 2015-08-20 DIAGNOSIS — O210 Mild hyperemesis gravidarum: Secondary | ICD-10-CM

## 2015-08-20 DIAGNOSIS — O0992 Supervision of high risk pregnancy, unspecified, second trimester: Secondary | ICD-10-CM

## 2015-08-20 LAB — POCT URINALYSIS DIP (DEVICE)
Bilirubin Urine: NEGATIVE
Glucose, UA: NEGATIVE mg/dL
Ketones, ur: NEGATIVE mg/dL
LEUKOCYTES UA: NEGATIVE
NITRITE: NEGATIVE
PH: 6.5 (ref 5.0–8.0)
Protein, ur: NEGATIVE mg/dL
SPECIFIC GRAVITY, URINE: 1.025 (ref 1.005–1.030)
UROBILINOGEN UA: 0.2 mg/dL (ref 0.0–1.0)

## 2015-08-20 MED ORDER — RANITIDINE HCL 150 MG PO CAPS: 150.0000 mg | ORAL_CAPSULE | Freq: Two times a day (BID) | ORAL | Status: DC

## 2015-08-20 MED ORDER — OMEPRAZOLE 40 MG PO CPDR: 40.0000 mg | DELAYED_RELEASE_CAPSULE | Freq: Every day | ORAL | Status: DC

## 2015-08-20 NOTE — Progress Notes (Signed)
Subjective:  Anna Walls is a 35 y.o. G5P4003 at [redacted]w[redacted]d being seen today for ongoing prenatal care.  She is currently monitored for the following issues for this low-risk pregnancy and has Supervision of high risk pregnancy, antepartum and Hyperemesis affecting pregnancy, antepartum on her problem list.  Patient reports denies N/V--reports continued GERD.  Contractions: Not present. Vag. Bleeding: None.  Movement: Present. Denies leaking of fluid.   The following portions of the patient's history were reviewed and updated as appropriate: allergies, current medications, past family history, past medical history, past social history, past surgical history and problem list. Problem list updated.  Objective:   Filed Vitals:   08/20/15 1011  BP: 106/66  Pulse: 90  Temp: 97 F (36.1 C)  Weight: 206 lb 3.2 oz (93.532 kg)    Fetal Status: Fetal Heart Rate (bpm): 148 Fundal Height: 22 cm Movement: Present     General:  Alert, oriented and cooperative. Patient is in no acute distress.  Skin: Skin is warm and dry. No rash noted.   Cardiovascular: Normal heart rate noted  Respiratory: Normal respiratory effort, no problems with respiration noted  Abdomen: Soft, gravid, appropriate for gestational age. Pain/Pressure: Present     Pelvic: Vag. Bleeding: None     Cervical exam deferred        Extremities: Normal range of motion.  Edema: Trace  Mental Status: Normal mood and affect. Normal behavior. Normal judgment and thought content.   Urinalysis: Urine Protein: Negative Urine Glucose: Negative  Assessment and Plan:  Pregnancy: G5P4003 at [redacted]w[redacted]d  1. Supervision of high risk pregnancy, antepartum, second trimester Refilled meds for GERD - ranitidine (ZANTAC) 150 MG capsule; Take 1 capsule (150 mg total) by mouth 2 (two) times daily.  Dispense: 60 capsule; Refill: 1 - omeprazole (PRILOSEC) 40 MG capsule; Take 1 capsule (40 mg total) by mouth daily.  Dispense: 30 capsule; Refill: 6  2.  Hyperemesis affecting pregnancy, antepartum improving - ranitidine (ZANTAC) 150 MG capsule; Take 1 capsule (150 mg total) by mouth 2 (two) times daily.  Dispense: 60 capsule; Refill: 1 - omeprazole (PRILOSEC) 40 MG capsule; Take 1 capsule (40 mg total) by mouth daily.  Dispense: 30 capsule; Refill: 6  General obstetric precautions including but not limited to vaginal bleeding, contractions, leaking of fluid and fetal movement were reviewed in detail with the patient. Please refer to After Visit Summary for other counseling recommendations.  Return in 4 weeks (on 09/17/2015) for [facility].   Reva Bores, MD

## 2015-08-20 NOTE — Patient Instructions (Signed)
Second Trimester of Pregnancy The second trimester is from week 13 through week 28, months 4 through 6. The second trimester is often a time when you feel your best. Your body has also adjusted to being pregnant, and you begin to feel better physically. Usually, morning sickness has lessened or quit completely, you may have more energy, and you may have an increase in appetite. The second trimester is also a time when the fetus is growing rapidly. At the end of the sixth month, the fetus is about 9 inches long and weighs about 1 pounds. You will likely begin to feel the baby move (quickening) between 18 and 20 weeks of the pregnancy. BODY CHANGES Your body goes through many changes during pregnancy. The changes vary from woman to woman.   Your weight will continue to increase. You will notice your lower abdomen bulging out.  You may begin to get stretch marks on your hips, abdomen, and breasts.  You may develop headaches that can be relieved by medicines approved by your health care provider.  You may urinate more often because the fetus is pressing on your bladder.  You may develop or continue to have heartburn as a result of your pregnancy.  You may develop constipation because certain hormones are causing the muscles that push waste through your intestines to slow down.  You may develop hemorrhoids or swollen, bulging veins (varicose veins).  You may have back pain because of the weight gain and pregnancy hormones relaxing your joints between the bones in your pelvis and as a result of a shift in weight and the muscles that support your balance.  Your breasts will continue to grow and be tender.  Your gums may bleed and may be sensitive to brushing and flossing.  Dark spots or blotches (chloasma, mask of pregnancy) may develop on your face. This will likely fade after the baby is born.  A dark line from your belly button to the pubic area (linea nigra) may appear. This will likely  fade after the baby is born.  You may have changes in your hair. These can include thickening of your hair, rapid growth, and changes in texture. Some women also have hair loss during or after pregnancy, or hair that feels dry or thin. Your hair will most likely return to normal after your baby is born. WHAT TO EXPECT AT YOUR PRENATAL VISITS During a routine prenatal visit:  You will be weighed to make sure you and the fetus are growing normally.  Your blood pressure will be taken.  Your abdomen will be measured to track your baby's growth.  The fetal heartbeat will be listened to.  Any test results from the previous visit will be discussed. Your health care provider may ask you:  How you are feeling.  If you are feeling the baby move.  If you have had any abnormal symptoms, such as leaking fluid, bleeding, severe headaches, or abdominal cramping.  If you are using any tobacco products, including cigarettes, chewing tobacco, and electronic cigarettes.  If you have any questions. Other tests that may be performed during your second trimester include:  Blood tests that check for:  Low iron levels (anemia).  Gestational diabetes (between 24 and 28 weeks).  Rh antibodies.  Urine tests to check for infections, diabetes, or protein in the urine.  An ultrasound to confirm the proper growth and development of the baby.  An amniocentesis to check for possible genetic problems.  Fetal screens for spina bifida   and Down syndrome.  HIV (human immunodeficiency virus) testing. Routine prenatal testing includes screening for HIV, unless you choose not to have this test. HOME CARE INSTRUCTIONS   Avoid all smoking, herbs, alcohol, and unprescribed drugs. These chemicals affect the formation and growth of the baby.  Do not use any tobacco products, including cigarettes, chewing tobacco, and electronic cigarettes. If you need help quitting, ask your health care provider. You may receive  counseling support and other resources to help you quit.  Follow your health care provider's instructions regarding medicine use. There are medicines that are either safe or unsafe to take during pregnancy.  Exercise only as directed by your health care provider. Experiencing uterine cramps is a good sign to stop exercising.  Continue to eat regular, healthy meals.  Wear a good support bra for breast tenderness.  Do not use hot tubs, steam rooms, or saunas.  Wear your seat belt at all times when driving.  Avoid raw meat, uncooked cheese, cat litter boxes, and soil used by cats. These carry germs that can cause birth defects in the baby.  Take your prenatal vitamins.  Take 1500-2000 mg of calcium daily starting at the 20th week of pregnancy until you deliver your baby.  Try taking a stool softener (if your health care provider approves) if you develop constipation. Eat more high-fiber foods, such as fresh vegetables or fruit and whole grains. Drink plenty of fluids to keep your urine clear or pale yellow.  Take warm sitz baths to soothe any pain or discomfort caused by hemorrhoids. Use hemorrhoid cream if your health care provider approves.  If you develop varicose veins, wear support hose. Elevate your feet for 15 minutes, 3-4 times a day. Limit salt in your diet.  Avoid heavy lifting, wear low heel shoes, and practice good posture.  Rest with your legs elevated if you have leg cramps or low back pain.  Visit your dentist if you have not gone yet during your pregnancy. Use a soft toothbrush to brush your teeth and be gentle when you floss.  A sexual relationship may be continued unless your health care provider directs you otherwise.  Continue to go to all your prenatal visits as directed by your health care provider. SEEK MEDICAL CARE IF:   You have dizziness.  You have mild pelvic cramps, pelvic pressure, or nagging pain in the abdominal area.  You have persistent nausea,  vomiting, or diarrhea.  You have a bad smelling vaginal discharge.  You have pain with urination. SEEK IMMEDIATE MEDICAL CARE IF:   You have a fever.  You are leaking fluid from your vagina.  You have spotting or bleeding from your vagina.  You have severe abdominal cramping or pain.  You have rapid weight gain or loss.  You have shortness of breath with chest pain.  You notice sudden or extreme swelling of your face, hands, ankles, feet, or legs.  You have not felt your baby move in over an hour.  You have severe headaches that do not go away with medicine.  You have vision changes.   This information is not intended to replace advice given to you by your health care provider. Make sure you discuss any questions you have with your health care provider.   Document Released: 06/24/2001 Document Revised: 07/21/2014 Document Reviewed: 08/31/2012 Elsevier Interactive Patient Education 2016 [employer]   Breastfeeding Deciding to breastfeed is one of the best choices you can make for you and your baby. A change   in hormones during pregnancy causes your breast tissue to grow and increases the number and size of your milk ducts. These hormones also allow proteins, sugars, and fats from your blood supply to make breast milk in your milk-producing glands. Hormones prevent breast milk from being released before your baby is born as well as prompt milk flow after birth. Once breastfeeding has begun, thoughts of your baby, as well as his or her sucking or crying, can stimulate the release of milk from your milk-producing glands.  BENEFITS OF BREASTFEEDING For Your Baby  Your first milk (colostrum) helps your baby's digestive system function better.  There are antibodies in your milk that help your baby fight off infections.  Your baby has a lower incidence of asthma, allergies, and sudden infant death syndrome.  The nutrients in breast milk are better for your baby than infant  formulas and are designed uniquely for your baby's needs.  Breast milk improves your baby's brain development.  Your baby is less likely to develop other conditions, such as childhood obesity, asthma, or type 2 diabetes mellitus. For You  Breastfeeding helps to create a very special bond between you and your baby.  Breastfeeding is convenient. Breast milk is always available at the correct temperature and costs nothing.  Breastfeeding helps to burn calories and helps you lose the weight gained during pregnancy.  Breastfeeding makes your uterus contract to its prepregnancy size faster and slows bleeding (lochia) after you give birth.   Breastfeeding helps to lower your risk of developing type 2 diabetes mellitus, osteoporosis, and breast or ovarian cancer later in life. SIGNS THAT YOUR BABY IS HUNGRY Early Signs of Hunger  Increased alertness or activity.  Stretching.  Movement of the head from side to side.  Movement of the head and opening of the mouth when the corner of the mouth or cheek is stroked (rooting).  Increased sucking sounds, smacking lips, cooing, sighing, or squeaking.  Hand-to-mouth movements.  Increased sucking of fingers or hands. Late Signs of Hunger  Fussing.  Intermittent crying. Extreme Signs of Hunger Signs of extreme hunger will require calming and consoling before your baby will be able to breastfeed successfully. Do not wait for the following signs of extreme hunger to occur before you initiate breastfeeding:  Restlessness.  A loud, strong cry.  Screaming. BREASTFEEDING BASICS Breastfeeding Initiation  Find a comfortable place to sit or lie down, with your neck and back well supported.  Place a pillow or rolled up blanket under your baby to bring him or her to the level of your breast (if you are seated). Nursing pillows are specially designed to help support your arms and your baby while you breastfeed.  Make sure that your baby's  abdomen is facing your abdomen.  Gently massage your breast. With your fingertips, massage from your chest wall toward your nipple in a circular motion. This encourages milk flow. You may need to continue this action during the feeding if your milk flows slowly.  Support your breast with 4 fingers underneath and your thumb above your nipple. Make sure your fingers are well away from your nipple and your baby's mouth.  Stroke your baby's lips gently with your finger or nipple.  When your baby's mouth is open wide enough, quickly bring your baby to your breast, placing your entire nipple and as much of the colored area around your nipple (areola) as possible into your baby's mouth.  More areola should be visible above your baby's upper lip than   below the lower lip.  Your baby's tongue should be between his or her lower gum and your breast.  Ensure that your baby's mouth is correctly positioned around your nipple (latched). Your baby's lips should create a seal on your breast and be turned out (everted).  It is common for your baby to suck about 2-3 minutes in order to start the flow of breast milk. Latching Teaching your baby how to latch on to your breast properly is very important. An improper latch can cause nipple pain and decreased milk supply for you and poor weight gain in your baby. Also, if your baby is not latched onto your nipple properly, he or she may swallow some air during feeding. This can make your baby fussy. Burping your baby when you switch breasts during the feeding can help to get rid of the air. However, teaching your baby to latch on properly is still the best way to prevent fussiness from swallowing air while breastfeeding. Signs that your baby has successfully latched on to your nipple:  Silent tugging or silent sucking, without causing you pain.  Swallowing heard between every 3-4 sucks.  Muscle movement above and in front of his or her ears while sucking. Signs  that your baby has not successfully latched on to nipple:  Sucking sounds or smacking sounds from your baby while breastfeeding.  Nipple pain. If you think your baby has not latched on correctly, slip your finger into the corner of your baby's mouth to break the suction and place it between your baby's gums. Attempt breastfeeding initiation again. Signs of Successful Breastfeeding Signs from your baby:  A gradual decrease in the number of sucks or complete cessation of sucking.  Falling asleep.  Relaxation of his or her body.  Retention of a small amount of milk in his or her mouth.  Letting go of your breast by himself or herself. Signs from you:  Breasts that have increased in firmness, weight, and size 1-3 hours after feeding.  Breasts that are softer immediately after breastfeeding.  Increased milk volume, as well as a change in milk consistency and color by the fifth day of breastfeeding.  Nipples that are not sore, cracked, or bleeding. Signs That Your Baby is Getting Enough Milk  Wetting at least 3 diapers in a 24-hour period. The urine should be clear and pale yellow by age 5 days.  At least 3 stools in a 24-hour period by age 5 days. The stool should be soft and yellow.  At least 3 stools in a 24-hour period by age 7 days. The stool should be seedy and yellow.  No loss of weight greater than 10% of birth weight during the first 3 days of age.  Average weight gain of 4-7 ounces (113-198 g) per week after age 4 days.  Consistent daily weight gain by age 5 days, without weight loss after the age of 2 weeks. After a feeding, your baby may spit up a small amount. This is common. BREASTFEEDING FREQUENCY AND DURATION Frequent feeding will help you make more milk and can prevent sore nipples and breast engorgement. Breastfeed when you feel the need to reduce the fullness of your breasts or when your baby shows signs of hunger. This is called "breastfeeding on demand." Avoid  introducing a pacifier to your baby while you are working to establish breastfeeding (the first 4-6 weeks after your baby is born). After this time you may choose to use a pacifier. Research has shown that   pacifier use during the first year of a baby's life decreases the risk of sudden infant death syndrome (SIDS). Allow your baby to feed on each breast as long as he or she wants. Breastfeed until your baby is finished feeding. When your baby unlatches or falls asleep while feeding from the first breast, offer the second breast. Because newborns are often sleepy in the first few weeks of life, you may need to awaken your baby to get him or her to feed. Breastfeeding times will vary from baby to baby. However, the following rules can serve as a guide to help you ensure that your baby is properly fed:  Newborns (babies 4 weeks of age or younger) may breastfeed every 1-3 hours.  Newborns should not go longer than 3 hours during the day or 5 hours during the night without breastfeeding.  You should breastfeed your baby a minimum of 8 times in a 24-hour period until you begin to introduce solid foods to your baby at around 6 months of age. BREAST MILK PUMPING Pumping and storing breast milk allows you to ensure that your baby is exclusively fed your breast milk, even at times when you are unable to breastfeed. This is especially important if you are going back to work while you are still breastfeeding or when you are not able to be present during feedings. Your lactation consultant can give you guidelines on how long it is safe to store breast milk. A breast pump is a machine that allows you to pump milk from your breast into a sterile bottle. The pumped breast milk can then be stored in a refrigerator or freezer. Some breast pumps are operated by hand, while others use electricity. Ask your lactation consultant which type will work best for you. Breast pumps can be purchased, but some hospitals and  breastfeeding support groups lease breast pumps on a monthly basis. A lactation consultant can teach you how to hand express breast milk, if you prefer not to use a pump. CARING FOR YOUR BREASTS WHILE YOU BREASTFEED Nipples can become dry, cracked, and sore while breastfeeding. The following recommendations can help keep your breasts moisturized and healthy:  Avoid using soap on your nipples.  Wear a supportive bra. Although not required, special nursing bras and tank tops are designed to allow access to your breasts for breastfeeding without taking off your entire bra or top. Avoid wearing underwire-style bras or extremely tight bras.  Air dry your nipples for 3-4minutes after each feeding.  Use only cotton bra pads to absorb leaked breast milk. Leaking of breast milk between feedings is normal.  Use lanolin on your nipples after breastfeeding. Lanolin helps to maintain your skin's normal moisture barrier. If you use pure lanolin, you do not need to wash it off before feeding your baby again. Pure lanolin is not toxic to your baby. You may also hand express a few drops of breast milk and gently massage that milk into your nipples and allow the milk to air dry. In the first few weeks after giving birth, some women experience extremely full breasts (engorgement). Engorgement can make your breasts feel heavy, warm, and tender to the touch. Engorgement peaks within 3-5 days after you give birth. The following recommendations can help ease engorgement:  Completely empty your breasts while breastfeeding or pumping. You may want to start by applying warm, moist heat (in the shower or with warm water-soaked hand towels) just before feeding or pumping. This increases circulation and helps the milk   flow. If your baby does not completely empty your breasts while breastfeeding, pump any extra milk after he or she is finished.  Wear a snug bra (nursing or regular) or tank top for 1-2 days to signal your body  to slightly decrease milk production.  Apply ice packs to your breasts, unless this is too uncomfortable for you.  Make sure that your baby is latched on and positioned properly while breastfeeding. If engorgement persists after 48 hours of following these recommendations, contact your health care provider or a lactation consultant. OVERALL HEALTH CARE RECOMMENDATIONS WHILE BREASTFEEDING  Eat healthy foods. Alternate between meals and snacks, eating 3 of each per day. Because what you eat affects your breast milk, some of the foods may make your baby more irritable than usual. Avoid eating these foods if you are sure that they are negatively affecting your baby.  Drink milk, fruit juice, and water to satisfy your thirst (about 10 glasses a day).  Rest often, relax, and continue to take your prenatal vitamins to prevent fatigue, stress, and anemia.  Continue breast self-awareness checks.  Avoid chewing and smoking tobacco. Chemicals from cigarettes that pass into breast milk and exposure to secondhand smoke may harm your baby.  Avoid alcohol and drug use, including marijuana. Some medicines that may be harmful to your baby can pass through breast milk. It is important to ask your health care provider before taking any medicine, including all over-the-counter and prescription medicine as well as vitamin and herbal supplements. It is possible to become pregnant while breastfeeding. If birth control is desired, ask your health care provider about options that will be safe for your baby. SEEK MEDICAL CARE IF:  You feel like you want to stop breastfeeding or have become frustrated with breastfeeding.  You have painful breasts or nipples.  Your nipples are cracked or bleeding.  Your breasts are red, tender, or warm.  You have a swollen area on either breast.  You have a fever or chills.  You have nausea or vomiting.  You have drainage other than breast milk from your nipples.  Your  breasts do not become full before feedings by the fifth day after you give birth.  You feel sad and depressed.  Your baby is too sleepy to eat well.  Your baby is having trouble sleeping.   Your baby is wetting less than 3 diapers in a 24-hour period.  Your baby has less than 3 stools in a 24-hour period.  Your baby's skin or the white part of his or her eyes becomes yellow.   Your baby is not gaining weight by 5 days of age. SEEK IMMEDIATE MEDICAL CARE IF:  Your baby is overly tired (lethargic) and does not want to wake up and feed.  Your baby develops an unexplained fever.   This information is not intended to replace advice given to you by your health care provider. Make sure you discuss any questions you have with your health care provider.   Document Released: 06/30/2005 Document Revised: 03/21/2015 Document Reviewed: 12/22/2012 Elsevier Interactive Patient Education 2016 [employer]  

## 2015-09-12 ENCOUNTER — Telehealth: Payer: Self-pay | Admitting: *Deleted

## 2015-09-12 NOTE — Telephone Encounter (Signed)
Pt left message stating that she has had a sore throat and a cold for 1 week. She has been taking tylenol and using sore throat spray with no help with the pain. She also stated that the throat is yellow. She requested Rx for antibiotic. I called pt after consult with Dr. Debroah Loop and advised her that she needs evaluation @ [facility]. Medications cannot be prescribed without an evaluation. Pt voiced understanding and stated that she will try to go there today or tomorrow.

## 2015-09-13 ENCOUNTER — Emergency Department (HOSPITAL_COMMUNITY): Payer: Medicaid Other

## 2015-09-13 ENCOUNTER — Encounter (HOSPITAL_COMMUNITY): Payer: Self-pay

## 2015-09-13 ENCOUNTER — Emergency Department (HOSPITAL_COMMUNITY)
Admission: EM | Admit: 2015-09-13 | Discharge: 2015-09-13 | Disposition: A | Discharge status: Home / Self Care | Payer: Medicaid Other | Attending: Emergency Medicine | Admitting: Emergency Medicine

## 2015-09-13 DIAGNOSIS — Z862 Personal history of diseases of the blood and blood-forming organs and certain disorders involving the immune mechanism: Secondary | ICD-10-CM | POA: Diagnosis not present

## 2015-09-13 DIAGNOSIS — G8929 Other chronic pain: Secondary | ICD-10-CM | POA: Insufficient documentation

## 2015-09-13 DIAGNOSIS — Z3A26 26 weeks gestation of pregnancy: Secondary | ICD-10-CM | POA: Diagnosis not present

## 2015-09-13 DIAGNOSIS — O99612 Diseases of the digestive system complicating pregnancy, second trimester: Secondary | ICD-10-CM | POA: Insufficient documentation

## 2015-09-13 DIAGNOSIS — O99512 Diseases of the respiratory system complicating pregnancy, second trimester: Secondary | ICD-10-CM | POA: Diagnosis not present

## 2015-09-13 DIAGNOSIS — O9989 Other specified diseases and conditions complicating pregnancy, childbirth and the puerperium: Secondary | ICD-10-CM | POA: Insufficient documentation

## 2015-09-13 DIAGNOSIS — R079 Chest pain, unspecified: Secondary | ICD-10-CM | POA: Diagnosis not present

## 2015-09-13 DIAGNOSIS — O99352 Diseases of the nervous system complicating pregnancy, second trimester: Secondary | ICD-10-CM | POA: Insufficient documentation

## 2015-09-13 DIAGNOSIS — K219 Gastro-esophageal reflux disease without esophagitis: Secondary | ICD-10-CM | POA: Insufficient documentation

## 2015-09-13 DIAGNOSIS — O212 Late vomiting of pregnancy: Secondary | ICD-10-CM | POA: Diagnosis not present

## 2015-09-13 DIAGNOSIS — H9203 Otalgia, bilateral: Secondary | ICD-10-CM | POA: Diagnosis not present

## 2015-09-13 DIAGNOSIS — J40 Bronchitis, not specified as acute or chronic: Secondary | ICD-10-CM | POA: Diagnosis not present

## 2015-09-13 DIAGNOSIS — Z79899 Other long term (current) drug therapy: Secondary | ICD-10-CM | POA: Diagnosis not present

## 2015-09-13 LAB — BASIC METABOLIC PANEL
Anion gap: 11 (ref 5–15)
CHLORIDE: 105 mmol/L (ref 101–111)
CO2: 22 mmol/L (ref 22–32)
CREATININE: 0.5 mg/dL (ref 0.44–1.00)
Calcium: 9.3 mg/dL (ref 8.9–10.3)
GLUCOSE: 90 mg/dL (ref 65–99)
Potassium: 3.9 mmol/L (ref 3.5–5.1)
Sodium: 138 mmol/L (ref 135–145)

## 2015-09-13 LAB — CBC WITH DIFFERENTIAL/PLATELET
BASOS ABS: 0 10*3/uL (ref 0.0–0.1)
BASOS PCT: 0 %
EOS PCT: 6 %
Eosinophils Absolute: 0.6 10*3/uL (ref 0.0–0.7)
HEMATOCRIT: 29.3 % — AB (ref 36.0–46.0)
Hemoglobin: 10.8 g/dL — ABNORMAL LOW (ref 12.0–15.0)
LYMPHS PCT: 23 %
Lymphs Abs: 2.3 10*3/uL (ref 0.7–4.0)
MCH: 28.5 pg (ref 26.0–34.0)
MCHC: 36.9 g/dL — ABNORMAL HIGH (ref 30.0–36.0)
MCV: 77.3 fL — AB (ref 78.0–100.0)
Monocytes Absolute: 0.7 10*3/uL (ref 0.1–1.0)
Monocytes Relative: 7 %
NEUTROS ABS: 6.5 10*3/uL (ref 1.7–7.7)
Neutrophils Relative %: 65 %
PLATELETS: 263 10*3/uL (ref 150–400)
RBC: 3.79 MIL/uL — AB (ref 3.87–5.11)
RDW: 13.7 % (ref 11.5–15.5)
WBC: 10.1 10*3/uL (ref 4.0–10.5)

## 2015-09-13 MED ORDER — ACETAMINOPHEN 500 MG PO TABS
1000.0000 mg | ORAL_TABLET | Freq: Once | ORAL | Status: AC
  Administered 2015-09-13: 1000 mg via ORAL
  Filled 2015-09-13: qty 2

## 2015-09-13 MED ORDER — ALBUTEROL SULFATE HFA 108 (90 BASE) MCG/ACT IN AERS
2.0000 | INHALATION_SPRAY | Freq: Once | RESPIRATORY_TRACT | Status: AC
  Administered 2015-09-13: 2 via RESPIRATORY_TRACT
  Filled 2015-09-13: qty 6.7

## 2015-09-13 MED ORDER — AEROCHAMBER PLUS W/MASK MISC
1.0000 | Freq: Once | Status: AC
  Administered 2015-09-13: 1
  Filled 2015-09-13: qty 1

## 2015-09-13 NOTE — ED Notes (Signed)
PT ambulates to restroom at this time

## 2015-09-13 NOTE — ED Notes (Signed)
Patient here with sore throat and cough and congestion x 2 days

## 2015-09-13 NOTE — ED Notes (Signed)
Returns from X-ray °

## 2015-09-13 NOTE — ED Provider Notes (Signed)
CSN: [id number]     Arrival date & time 09/13/15  0931 History   First MD Initiated Contact with Patient 09/13/15 [phone]     No chief complaint on file.    (Consider location/radiation/quality/duration/timing/severity/associated sxs/prior Treatment) The history is provided by the patient.     [id number] (?), [redacted] weeks pregnant presents with sore throat, nasal congestion, rhinorrhea, productive cough, posttussive emesis x 1 week.  The symptoms have been progressive, beginning with sore throat and nasal congestion, now with cough and ear pain x 2 days.  Has noticed wheezing at night.  She has also had some burning pain in her central chest radiating down to her epigastric area that has been constant x 4 days and worse at night.  Takes both zantac and prilosec for GERD.  Has taken OTC cough and cold medications and tylenol for pain.   Denies fever, SOB, abdominal pain, diarrhea, dysuria.  Denies leg swelling, recent travel, known sick contacts.  She is originally from [place], has been in [place] 20 years.   Is doing well with the pregnancy though she does have GERD and had increased N/V in first trimester.  She is feeling regular fetal movement, denies any pain, denies any discharge, fluid, or bleeding from the vagina.     Past Medical History  Diagnosis Date  . Sickle cell trait (HCC)   . Low back pain   . Chronic headaches   . Hyperemesis    History reviewed. No pertinent past surgical history. Family History  Problem Relation Age of Onset  . Family history unknown: Yes   Social History  Substance Use Topics  . Smoking status: Never Smoker   . Smokeless tobacco: Never Used  . Alcohol Use: No   OB History    Gravida Para Term Preterm AB TAB SAB Ectopic Multiple Living   Review of Systems  All other systems reviewed and are negative.     Allergies  Review of patient's allergies indicates no known allergies.  Home Medications   Prior to Admission medications    Medication Sig Start Date End Date Taking? Authorizing Provider  acetaminophen (TYLENOL) 500 MG tablet Take 1,000 mg by mouth every 6 (six) hours as needed for mild pain. Reported on 08/20/2015    Historical Provider, MD  omeprazole (PRILOSEC) 40 MG capsule Take 1 capsule (40 mg total) by mouth daily. 08/20/15   Reva Bores, MD  Prenatal Vit-Fe Fumarate-FA (PRENATAL COMPLETE) 14-0.4 MG TABS Take 1 tablet by mouth daily. 05/20/15   Mady Gemma, PA-C  ranitidine (ZANTAC) 150 MG capsule Take 1 capsule (150 mg total) by mouth 2 (two) times daily. 08/20/15   Reva Bores, MD   BP 116/77 mmHg  Pulse 119  Temp(Src) 97.9 F (36.6 C)  Resp 20  Ht  (1.626 m)  Wt 92.534 kg  BMI 35.00 kg/m2  SpO2 98%  LMP 03/24/2015 Physical Exam  Constitutional: She appears well-developed and well-nourished. No distress.  HENT:  Head: Normocephalic and atraumatic.  Right Ear: Ear canal normal. Tympanic membrane is scarred. Tympanic membrane is not injected, not erythematous and not bulging.  Left Ear: Ear canal normal. Tympanic membrane is scarred. Tympanic membrane is not injected, not erythematous and not bulging.  Nose: Mucosal edema and rhinorrhea present.  Mouth/Throat: Oropharynx is clear and moist. No oropharyngeal exudate.  Eyes: Conjunctivae are normal.  Neck: Normal range of motion. Neck supple.  Cardiovascular: Normal rate and regular rhythm.   Pulmonary/Chest: Effort normal and breath sounds normal. No respiratory distress. She has no wheezes. She has no rales.  Musculoskeletal: She exhibits no edema.  Neurological: She is alert.  Skin: She is not diaphoretic.  Psychiatric: She has a normal mood and affect. Her behavior is normal.  Nursing note and vitals reviewed.   ED Course  Procedures (including critical care time) Labs Review Labs Reviewed  BASIC METABOLIC PANEL - Abnormal; Notable for the following:    BUN <5 (*)    All other components within normal limits  CBC WITH  DIFFERENTIAL/PLATELET - Abnormal; Notable for the following:    RBC 3.79 (*)    Hemoglobin 10.8 (*)    HCT 29.3 (*)    MCV 77.3 (*)    MCHC 36.9 (*)    All other components within normal limits    Imaging Review Dg Chest 2 View  09/13/2015  CLINICAL DATA:  Cough, at times coughing until vomiting, mid chest pain, in the morning and at night her sputum is thick for 1 week, periodic night sweats, nausea, sore throat, 6 months pregnant, history sickle cell trait EXAM: CHEST  2 VIEW COMPARISON:  04/08/2015 FINDINGS: Enlargement of cardiac silhouette with slight vascular congestion consistent with pregnant state. Mediastinal contours normal. Lungs clear. No pleural effusion or pneumothorax. Bones unremarkable. IMPRESSION: No acute abnormalities. Electronically Signed   By: Ulyses Southward M.D.   On: 09/13/2015 12:23   I have personally reviewed and evaluated these images and lab results as part of my medical decision-making.   EKG Interpretation None       Fetal heart rate is 153.  MDM   Final diagnoses:  Bronchitis   Afebrile, nontoxic patient with constellation of symptoms suggestive of viral syndrome.  Clinically doubt PE.  No concerning findings on exam.  Pt is approximately [redacted] weeks pregnant with no red flags regarding pregnancy.  Has normal OB follow up appointment in 6 days.  Labs reassuring, CXR negative. Not hypoxic. Tachycardia improved with tylenol.  Pt discussed with and also seen by Dr Patria Mane.  Discharged home with supportive care, PCP follow up.  Discussed result, findings, treatment, and follow up  with patient.  Pt given return precautions.  Pt verbalizes understanding and agrees with plan.       Trixie Dredge, PA-C 09/13/15 1449  Azalia Bilis, MD 09/13/15 [phone]

## 2015-09-13 NOTE — Discharge Instructions (Signed)
Read the information below.  You may return to the Emergency Department at any time for worsening condition or any new symptoms that concern you.  If you develop high fevers that do not resolve with tylenol, you have difficulty swallowing or breathing, or you are unable to tolerate fluids by mouth, return to the ER for a recheck.     If you develop abdominal pain, fluid or blood from the vagina, decreased fetal movement, call your OB and go directly to [facility].

## 2015-09-19 ENCOUNTER — Encounter: Payer: Medicaid Other | Admitting: Advanced Practice Midwife

## 2015-09-26 ENCOUNTER — Ambulatory Visit (INDEPENDENT_AMBULATORY_CARE_PROVIDER_SITE_OTHER): Payer: Medicaid Other | Admitting: Advanced Practice Midwife

## 2015-09-26 VITALS — BP 115/68 | HR 90 | Temp 98.0°F | Wt 204.5 lb

## 2015-09-26 DIAGNOSIS — J0110 Acute frontal sinusitis, unspecified: Secondary | ICD-10-CM

## 2015-09-26 DIAGNOSIS — O219 Vomiting of pregnancy, unspecified: Secondary | ICD-10-CM

## 2015-09-26 DIAGNOSIS — O210 Mild hyperemesis gravidarum: Secondary | ICD-10-CM | POA: Diagnosis not present

## 2015-09-26 DIAGNOSIS — J069 Acute upper respiratory infection, unspecified: Secondary | ICD-10-CM

## 2015-09-26 DIAGNOSIS — Z3482 Encounter for supervision of other normal pregnancy, second trimester: Secondary | ICD-10-CM

## 2015-09-26 DIAGNOSIS — O26892 Other specified pregnancy related conditions, second trimester: Secondary | ICD-10-CM

## 2015-09-26 DIAGNOSIS — O0992 Supervision of high risk pregnancy, unspecified, second trimester: Secondary | ICD-10-CM

## 2015-09-26 DIAGNOSIS — R102 Pelvic and perineal pain: Secondary | ICD-10-CM

## 2015-09-26 LAB — POCT URINALYSIS DIP (DEVICE)
Bilirubin Urine: NEGATIVE
Glucose, UA: NEGATIVE mg/dL
Hgb urine dipstick: NEGATIVE
Ketones, ur: NEGATIVE mg/dL
Leukocytes, UA: NEGATIVE
NITRITE: NEGATIVE
PH: 6.5 (ref 5.0–8.0)
PROTEIN: NEGATIVE mg/dL
Specific Gravity, Urine: 1.015 (ref 1.005–1.030)
UROBILINOGEN UA: 1 mg/dL (ref 0.0–1.0)

## 2015-09-26 LAB — CBC
HCT: 33.1 % — ABNORMAL LOW (ref 36.0–46.0)
Hemoglobin: 11.4 g/dL — ABNORMAL LOW (ref 12.0–15.0)
MCH: 27.7 pg (ref 26.0–34.0)
MCHC: 34.4 g/dL (ref 30.0–36.0)
MCV: 80.5 fL (ref 78.0–100.0)
MPV: 10.1 fL (ref 8.6–12.4)
PLATELETS: 310 10*3/uL (ref 150–400)
RBC: 4.11 MIL/uL (ref 3.87–5.11)
RDW: 14.2 % (ref 11.5–15.5)
WBC: 7.5 10*3/uL (ref 4.0–10.5)

## 2015-09-26 MED ORDER — BENZONATATE 100 MG PO CAPS: 200.0000 mg | ORAL_CAPSULE | Freq: Three times a day (TID) | ORAL | Status: DC | PRN

## 2015-09-26 MED ORDER — PRENATAL COMPLETE 14-0.4 MG PO TABS: 1.0000 | ORAL_TABLET | Freq: Every day | ORAL | Status: DC

## 2015-09-26 MED ORDER — COMFORT FIT MATERNITY SUPP LG MISC: 1.0000 | Freq: Every day | Status: DC

## 2015-09-26 MED ORDER — CYCLOBENZAPRINE HCL 10 MG PO TABS: 10.0000 mg | ORAL_TABLET | Freq: Three times a day (TID) | ORAL | Status: DC | PRN

## 2015-09-26 MED ORDER — TETANUS-DIPHTH-ACELL PERTUSSIS 5-2.5-18.5 LF-MCG/0.5 IM SUSP: 0.5000 mL | Freq: Once | INTRAMUSCULAR | Status: DC

## 2015-09-26 MED ORDER — AZITHROMYCIN 250 MG PO TABS: ORAL_TABLET | ORAL | Status: DC

## 2015-09-26 NOTE — Progress Notes (Signed)
States went to ER for bad cough- they gave her an inhaler. States she is still coughing up clear mucous, causing nausea and vomiting when she coughs a lot and stuffy nose for 3 days.  Also c/o sharp pain starts in right hip and goes down leg and makes it hard to walk. Declines tdap today due to illness. Completed Medicaid home form.

## 2015-09-26 NOTE — Patient Instructions (Addendum)
Upper Respiratory Infection, Adult Most upper respiratory infections (URIs) are a viral infection of the air passages leading to the lungs. A URI affects the nose, throat, and upper air passages. The most common type of URI is nasopharyngitis and is typically referred to as "the common cold." URIs run their course and usually go away on their own. Most of the time, a URI does not require medical attention, but sometimes a bacterial infection in the upper airways can follow a viral infection. This is called a secondary infection. Sinus and middle ear infections are common types of secondary upper respiratory infections. Bacterial pneumonia can also complicate a URI. A URI can worsen asthma and chronic obstructive pulmonary disease (COPD). Sometimes, these complications can require emergency medical care and may be life threatening.  CAUSES Almost all URIs are caused by viruses. A virus is a type of germ and can spread from one person to another.  RISKS FACTORS You may be at risk for a URI if:   You smoke.   You have chronic heart or lung disease.  You have a weakened defense (immune) system.   You are very young or very old.   You have nasal allergies or asthma.  You work in crowded or poorly ventilated areas.  You work in health care facilities or schools. SIGNS AND SYMPTOMS  Symptoms typically develop 2-3 days after you come in contact with a cold virus. Most viral URIs last 7-10 days. However, viral URIs from the influenza virus (flu virus) can last 14-18 days and are typically more severe. Symptoms may include:   Runny or stuffy (congested) nose.   Sneezing.   Cough.   Sore throat.   Headache.   Fatigue.   Fever.   Loss of appetite.   Pain in your forehead, behind your eyes, and over your cheekbones (sinus pain).  Muscle aches.  DIAGNOSIS  Your health care provider may diagnose a URI by:  Physical exam.  Tests to check that your symptoms are not due to  another condition such as:  Strep throat.  Sinusitis.  Pneumonia.  Asthma. TREATMENT  A URI goes away on its own with time. It cannot be cured with medicines, but medicines may be prescribed or recommended to relieve symptoms. Medicines may help:  Reduce your fever.  Reduce your cough.  Relieve nasal congestion. HOME CARE INSTRUCTIONS   Take medicines only as directed by your health care provider.   Gargle warm saltwater or take cough drops to comfort your throat as directed by your health care provider.  Use a warm mist humidifier or inhale steam from a shower to increase air moisture. This may make it easier to breathe.  Drink enough fluid to keep your urine clear or pale yellow.   Eat soups and other clear broths and maintain good nutrition.   Rest as needed.   Return to work when your temperature has returned to normal or as your health care provider advises. You may need to stay home longer to avoid infecting others. You can also use a face mask and careful hand washing to prevent spread of the virus.  Increase the usage of your inhaler if you have asthma.   Do not use any tobacco products, including cigarettes, chewing tobacco, or electronic cigarettes. If you need help quitting, ask your health care provider. PREVENTION  The best way to protect yourself from getting a cold is to practice good hygiene.   Avoid oral or hand contact with people with cold   symptoms.   Wash your hands often if contact occurs.  There is no clear evidence that vitamin C, vitamin E, echinacea, or exercise reduces the chance of developing a cold. However, it is always recommended to get plenty of rest, exercise, and practice good nutrition.  SEEK MEDICAL CARE IF:   You are getting worse rather than better.   Your symptoms are not controlled by medicine.   You have chills.  You have worsening shortness of breath.  You have brown or red mucus.  You have yellow or brown nasal  discharge.  You have pain in your face, especially when you bend forward.  You have a fever.  You have swollen neck glands.  You have pain while swallowing.  You have white areas in the back of your throat. SEEK IMMEDIATE MEDICAL CARE IF:   You have severe or persistent:  Headache.  Ear pain.  Sinus pain.  Chest pain.  You have chronic lung disease and any of the following:  Wheezing.  Prolonged cough.  Coughing up blood.  A change in your usual mucus.  You have a stiff neck.  You have changes in your:  Vision.  Hearing.  Thinking.  Mood. MAKE SURE YOU:   Understand these instructions.  Will watch your condition.  Will get help right away if you are not doing well or get worse.   This information is not intended to replace advice given to you by your health care provider. Make sure you discuss any questions you have with your health care provider.   Document Released: 12/24/2000 Document Revised: 11/14/2014 Document Reviewed: 10/05/2013 Elsevier Interactive Patient Education 2016 Elsevier Inc.    Pelvic pain (SPD) Approved by the [facility] [place] Medical Advisory Board Last reviewed: May 2014  Show references Hide references  Share  In this article What is symphysis pubis dysfunction?  What are the symptoms of SPD?  What causes SPD?  When does SPD happen?  How is SPD diagnosed?  How is SPD treated?  How can I help to ease my pain?  Will I recover from SPD after I've had my baby ? What is symphysis pubis dysfunction?The symphysis pubis is a stiff joint that connects the two halves of your pelvis. This joint is strengthened by a dense network of tough, flexible tissues (ligaments). Your body produces a hormone called relaxin, which softens your ligaments in order to help your baby pass through your pelvis.   Your pelvic joints move more during and just after pregnancy (Bjorkland et al [address] Bjorkland et al 2000, [address] 1997,  [place] et al 2012). This can cause inflammation and pain, and may lead to the condition symphysis pubis dysfunction (SPD).   Diastasis symphysis pubis (DSP) is similar to SPD. This is when the gap in the pubic joint widens too far. The average gap between the bones in a non-pregnant woman is between 4mm and 5mm. During pregnancy it's normal for this gap to widen by 2mm or 3mm. If the gap is 10mm or more, DSP is diagnosed. It's rare, and can only be identified by X-ray Jimmey Ralph & Dalbert Mayotte 2009).  What are the symptoms of SPD?Pain in the pubic area and groin are the most common symptoms, though you may also have the following signs:  Back pain, pelvic girdle pain or hip pain.  A grinding or clicking sensation in your pubic area.  Pain down the inside of your thighs or between your legs. It can be made worse by parting your legs,  walking, going up or down stairs or moving around in bed.  Worse pain at night. SPD can prevent you from sleeping well. Getting up to go to the toilet in the middle of the night can be especially painful. What causes SPD?SPD is thought to be caused by a combination of hormones that you produce during pregnancy, as well as the way your body moves. If one side of your pelvis moves more than the other when you walk or move around, the area around the symphysis pubis becomes tender [facility] et al 1999, Damen et al 2001, Vleeming et al 2005).   The size of the gap in your joint doesn't bear any relation to the amount of pain you may feel. Many women with a normal-sized gap feel a lot of pain.   You may be more likely to develop SPD if you started your periods before you were 11, or are overweight Chestine Spore C 2010, Dennison et al 2009, Bjelland et al 2011). When does SPD happen?SPD can occur at any time during your pregnancy or after giving birth Rober Minion et al 2011). You may notice it for the first time during the middle of your pregnancy.   If you have SPD in one pregnancy, it is  more likely that you'll have it next time you get pregnant (Bjelland et al 2010, Shephered 1997, Jamelle Haring 1997). The symptoms may also come on earlier and progress faster, so it is important to seek help promptly. It can help if you allow the symptoms from one pregnancy to settle before trying to get pregnant again. How is SPD diagnosed?SPD is becoming more widely understood by doctors, physiotherapists and midwives. Your doctor or midwife should refer you to a women's health physiotherapist.   Your physiotherapist will test the stability, movement and pain in your pelvic joints and muscles Lanny Cramp al 2006, Kristiansson and Svardsudd 1996, V[place] et al 2002, [address] et al 2005). How is SPD treated?SPD is often managed in the same way as pelvic girdle pain. Treatment includes:  Exercises, especially focused on your tummy and pelvic floor muscles. These will improve the stability of your pelvis and back Senaida Ores et al 2002, Zenaida Niece Wingerden et al 2004, [place] et al 2005). You may need gentle, hands-on treatment of your hip, back or pelvis to correct stiffness or imbalance. Exercise in water can sometimes help.  You should also be given advice on how to make daily activities less painful and on how to make the birth of your baby easier. Your midwife should help you to write a birth plan which takes into account your SPD symptoms.  Acupuncture may help and is safe during pregnancy. Make sure your practitioner is trained and experienced in working with pregnant women Johnsie Cancel 2007, Matthews et al 2007, Elden et al 2008, Kvorning et al 2004, [address] et al 2006, Tamsen Snider et al 2001).  Osteopathy and chiropractic treatment may help, but again, see a registered practitioner who is experienced in treating pregnant women (Licciardone et al 2010).  A pelvic support belt will give quick relief (Mens et al 2006, Ostgaard et al 1994, [place] 1610). How can I help to ease my pain?  Do pelvic floor and tummy exercises. Get  down onto your hands and knees and level your back so that it is roughly flat. Breathe in and then as you breathe out, squeeze in your pelvic floor muscles and pull your belly button in and up. Hold this contraction for between five and 10 seconds, breathing  through it. Relax your muscles slowly at the end of the exercise.  Try not to move your legs apart when your back is slumped or when you are lying down. Take care when getting in and out of the car, bed or bath. If you are lying down, pull your knees up as far as you can to stop your pelvis from moving and make it easier to part your legs. If you are sitting, try arching your back and sticking your chest out before parting or moving your legs.  Don't push through your pain. If something hurts, stop doing it. If the pain is allowed to flare up, it can take a long time to settle down again.  Move little and often. You may not feel the effects of what you are doing until later in the day or after you have gone to bed.  Rest regularly by sitting on a birth ball or by getting down on your hands and knees. This takes the weight of your baby off your pelvis and holds it in a stable position.  Try not to do heavy lifting or pushing. Supermarket trolleys can often make your pain worse, so shop online or ask someone to shop for you.  When climbing stairs, take one step at a time. Step up onto one step with your best leg and then bring your other leg to meet it. Repeat with each step.  Avoid swimming breaststroke and take care with other strokes. You may feel swimming is helping your pain while you are in the water, but it could make you feel worse when you get out.  When getting dressed, sit down to pull on your knickers or trousers. Will I recover from SPD after I've had my baby ?You're very likely to recover after your baby is born Georga Kaufmann(Owens et al 2002). If you can, carry on with physiotherapy after the birth. Try to get help with looking after your baby during  the early weeks.   You may find you get twinges every month just before your period is due. This is caused by hormones that have a similar effect to the pregnancy hormone relaxin.

## 2015-09-26 NOTE — Progress Notes (Signed)
Subjective:  Anna Walls is a 35 y.o. G5P4003 at [redacted]w[redacted]d being seen today for ongoing prenatal care.  She is currently monitored for the following issues for this low-risk pregnancy and has Supervision of high risk pregnancy, antepartum and Hyperemesis affecting pregnancy, antepartum on her problem list.  Patient reports cough, congestion, sinus pressure, ear pain, sore throat, pain in right hip/pelvis radiating down right leg.  Contractions: Irregular. Vag. Bleeding: None.  Movement: Present. Denies leaking of fluid.   The following portions of the patient's history were reviewed and updated as appropriate: allergies, current medications, past family history, past medical history, past social history, past surgical history and problem list. Problem list updated.  Objective:   Filed Vitals:   09/26/15 0946  BP: 115/68  Pulse: 90  Temp: 98 F (36.7 C)  Weight: 204 lb 8 oz (92.761 kg)    Fetal Status: Fetal Heart Rate (bpm): 142 Fundal Height: 26 cm Movement: Present     General:  Alert, oriented and cooperative. Patient is in no acute distress.  Skin: Skin is warm and dry. No rash noted.   Cardiovascular: Normal heart rate noted  Respiratory: Normal respiratory effort, no problems with respiration noted  Abdomen: Soft, gravid, appropriate for gestational age. Pain/Pressure: Present     Pelvic: Vag. Bleeding: None     Cervical exam deferred        Extremities: Normal range of motion.  Edema: Trace  Mental Status: Normal mood and affect. Normal behavior. Normal judgment and thought content.   Urinalysis: Urine Protein: Negative Urine Glucose: Negative  Assessment and Plan:  Pregnancy: G5P4003 at [redacted]w[redacted]d  1. Supervision of high risk pregnancy, antepartum, second trimester  - Glucose Tolerance, 1 HR (50g) w/o Fasting - CBC - RPR - HIV antibody (with reflex) - Tdap (BOOSTRIX) injection 0.5 mL; Inject 0.5 mLs into the muscle once. - POCT urinalysis dip (device) - albuterol  (PROVENTIL HFA;VENTOLIN HFA) 108 (90 Base) MCG/ACT inhaler; Inhale 1-2 puffs into the lungs every 6 (six) hours as needed for wheezing or shortness of breath. - OVER THE COUNTER MEDICATION; 2 capsules every 4 (four) hours as needed. - Prenatal Vit-Fe Fumarate-FA (PRENATAL COMPLETE) 14-0.4 MG TABS; Take 1 tablet by mouth daily.  Dispense: 30 each; Refill: 6  2. URI (upper respiratory infection) --Symptoms of URI x 4 weeks, seen in ED on 09/13/15.  Worsening symptoms with increase in sinus and ear pressure/pain.  Will treat for sinusitis with abx. - azithromycin (ZITHROMAX) 250 MG tablet; Take 2 tabs on Day 1, then 1 tab daily for 4 days  Dispense: 6 tablet; Refill: 0 - benzonatate (TESSALON PERLES) 100 MG capsule; Take 2 capsules (200 mg total) by mouth 3 (three) times daily as needed for cough.  Dispense: 20 capsule; Refill: 0 --Increase PO fluids, reviewed safe OTC medications in pregnancy.  3. Acute frontal sinusitis, recurrence not specified - azithromycin (ZITHROMAX) 250 MG tablet; Take 2 tabs on Day 1, then 1 tab daily for 4 days  Dispense: 6 tablet; Refill: 0  4. Pelvic pain affecting pregnancy in second trimester, antepartum  - cyclobenzaprine (FLEXERIL) 10 MG tablet; Take 1 tablet (10 mg total) by mouth 3 (three) times daily as needed for muscle spasms.  Dispense: 30 tablet; Refill: 0 - Elastic Bandages & Supports (COMFORT FIT MATERNITY SUPP LG) MISC; 1 Device by Does not apply route daily.  Dispense: 1 each; Refill: 0  Preterm labor symptoms and general obstetric precautions including but not limited to vaginal bleeding, contractions, leaking of fluid  and fetal movement were reviewed in detail with the patient. Please refer to After Visit Summary for other counseling recommendations.  Return in about 2 weeks (around 10/10/2015).   Hurshel PartyLisa A Leftwich-Kirby, CNM

## 2015-09-27 LAB — RPR

## 2015-09-27 LAB — GLUCOSE TOLERANCE, 1 HOUR (50G) W/O FASTING: GLUCOSE, 1 HR, GESTATIONAL: 114 mg/dL (ref <140)

## 2015-09-27 LAB — HIV ANTIBODY (ROUTINE TESTING W REFLEX): HIV: NONREACTIVE

## 2015-10-16 ENCOUNTER — Ambulatory Visit (INDEPENDENT_AMBULATORY_CARE_PROVIDER_SITE_OTHER): Payer: Medicaid Other | Admitting: Family

## 2015-10-16 VITALS — BP 109/69 | HR 113 | Wt 205.3 lb

## 2015-10-16 DIAGNOSIS — M549 Dorsalgia, unspecified: Secondary | ICD-10-CM

## 2015-10-16 DIAGNOSIS — O0993 Supervision of high risk pregnancy, unspecified, third trimester: Secondary | ICD-10-CM

## 2015-10-16 DIAGNOSIS — O26893 Other specified pregnancy related conditions, third trimester: Secondary | ICD-10-CM

## 2015-10-16 LAB — POCT URINALYSIS DIP (DEVICE)
BILIRUBIN URINE: NEGATIVE
GLUCOSE, UA: NEGATIVE mg/dL
HGB URINE DIPSTICK: NEGATIVE
Ketones, ur: NEGATIVE mg/dL
NITRITE: NEGATIVE
Protein, ur: 30 mg/dL — AB
Specific Gravity, Urine: 1.015 (ref 1.005–1.030)
UROBILINOGEN UA: 0.2 mg/dL (ref 0.0–1.0)
pH: 8.5 — ABNORMAL HIGH (ref 5.0–8.0)

## 2015-10-16 NOTE — Progress Notes (Signed)
Pt requesting written rx for pregnancy support belt.

## 2015-10-16 NOTE — Progress Notes (Signed)
Subjective:  Anna Walls is a 35 y.o. G5P4003 at 567w3d being seen today for ongoing prenatal care.  She is currently monitored for the following issues for this high-risk pregnancy and has Supervision of high risk pregnancy, antepartum; Hyperemesis affecting pregnancy, antepartum; and Pregnancy related back pain in third trimester, antepartum on her problem list.  Patient reports backache, heartburn, nausea and vomiting. Backache goes into right hip and worse after walking. Heartburn is daily, takes omeprazole and ranitidine which helps. Vomiting once a day. Contractions: Irritability. Vag. Bleeding: None.  Movement: Present. Denies leaking of fluid.   The following portions of the patient's history were reviewed and updated as appropriate: allergies, current medications, past family history, past medical history, past social history, past surgical history and problem list. Problem list updated.  Objective:   Filed Vitals:   10/16/15 0944  BP: 109/69  Pulse: 113  Weight: 205 lb 4.8 oz (93.123 kg)    Fetal Status: Fetal Heart Rate (bpm): 146 Fundal Height: 30 cm Movement: Present     General:  Alert, oriented and cooperative. Patient is in no acute distress.  Skin: Skin is warm and dry. No rash noted.   Cardiovascular: Normal heart rate noted  Respiratory: Normal respiratory effort, no problems with respiration noted  Abdomen: Soft, gravid, appropriate for gestational age. Pain/Pressure: Present     Pelvic: Vag. Bleeding: None     Cervical exam deferred        Extremities: Normal range of motion.  Edema: Trace  Mental Status: Normal mood and affect. Normal behavior. Normal judgment and thought content.   Urinalysis: Urine Protein: 1+ Urine Glucose: Negative  Assessment and Plan:  Pregnancy: G5P4003 at 3267w3d  1. Supervision of high risk pregnancy, antepartum, third trimester - Reviewed third trimester labs with patient.    2. Pregnancy related back pain in third trimester,  antepartum - Rx for maternity belt  Preterm labor symptoms and general obstetric precautions including but not limited to vaginal bleeding, contractions, leaking of fluid and fetal movement were reviewed in detail with the patient. Please refer to After Visit Summary for other counseling recommendations.  Return in about 2 weeks (around 10/30/2015).   Eino FarberWalidah Kennith GainN Karim, CNM

## 2015-10-16 NOTE — Patient Instructions (Signed)
  [facility] [address], [place], [place] [address] [phone]   

## 2015-10-30 ENCOUNTER — Other Ambulatory Visit: Payer: Self-pay | Admitting: Family Medicine

## 2015-10-31 ENCOUNTER — Ambulatory Visit (INDEPENDENT_AMBULATORY_CARE_PROVIDER_SITE_OTHER): Payer: Medicaid Other | Admitting: Certified Nurse Midwife

## 2015-10-31 VITALS — BP 118/63 | HR 81 | Wt 204.0 lb

## 2015-10-31 DIAGNOSIS — O0993 Supervision of high risk pregnancy, unspecified, third trimester: Secondary | ICD-10-CM

## 2015-10-31 LAB — POCT URINALYSIS DIP (DEVICE)
Bilirubin Urine: NEGATIVE
Glucose, UA: NEGATIVE mg/dL
Hgb urine dipstick: NEGATIVE
Ketones, ur: NEGATIVE mg/dL
Nitrite: NEGATIVE
Protein, ur: NEGATIVE mg/dL
Specific Gravity, Urine: 1.02 (ref 1.005–1.030)
Urobilinogen, UA: 0.2 mg/dL (ref 0.0–1.0)
pH: 7.5 (ref 5.0–8.0)

## 2015-10-31 NOTE — Patient Instructions (Signed)

## 2015-10-31 NOTE — Progress Notes (Signed)
Subjective:  Anna Walls is a 35 y.o. G5P4003 at 8768w4d being seen today for ongoing prenatal care.  She is currently monitored for the following issues for this low-risk pregnancy and has Supervision of high risk pregnancy, antepartum; Hyperemesis affecting pregnancy, antepartum; and Pregnancy related back pain in third trimester, antepartum on her problem list.  Patient reports no complaints.  Contractions: Irritability. Vag. Bleeding: Scant.  Movement: Present. Denies leaking of fluid.   The following portions of the patient's history were reviewed and updated as appropriate: allergies, current medications, past family history, past medical history, past social history, past surgical history and problem list. Problem list updated.  Objective:   Filed Vitals:   10/31/15 1111  BP: 118/63  Pulse: 81  Weight: 204 lb (92.534 kg)    Fetal Status: Fetal Heart Rate (bpm): 143   Movement: Present     General:  Alert, oriented and cooperative. Patient is in no acute distress.  Skin: Skin is warm and dry. No rash noted.   Cardiovascular: Normal heart rate noted  Respiratory: Normal respiratory effort, no problems with respiration noted  Abdomen: Soft, gravid, appropriate for gestational age. Pain/Pressure: Present     Pelvic: Vag. Bleeding: Scant     Cervical exam deferred        Extremities: Normal range of motion.  Edema: Trace  Mental Status: Normal mood and affect. Normal behavior. Normal judgment and thought content.   Urinalysis:      Assessment and Plan:  Pregnancy: G5P4003 at 11068w4d  1. Supervision of high risk pregnancy, antepartum, third trimester   Preterm labor symptoms and general obstetric precautions including but not limited to vaginal bleeding, contractions, leaking of fluid and fetal movement were reviewed in detail with the patient. Please refer to After Visit Summary for other counseling recommendations.  Return in about 2 weeks (around 11/14/2015).   Rhea PinkLori A  Clemmons, CNM

## 2015-10-31 NOTE — Progress Notes (Signed)
Patient reports contractions all day yesterday that were painful every 5-10 minutes. None so far today.

## 2015-11-14 ENCOUNTER — Encounter: Payer: Self-pay | Admitting: Student

## 2015-11-14 ENCOUNTER — Ambulatory Visit (INDEPENDENT_AMBULATORY_CARE_PROVIDER_SITE_OTHER): Payer: Medicaid Other | Admitting: Student

## 2015-11-14 VITALS — BP 101/67 | HR 100 | Wt 203.7 lb

## 2015-11-14 DIAGNOSIS — O0993 Supervision of high risk pregnancy, unspecified, third trimester: Secondary | ICD-10-CM

## 2015-11-14 DIAGNOSIS — Z3493 Encounter for supervision of normal pregnancy, unspecified, third trimester: Secondary | ICD-10-CM

## 2015-11-14 LAB — POCT URINALYSIS DIP (DEVICE)
BILIRUBIN URINE: NEGATIVE
Glucose, UA: NEGATIVE mg/dL
Hgb urine dipstick: NEGATIVE
KETONES UR: NEGATIVE mg/dL
Nitrite: NEGATIVE
PROTEIN: 30 mg/dL — AB
SPECIFIC GRAVITY, URINE: 1.015 (ref 1.005–1.030)
Urobilinogen, UA: 1 mg/dL (ref 0.0–1.0)
pH: 7 (ref 5.0–8.0)

## 2015-11-14 NOTE — Progress Notes (Signed)
Subjective:  Anna Walls is a 35 y.o. G5P4003 at 6741w4d being seen today for ongoing prenatal care.  She is currently monitored for the following issues for this low-risk pregnancy and has Supervision of high risk pregnancy, antepartum; Hyperemesis affecting pregnancy, antepartum; and Pregnancy related back pain in third trimester, antepartum on her problem list.  Patient reports occasional contractions and increase in vaginal discharge. Reports increase in contractions over the last 3 weeks. Has had 3 contractions today. Has history of term deliveries but states that she would "dilate early at 7 months". Also reports increase in watery discharge over the last 2 weeks. Leaking is not consistent. Denies vaginal bleeding.  Contractions: Irritability.  .  Movement: Present. Denies leaking of fluid.   The following portions of the patient's history were reviewed and updated as appropriate: allergies, current medications, past family history, past medical history, past social history, past surgical history and problem list. Problem list updated.  Objective:   Filed Vitals:   11/14/15 1132  BP: 101/67  Pulse: 100  Weight: 203 lb 11.2 oz (92.398 kg)    Fetal Status: Fetal Heart Rate (bpm): 147   Movement: Present     General:  Alert, oriented and cooperative. Patient is in no acute distress.  Skin: Skin is warm and dry. No rash noted.   Cardiovascular: Normal heart rate noted  Respiratory: Normal respiratory effort, no problems with respiration noted  Abdomen: Soft, gravid, appropriate for gestational age. Pain/Pressure: Present     Pelvic:      No pooling. Small amount of clear thin discharge. Fern negative.  Cervical exam performed      0/0/-3  Extremities: Normal range of motion.  Edema: Trace  Mental Status: Normal mood and affect. Normal behavior. Normal judgment and thought content.   Urinalysis:      Assessment and Plan:  Pregnancy: G5P4003 at 6441w4d  1. Supervision of high risk  pregnancy, antepartum, third trimester   Preterm labor symptoms and general obstetric precautions including but not limited to vaginal bleeding, contractions, leaking of fluid and fetal movement were reviewed in detail with the patient. Please refer to After Visit Summary for other counseling recommendations.  Return in about 2 weeks (around 11/28/2015) for Routine OB.   Judeth HornErin Garlen Reinig, NP

## 2015-11-14 NOTE — Patient Instructions (Signed)

## 2015-11-21 ENCOUNTER — Other Ambulatory Visit: Payer: Self-pay | Admitting: Family Medicine

## 2015-12-03 ENCOUNTER — Ambulatory Visit (INDEPENDENT_AMBULATORY_CARE_PROVIDER_SITE_OTHER): Payer: Medicaid Other | Admitting: Obstetrics and Gynecology

## 2015-12-03 ENCOUNTER — Other Ambulatory Visit (HOSPITAL_COMMUNITY)
Admission: RE | Admit: 2015-12-03 | Discharge: 2015-12-03 | Disposition: A | Discharge status: Home / Self Care | Payer: Medicaid Other | Source: Ambulatory Visit | Attending: Obstetrics and Gynecology | Admitting: Obstetrics and Gynecology

## 2015-12-03 ENCOUNTER — Telehealth: Payer: Self-pay | Admitting: *Deleted

## 2015-12-03 VITALS — BP 102/72 | HR 118 | Wt 202.9 lb

## 2015-12-03 DIAGNOSIS — Z3493 Encounter for supervision of normal pregnancy, unspecified, third trimester: Secondary | ICD-10-CM

## 2015-12-03 DIAGNOSIS — Z3490 Encounter for supervision of normal pregnancy, unspecified, unspecified trimester: Secondary | ICD-10-CM

## 2015-12-03 DIAGNOSIS — Z113 Encounter for screening for infections with a predominantly sexual mode of transmission: Secondary | ICD-10-CM | POA: Diagnosis not present

## 2015-12-03 LAB — OB RESULTS CONSOLE GC/CHLAMYDIA: Gonorrhea: NEGATIVE

## 2015-12-03 LAB — POCT URINALYSIS DIP (DEVICE)
Glucose, UA: NEGATIVE mg/dL
Hgb urine dipstick: NEGATIVE
KETONES UR: NEGATIVE mg/dL
Nitrite: NEGATIVE
PH: 7 (ref 5.0–8.0)
PROTEIN: 30 mg/dL — AB
SPECIFIC GRAVITY, URINE: 1.02 (ref 1.005–1.030)
Urobilinogen, UA: 1 mg/dL (ref 0.0–1.0)

## 2015-12-03 LAB — OB RESULTS CONSOLE GBS: GBS: POSITIVE

## 2015-12-03 NOTE — Progress Notes (Signed)
Patient states she has lots of pressure and that sometimes her insides come out of her vagina and it happened today. Curtures

## 2015-12-03 NOTE — Telephone Encounter (Signed)
Patient called, stated she has an appointment today at 2pm but woke up this morning with pinkish discharge as if she were about to start a period. Did not have intercourse last night. I reassured her that unless she noticed more bleeding that is period-like she does not need to worry and she should keep her appointment this afternoon. Patient voiced understanding.

## 2015-12-03 NOTE — Progress Notes (Signed)
Subjective:  Anna Walls is a 35 y.o. G5P4003 at 8764w2d being seen today for ongoing prenatal care.  She is currently monitored for the following issues for this low-risk pregnancy and has Supervision of high risk pregnancy, antepartum; Hyperemesis affecting pregnancy, antepartum; and Pregnancy related back pain in third trimester, antepartum on her problem list.  Patient reports no complaints.  Contractions: Irritability. Vag. Bleeding: None.  Movement: Present. Denies leaking of fluid.   The following portions of the patient's history were reviewed and updated as appropriate: allergies, current medications, past family history, past medical history, past social history, past surgical history and problem list. Problem list updated.  Objective:   Filed Vitals:   12/03/15 1301  BP: 102/72  Pulse: 118  Weight: 202 lb 14.4 oz (92.035 kg)    Fetal Status: Fetal Heart Rate (bpm): 150   Movement: Present     General:  Alert, oriented and cooperative. Patient is in no acute distress.  Skin: Skin is warm and dry. No rash noted.   Cardiovascular: Normal heart rate noted  Respiratory: Normal respiratory effort, no problems with respiration noted  Abdomen: Soft, gravid, appropriate for gestational age. Pain/Pressure: Present     Pelvic: Vag. Bleeding: None Vag D/C Character: Watery   Cervical exam deferred        Extremities: Normal range of motion.  Edema: Trace  Mental Status: Normal mood and affect. Normal behavior. Normal judgment and thought content.   Urinalysis:      Assessment and Plan:  Pregnancy: G5P4003 at 2464w2d  # Pregnancy - g/c/gbs collected today, nkda  Preterm labor symptoms and general obstetric precautions including but not limited to vaginal bleeding, contractions, leaking of fluid and fetal movement were reviewed in detail with the patient. Please refer to After Visit Summary for other counseling recommendations.    Kathrynn RunningNoah Bedford Neaveh Belanger, MD

## 2015-12-04 LAB — GC/CHLAMYDIA PROBE AMP (~~LOC~~) NOT AT ARMC
Chlamydia: NEGATIVE
Neisseria Gonorrhea: NEGATIVE

## 2015-12-04 LAB — CULTURE, BETA STREP (GROUP B ONLY)

## 2015-12-12 ENCOUNTER — Ambulatory Visit (INDEPENDENT_AMBULATORY_CARE_PROVIDER_SITE_OTHER): Payer: Medicaid Other | Admitting: Family

## 2015-12-12 VITALS — BP 111/71 | HR 102 | Wt 206.0 lb

## 2015-12-12 DIAGNOSIS — O0993 Supervision of high risk pregnancy, unspecified, third trimester: Secondary | ICD-10-CM

## 2015-12-12 DIAGNOSIS — O26893 Other specified pregnancy related conditions, third trimester: Secondary | ICD-10-CM

## 2015-12-12 DIAGNOSIS — M549 Dorsalgia, unspecified: Secondary | ICD-10-CM

## 2015-12-12 LAB — POCT URINALYSIS DIP (DEVICE)
Bilirubin Urine: NEGATIVE
GLUCOSE, UA: NEGATIVE mg/dL
HGB URINE DIPSTICK: NEGATIVE
Ketones, ur: NEGATIVE mg/dL
Nitrite: NEGATIVE
PROTEIN: NEGATIVE mg/dL
SPECIFIC GRAVITY, URINE: 1.025 (ref 1.005–1.030)
UROBILINOGEN UA: 0.2 mg/dL (ref 0.0–1.0)
pH: 6 (ref 5.0–8.0)

## 2015-12-12 NOTE — Patient Instructions (Signed)

## 2015-12-12 NOTE — Progress Notes (Signed)
Possible early labor Subjective:  Anna Walls is a 35 y.o. G5P4003 at 419w4d being seen today for ongoing prenatal care.  She is currently monitored for the following issues for this high-risk pregnancy and has Supervision of high risk pregnancy, antepartum; Hyperemesis affecting pregnancy, antepartum; and Pregnancy related back pain in third trimester, antepartum on her problem list.  Patient reports contractions since 2 weeks.  Contractions: Irritability. Vag. Bleeding: None.  Movement: Present. Denies leaking of fluid.   The following portions of the patient's history were reviewed and updated as appropriate: allergies, current medications, past family history, past medical history, past social history, past surgical history and problem list. Problem list updated.  Objective:   Filed Vitals:   12/12/15 1111  BP: 111/71  Pulse: 102  Weight: 206 lb (93.441 kg)    Fetal Status: Fetal Heart Rate (bpm): 175   Movement: Present     General:  Alert, oriented and cooperative. Patient is in no acute distress.  Skin: Skin is warm and dry. No rash noted.   Cardiovascular: Normal heart rate noted  Respiratory: Normal respiratory effort, no problems with respiration noted  Abdomen: Soft, gravid, appropriate for gestational age. Pain/Pressure: Present     Pelvic: Vag. Bleeding: None     Cervical exam performed        Extremities: Normal range of motion.     Mental Status: Normal mood and affect. Normal behavior. Normal judgment and thought content.   Urinalysis: Urine Protein: Negative Urine Glucose: Negative  Assessment and Plan:  Pregnancy: G5P4003 at 4119w4d  1. Pregnancy related back pain in third trimester, antepartum   2. Supervision of high risk pregnancy, antepartum, third trimester   Term labor symptoms and general obstetric precautions including but not limited to vaginal bleeding, contractions, leaking of fluid and fetal movement were reviewed in detail with the  patient. Please refer to After Visit Summary for other counseling recommendations.  Return in about 1 week (around 12/19/2015).   Rhea PinkLori A Shiva Sahagian, CNM

## 2015-12-18 ENCOUNTER — Other Ambulatory Visit: Payer: Self-pay | Admitting: Family Medicine

## 2015-12-19 ENCOUNTER — Ambulatory Visit (INDEPENDENT_AMBULATORY_CARE_PROVIDER_SITE_OTHER): Payer: Medicaid Other | Admitting: Obstetrics and Gynecology

## 2015-12-19 VITALS — BP 102/60 | HR 127 | Wt 203.3 lb

## 2015-12-19 DIAGNOSIS — Z3483 Encounter for supervision of other normal pregnancy, third trimester: Secondary | ICD-10-CM

## 2015-12-19 DIAGNOSIS — M549 Dorsalgia, unspecified: Secondary | ICD-10-CM | POA: Diagnosis not present

## 2015-12-19 DIAGNOSIS — R12 Heartburn: Secondary | ICD-10-CM | POA: Diagnosis not present

## 2015-12-19 DIAGNOSIS — O26893 Other specified pregnancy related conditions, third trimester: Secondary | ICD-10-CM

## 2015-12-19 DIAGNOSIS — O0993 Supervision of high risk pregnancy, unspecified, third trimester: Secondary | ICD-10-CM

## 2015-12-19 LAB — POCT URINALYSIS DIP (DEVICE)
Bilirubin Urine: NEGATIVE
Glucose, UA: NEGATIVE mg/dL
HGB URINE DIPSTICK: NEGATIVE
KETONES UR: NEGATIVE mg/dL
Nitrite: NEGATIVE
PH: 7 (ref 5.0–8.0)
PROTEIN: 30 mg/dL — AB
SPECIFIC GRAVITY, URINE: 1.02 (ref 1.005–1.030)
UROBILINOGEN UA: 1 mg/dL (ref 0.0–1.0)

## 2015-12-19 MED ORDER — RANITIDINE HCL 150 MG PO CAPS: 150.0000 mg | ORAL_CAPSULE | Freq: Two times a day (BID) | ORAL | Status: DC

## 2015-12-19 NOTE — Progress Notes (Signed)
Subjective:  Anna Walls is a 35 y.o. G5P4003 at 7231w4d being seen today for ongoing prenatal care.  She is currently monitored for the following issues for this low-risk pregnancy and has Supervision of high risk pregnancy, antepartum; Hyperemesis affecting pregnancy, antepartum; and Pregnancy related back pain in third trimester, antepartum on her problem list.  Patient reports LBP and pain over SP joint with walking. Ran out of Zantac.  Contractions: Irregular. Vag. Bleeding: None.  Movement: Present. Denies leaking of fluid.   The following portions of the patient's history were reviewed and updated as appropriate: allergies, current medications, past family history, past medical history, past social history, past surgical history and problem list. Problem list updated.  Objective:   Filed Vitals:   12/19/15 1006  BP: 102/60  Pulse: 127  Weight: 203 lb 4.8 oz (92.216 kg)    Fetal Status: Fetal Heart Rate (bpm): 152   Movement: Present     General:  Alert, oriented and cooperative. Patient is in no acute distress.  Skin: Skin is warm and dry. No rash noted.   Cardiovascular: Normal heart rate noted  Respiratory: Normal respiratory effort, no problems with respiration noted  Abdomen: Soft, gravid, appropriate for gestational age. Pain/Pressure: Present     Pelvic: Vag. Bleeding: None     Cervical exam deferred         Extremities: Normal range of motion.     Mental Status: Normal mood and affect. Normal behavior. Normal judgment and thought content.   Urinalysis:      Assessment and Plan:  Pregnancy: G5P4003 at 3731w4d  1. Supervision of low risk pregnancy, antepartum, third trimester Doing well  2. Pregnancy related back pain in third trimester, antepartum Exercise and stretching information given. Pelvic support girdle  Term labor symptoms and general obstetric precautions including but not limited to vaginal bleeding, contractions, leaking of fluid and fetal movement  were reviewed in detail with the patient. Please refer to After Visit Summary for other counseling recommendations.  Cx check next week requested Return in about 1 week (around 12/26/2015).   Danae Orleanseirdre C Pritesh Sobecki, CNM

## 2015-12-19 NOTE — Progress Notes (Signed)
Pt reports increased frequency of heartburn and requests refill of Zantac. She is also having intense pelvic pain and difficulty walking.

## 2015-12-19 NOTE — Patient Instructions (Signed)

## 2015-12-27 ENCOUNTER — Inpatient Hospital Stay (HOSPITAL_COMMUNITY): Payer: Medicaid Other | Admitting: Anesthesiology

## 2015-12-27 ENCOUNTER — Inpatient Hospital Stay (HOSPITAL_COMMUNITY)
Admission: AD | Admit: 2015-12-27 | Discharge: 2015-12-29 | DRG: 775 | Disposition: A | Discharge status: Home / Self Care | Payer: Medicaid Other | Source: Ambulatory Visit | Attending: Obstetrics & Gynecology | Admitting: Obstetrics & Gynecology

## 2015-12-27 ENCOUNTER — Encounter (HOSPITAL_COMMUNITY): Payer: Self-pay | Admitting: *Deleted

## 2015-12-27 DIAGNOSIS — M549 Dorsalgia, unspecified: Secondary | ICD-10-CM

## 2015-12-27 DIAGNOSIS — D573 Sickle-cell trait: Secondary | ICD-10-CM | POA: Diagnosis present

## 2015-12-27 DIAGNOSIS — O99824 Streptococcus B carrier state complicating childbirth: Secondary | ICD-10-CM | POA: Diagnosis present

## 2015-12-27 DIAGNOSIS — IMO0001 Reserved for inherently not codable concepts without codable children: Secondary | ICD-10-CM

## 2015-12-27 DIAGNOSIS — Z3A39 39 weeks gestation of pregnancy: Secondary | ICD-10-CM

## 2015-12-27 DIAGNOSIS — O26893 Other specified pregnancy related conditions, third trimester: Secondary | ICD-10-CM

## 2015-12-27 DIAGNOSIS — O0990 Supervision of high risk pregnancy, unspecified, unspecified trimester: Secondary | ICD-10-CM

## 2015-12-27 DIAGNOSIS — O210 Mild hyperemesis gravidarum: Secondary | ICD-10-CM

## 2015-12-27 LAB — CBC
HEMATOCRIT: 27.4 % — AB (ref 36.0–46.0)
HEMOGLOBIN: 10.1 g/dL — AB (ref 12.0–15.0)
MCH: 27 pg (ref 26.0–34.0)
MCHC: 36.9 g/dL — ABNORMAL HIGH (ref 30.0–36.0)
MCV: 73.3 fL — AB (ref 78.0–100.0)
Platelets: 278 10*3/uL (ref 150–400)
RBC: 3.74 MIL/uL — AB (ref 3.87–5.11)
RDW: 13.7 % (ref 11.5–15.5)
WBC: 7.6 10*3/uL (ref 4.0–10.5)

## 2015-12-27 LAB — TYPE AND SCREEN
ABO/RH(D): B POS
Antibody Screen: NEGATIVE

## 2015-12-27 LAB — RPR: RPR: NONREACTIVE

## 2015-12-27 MED ORDER — WITCH HAZEL-GLYCERIN EX PADS: 1.0000 "application " | MEDICATED_PAD | CUTANEOUS | Status: DC | PRN

## 2015-12-27 MED ORDER — LACTATED RINGERS IV SOLN
500.0000 mL | INTRAVENOUS | Status: DC | PRN
  Administered 2015-12-27: 500 mL via INTRAVENOUS

## 2015-12-27 MED ORDER — EPHEDRINE 5 MG/ML INJ
10.0000 mg | INTRAVENOUS | Status: DC | PRN
  Filled 2015-12-27: qty 2

## 2015-12-27 MED ORDER — ONDANSETRON HCL 4 MG/2ML IJ SOLN: 4.0000 mg | INTRAMUSCULAR | Status: DC | PRN

## 2015-12-27 MED ORDER — FAMOTIDINE 20 MG PO TABS
20.0000 mg | ORAL_TABLET | Freq: Two times a day (BID) | ORAL | Status: DC
  Administered 2015-12-27 – 2015-12-29 (×4): 20 mg via ORAL
  Filled 2015-12-27 (×4): qty 1

## 2015-12-27 MED ORDER — TERBUTALINE SULFATE 1 MG/ML IJ SOLN
0.2500 mg | Freq: Once | INTRAMUSCULAR | Status: DC | PRN
  Filled 2015-12-27: qty 1

## 2015-12-27 MED ORDER — OXYTOCIN BOLUS FROM INFUSION: 500.0000 mL | INTRAVENOUS | Status: DC

## 2015-12-27 MED ORDER — PANTOPRAZOLE SODIUM 40 MG PO TBEC
40.0000 mg | DELAYED_RELEASE_TABLET | Freq: Every day | ORAL | Status: DC
  Administered 2015-12-27 – 2015-12-29 (×3): 40 mg via ORAL
  Filled 2015-12-27 (×3): qty 1

## 2015-12-27 MED ORDER — SIMETHICONE 80 MG PO CHEW: 80.0000 mg | CHEWABLE_TABLET | ORAL | Status: DC | PRN

## 2015-12-27 MED ORDER — LACTATED RINGERS IV SOLN
INTRAVENOUS | Status: DC
  Administered 2015-12-27: 02:00:00 via INTRAVENOUS

## 2015-12-27 MED ORDER — FENTANYL 2.5 MCG/ML BUPIVACAINE 1/10 % EPIDURAL INFUSION (WH - ANES)
14.0000 mL/h | INTRAMUSCULAR | Status: DC | PRN
  Administered 2015-12-27 (×2): 14 mL/h via EPIDURAL
  Filled 2015-12-27 (×2): qty 125

## 2015-12-27 MED ORDER — MISOPROSTOL 200 MCG PO TABS
50.0000 ug | ORAL_TABLET | ORAL | Status: DC | PRN
  Administered 2015-12-27: 50 ug via ORAL
  Filled 2015-12-27: qty 0.5

## 2015-12-27 MED ORDER — BENZOCAINE-MENTHOL 20-0.5 % EX AERO
1.0000 "application " | INHALATION_SPRAY | CUTANEOUS | Status: DC | PRN
  Administered 2015-12-27: 1 via TOPICAL
  Filled 2015-12-27: qty 56

## 2015-12-27 MED ORDER — LIDOCAINE HCL (PF) 1 % IJ SOLN
INTRAMUSCULAR | Status: DC | PRN
  Administered 2015-12-27: 7 mL via EPIDURAL
  Administered 2015-12-27: 8 mL via EPIDURAL

## 2015-12-27 MED ORDER — TERBUTALINE SULFATE 1 MG/ML IJ SOLN: 0.2500 mg | Freq: Once | INTRAMUSCULAR | Status: DC | PRN

## 2015-12-27 MED ORDER — TETANUS-DIPHTH-ACELL PERTUSSIS 5-2.5-18.5 LF-MCG/0.5 IM SUSP
0.5000 mL | Freq: Once | INTRAMUSCULAR | Status: AC
  Administered 2015-12-29: 0.5 mL via INTRAMUSCULAR
  Filled 2015-12-27: qty 0.5

## 2015-12-27 MED ORDER — ZOLPIDEM TARTRATE 5 MG PO TABS: 5.0000 mg | ORAL_TABLET | Freq: Every evening | ORAL | Status: DC | PRN

## 2015-12-27 MED ORDER — LIDOCAINE HCL (PF) 1 % IJ SOLN
30.0000 mL | INTRAMUSCULAR | Status: DC | PRN
  Filled 2015-12-27: qty 30

## 2015-12-27 MED ORDER — PENICILLIN G POTASSIUM 5000000 UNITS IJ SOLR
2.5000 10*6.[IU] | INTRAVENOUS | Status: DC
  Administered 2015-12-27 (×2): 2.5 10*6.[IU] via INTRAVENOUS
  Filled 2015-12-27 (×6): qty 2.5

## 2015-12-27 MED ORDER — OXYTOCIN 40 UNITS IN LACTATED RINGERS INFUSION - SIMPLE MED
1.0000 m[IU]/min | INTRAVENOUS | Status: DC
  Administered 2015-12-27: 2 m[IU]/min via INTRAVENOUS

## 2015-12-27 MED ORDER — SENNOSIDES-DOCUSATE SODIUM 8.6-50 MG PO TABS
2.0000 | ORAL_TABLET | ORAL | Status: DC
  Administered 2015-12-27 – 2015-12-28 (×2): 2 via ORAL
  Filled 2015-12-27 (×2): qty 2

## 2015-12-27 MED ORDER — COCONUT OIL OIL
1.0000 "application " | TOPICAL_OIL | Status: DC | PRN
  Administered 2015-12-29: 1 via TOPICAL
  Filled 2015-12-27: qty 120

## 2015-12-27 MED ORDER — SOD CITRATE-CITRIC ACID 500-334 MG/5ML PO SOLN: 30.0000 mL | ORAL | Status: DC | PRN

## 2015-12-27 MED ORDER — EPHEDRINE 5 MG/ML INJ
10.0000 mg | INTRAVENOUS | Status: DC | PRN
  Administered 2015-12-27: 15 mg via INTRAVENOUS
  Filled 2015-12-27: qty 2

## 2015-12-27 MED ORDER — NALBUPHINE HCL 10 MG/ML IJ SOLN
10.0000 mg | Freq: Once | INTRAMUSCULAR | Status: AC
Start: 2015-12-27 — End: 2015-12-27
  Administered 2015-12-27: 10 mg via INTRAMUSCULAR
  Filled 2015-12-27: qty 1

## 2015-12-27 MED ORDER — OXYCODONE-ACETAMINOPHEN 5-325 MG PO TABS: 1.0000 | ORAL_TABLET | ORAL | Status: DC | PRN

## 2015-12-27 MED ORDER — ONDANSETRON HCL 4 MG/2ML IJ SOLN: 4.0000 mg | Freq: Four times a day (QID) | INTRAMUSCULAR | Status: DC | PRN

## 2015-12-27 MED ORDER — FLEET ENEMA 7-19 GM/118ML RE ENEM: 1.0000 | ENEMA | RECTAL | Status: DC | PRN

## 2015-12-27 MED ORDER — ACETAMINOPHEN 325 MG PO TABS
650.0000 mg | ORAL_TABLET | ORAL | Status: DC | PRN
  Administered 2015-12-28: 650 mg via ORAL
  Filled 2015-12-27: qty 2

## 2015-12-27 MED ORDER — DIBUCAINE 1 % RE OINT
1.0000 | TOPICAL_OINTMENT | RECTAL | Status: DC | PRN
Start: 2015-12-27 — End: 2015-12-29

## 2015-12-27 MED ORDER — TERBUTALINE SULFATE 1 MG/ML IJ SOLN
INTRAMUSCULAR | Status: AC
  Administered 2015-12-27: 0.25 mg via SUBCUTANEOUS
  Filled 2015-12-27: qty 1

## 2015-12-27 MED ORDER — DIPHENHYDRAMINE HCL 25 MG PO CAPS: 25.0000 mg | ORAL_CAPSULE | Freq: Four times a day (QID) | ORAL | Status: DC | PRN

## 2015-12-27 MED ORDER — ALBUTEROL SULFATE (2.5 MG/3ML) 0.083% IN NEBU: 3.0000 mL | INHALATION_SOLUTION | Freq: Four times a day (QID) | RESPIRATORY_TRACT | Status: DC | PRN

## 2015-12-27 MED ORDER — DIPHENHYDRAMINE HCL 50 MG/ML IJ SOLN: 12.5000 mg | INTRAMUSCULAR | Status: DC | PRN

## 2015-12-27 MED ORDER — PROMETHAZINE HCL 25 MG/ML IJ SOLN
12.5000 mg | Freq: Once | INTRAMUSCULAR | Status: AC
  Administered 2015-12-27: 12.5 mg via INTRAMUSCULAR
  Filled 2015-12-27: qty 1

## 2015-12-27 MED ORDER — TERBUTALINE SULFATE 1 MG/ML IJ SOLN
0.2500 mg | Freq: Once | INTRAMUSCULAR | Status: AC
  Administered 2015-12-27: 0.25 mg via SUBCUTANEOUS

## 2015-12-27 MED ORDER — NALBUPHINE HCL 10 MG/ML IJ SOLN: 10.0000 mg | INTRAMUSCULAR | Status: DC | PRN

## 2015-12-27 MED ORDER — PHENYLEPHRINE 40 MCG/ML (10ML) SYRINGE FOR IV PUSH (FOR BLOOD PRESSURE SUPPORT)
80.0000 ug | PREFILLED_SYRINGE | INTRAVENOUS | Status: DC | PRN
  Filled 2015-12-27: qty 10
  Filled 2015-12-27: qty 5

## 2015-12-27 MED ORDER — ONDANSETRON HCL 4 MG PO TABS: 4.0000 mg | ORAL_TABLET | ORAL | Status: DC | PRN

## 2015-12-27 MED ORDER — PHENYLEPHRINE 40 MCG/ML (10ML) SYRINGE FOR IV PUSH (FOR BLOOD PRESSURE SUPPORT)
80.0000 ug | PREFILLED_SYRINGE | INTRAVENOUS | Status: DC | PRN
  Filled 2015-12-27: qty 5
  Filled 2015-12-27: qty 10

## 2015-12-27 MED ORDER — ACETAMINOPHEN 325 MG PO TABS
650.0000 mg | ORAL_TABLET | ORAL | Status: DC | PRN
  Administered 2015-12-27: 650 mg via ORAL
  Filled 2015-12-27: qty 2

## 2015-12-27 MED ORDER — OXYTOCIN 40 UNITS IN LACTATED RINGERS INFUSION - SIMPLE MED: 1.0000 m[IU]/min | INTRAVENOUS | Status: DC

## 2015-12-27 MED ORDER — OXYTOCIN 40 UNITS IN LACTATED RINGERS INFUSION - SIMPLE MED
2.5000 [IU]/h | INTRAVENOUS | Status: DC
  Filled 2015-12-27: qty 1000

## 2015-12-27 MED ORDER — LACTATED RINGERS IV SOLN: 500.0000 mL | Freq: Once | INTRAVENOUS | Status: DC

## 2015-12-27 MED ORDER — PRENATAL MULTIVITAMIN CH
1.0000 | ORAL_TABLET | Freq: Every day | ORAL | Status: DC
  Administered 2015-12-28: 1 via ORAL
  Filled 2015-12-27: qty 1

## 2015-12-27 MED ORDER — PENICILLIN G POTASSIUM 5000000 UNITS IJ SOLR
5.0000 10*6.[IU] | Freq: Once | INTRAMUSCULAR | Status: AC
  Administered 2015-12-27: 5 10*6.[IU] via INTRAVENOUS
  Filled 2015-12-27: qty 5

## 2015-12-27 MED ORDER — OXYCODONE-ACETAMINOPHEN 5-325 MG PO TABS: 2.0000 | ORAL_TABLET | ORAL | Status: DC | PRN

## 2015-12-27 MED ORDER — IBUPROFEN 600 MG PO TABS
600.0000 mg | ORAL_TABLET | Freq: Four times a day (QID) | ORAL | Status: DC
  Administered 2015-12-27 – 2015-12-29 (×8): 600 mg via ORAL
  Filled 2015-12-27 (×8): qty 1

## 2015-12-27 NOTE — Anesthesia Postprocedure Evaluation (Signed)
Anesthesia Post Note  Patient: Anna Walls  Procedure(s) Performed: * No procedures listed *  Patient location during evaluation: Mother Baby Anesthesia Type: Epidural Level of consciousness: awake and alert Pain management: satisfactory to patient Vital Signs Assessment: post-procedure vital signs reviewed and stable Respiratory status: respiratory function stable Cardiovascular status: stable Postop Assessment: no headache, no backache, epidural receding, patient able to bend at knees, no signs of nausea or vomiting and adequate PO intake Anesthetic complications: no Comments: Comfort level was assessed by AnesthesiaTeam and the patient was pleased with the care, interventions, and services provided by the Department of Anesthesia.     Last Vitals:  Filed Vitals:   12/27/15 1732 12/27/15 1815  BP: 94/70 100/70  Pulse: 97 100  Temp:  36.8 C  Resp:  16    Last Pain:  Filed Vitals:   12/27/15 1822  PainSc: 7    Pain Goal: Patients Stated Pain Goal: 0 (12/27/15 1821)               Beyza Bellino

## 2015-12-27 NOTE — Anesthesia Procedure Notes (Signed)
Epidural Patient location during procedure: OB Start time: 12/27/2015 5:56 AM End time: 12/27/2015 6:01 AM  Staffing Anesthesiologist: Leilani AbleHATCHETT, Gyselle Matthew Performed by: anesthesiologist   Preanesthetic Checklist Completed: patient identified, surgical consent, pre-op evaluation, timeout performed, IV checked, risks and benefits discussed and monitors and equipment checked  Epidural Patient position: sitting Prep: site prepped and draped and DuraPrep Patient monitoring: continuous pulse ox and blood pressure Approach: midline Location: L3-L4 Injection technique: LOR air  Needle:  Needle type: Tuohy  Needle gauge: 17 G Needle length: 9 cm and 9 Needle insertion depth: 8 cm Catheter type: closed end flexible Catheter size: 19 Gauge Catheter at skin depth: 13 cm Test dose: negative and Other  Assessment Sensory level: T9 Events: blood not aspirated, injection not painful, no injection resistance, negative IV test and no paresthesia  Additional Notes Reason for block:procedure for pain

## 2015-12-27 NOTE — Progress Notes (Addendum)
Labor Progress Note Doristine MangoMaimouna Villamor is a 35 y.o. G5P4003 at 5375w5d presented for labor S:  Comfortable with epidural, no c/o.  O:  BP 113/70 mmHg  Pulse 121  Temp(Src) 97.9 F (36.6 C) (Oral)  Resp 17  Ht 5\' 4"  (1.626 m)  Wt 204 lb (92.534 kg)  BMI 35.00 kg/m2  SpO2 100%  LMP 03/24/2015 EFM: baseline 150/variability mod/+accels / no decels Toco: irregular  SVE: 5/60/-2    A/P: 35 y.o. G5P4003 5275w5d  1. Labor: protracted active phase 2. FWB: Cat I 3. Pain: epidural 4. GBS positive Pitocin augmentation. Anticipate SVD  Donette LarryMelanie Medina Degraffenreid, CNM 9:24 AM

## 2015-12-27 NOTE — Anesthesia Preprocedure Evaluation (Signed)
Anesthesia Evaluation  Patient identified by MRN, date of birth, ID band Patient awake    Reviewed: Allergy & Precautions, H&P , NPO status , Patient's Chart, lab work & pertinent test results  Airway Mallampati: II  TM Distance: >3 FB Neck ROM: full    Dental no notable dental hx.    Pulmonary neg pulmonary ROS,    Pulmonary exam normal breath sounds clear to auscultation       Cardiovascular negative cardio ROS Normal cardiovascular exam     Neuro/Psych negative psych ROS   GI/Hepatic negative GI ROS, Neg liver ROS,   Endo/Other  negative endocrine ROS  Renal/GU negative Renal ROS     Musculoskeletal   Abdominal (+) + obese,   Peds  Hematology negative hematology ROS (+)   Anesthesia Other Findings   Reproductive/Obstetrics (+) Pregnancy                             Anesthesia Physical Anesthesia Plan  ASA: II  Anesthesia Plan: Epidural   Post-op Pain Management:    Induction:   Airway Management Planned:   Additional Equipment:   Intra-op Plan:   Post-operative Plan:   Informed Consent: I have reviewed the patients History and Physical, chart, labs and discussed the procedure including the risks, benefits and alternatives for the proposed anesthesia with the patient or authorized representative who has indicated his/her understanding and acceptance.     Plan Discussed with:   Anesthesia Plan Comments:         Anesthesia Quick Evaluation

## 2015-12-27 NOTE — MAU Note (Signed)
Pt c/o contractions since this morning-now every 7 mins. Denies LOF or vag bleeding. +FM

## 2015-12-27 NOTE — Progress Notes (Signed)
LABOR PROGRESS NOTE  Anna Walls is a 35 y.o. G5P4003 at 6929w5d  admitted for SOL Subjective: Pt in incomfortable with contactions, membranes intact.  Pt requesting epidural now  Objective: BP 112/69 mmHg  Pulse 82  Temp(Src) 97.9 F (36.6 C) (Oral)  Resp 18  Ht 5\' 4"  (1.626 m)  Wt 204 lb (92.534 kg)  BMI 35.00 kg/m2  SpO2 100%  LMP 03/24/2015 or  Filed Vitals:   12/27/15 0022 12/27/15 0224 12/27/15 0434  BP: 112/83 105/65 112/69  Pulse: 107 84 82  Temp: 98.1 F (36.7 C) 97.9 F (36.6 C)   TempSrc: Oral Oral   Resp: 18 18 18   Height:  5\' 4"  (1.626 m)   Weight: 204 lb (92.534 kg) 204 lb (92.534 kg)   SpO2: 100%      Dilation: 4.5 Effacement (%): 70 Cervical Position: Middle Station: -2 Presentation: Vertex Exam by:: Dr. Alvester MorinNewton   Labs: Lab Results  Component Value Date   WBC 7.6 12/27/2015   HGB 10.1* 12/27/2015   HCT 27.4* 12/27/2015   MCV 73.3* 12/27/2015   PLT 278 12/27/2015    Patient Active Problem List   Diagnosis Date Noted  . Active labor at term 12/27/2015  . Pregnancy related back pain in third trimester, antepartum 10/16/2015  . Supervision of high risk pregnancy, antepartum 06/25/2015  . Hyperemesis affecting pregnancy, antepartum 06/25/2015    Assessment / Plan: 35 y.o. G5P4003 at 1929w5d here for SOL   Labor: Early, tachysystole noted, given terbutaline  Fetal Wellbeing:  Cat II, baseline 145, marked variables, +acels, late decels noted- 500cc bolus, terb given, reposition Pain Control:  Epidural to be placed Anticipated MOD:  NSVD  Olena LeatherwoodKelly M Zienna Ahlin, MD 12/27/2015, 5:18 AM

## 2015-12-27 NOTE — Lactation Note (Signed)
This note was copied from a baby's chart. Lactation Consultation Note  Patient Name: Anna Walls RUEAV'WToday's Date: 12/27/2015 Reason for consult: Initial assessment Baby at 6 hr of life and mom is worried baby is hungry. FOB offered 40 ml of formula because the baby was licking her lips. Mom reports having a hard time latching baby, she bf all 4 of there other children. She reports Hx of engorgement and over supply. Discussed the risks of formula and the formula feeding guidelines. Mom was tired and was not listening during visit. She did stated she wants to be but she will start tomorrow. Left handouts and instructions to call if she needs help.   Maternal Data Does the patient have breastfeeding experience prior to this delivery?: Yes  Feeding Feeding Type: Formula Nipple Type: Slow - flow Length of feed: 0 min  LATCH Score/Interventions Latch: Repeated attempts needed to sustain latch, nipple held in mouth throughout feeding, stimulation needed to elicit sucking reflex. Intervention(s): Adjust position;Assist with latch  Audible Swallowing: None Intervention(s): Skin to skin  Type of Nipple: Everted at rest and after stimulation  Comfort (Breast/Nipple): Soft / non-tender     Hold (Positioning): Assistance needed to correctly position infant at breast and maintain latch. Intervention(s): Support Pillows;Position options;Skin to skin  LATCH Score: 6  Lactation Tools Discussed/Used     Consult Status Consult Status: Follow-up Date: 12/28/15 Follow-up type: In-patient    Anna Walls 12/27/2015, 10:36 PM

## 2015-12-27 NOTE — H&P (Signed)
LABOR AND DELIVERY ADMISSION HISTORY AND PHYSICAL NOTE  Anna Walls is a 35 y.o. female 565P4003 with IUP at 7332w5d by LMP presenting for contractions.  Pt states contractions started at around 6AM today and have progressively intensified and increased in duration and frequency.     She reports positive fetal movement. She denies leakage of fluid or vaginal bleeding.  Prenatal History/Complications:  Past Medical History: Past Medical History  Diagnosis Date  . Sickle cell trait (HCC)   . Low back pain   . Chronic headaches   . Hyperemesis     Past Surgical History: History reviewed. No pertinent past surgical history.  Obstetrical History: OB History    Gravida Para Term Preterm AB TAB SAB Ectopic Multiple Living   5 4 4       3       Social History: Social History   Social History  . Marital Status: Married    Spouse Name: N/A  . Number of Children: N/A  . Years of Education: N/A   Social History Main Topics  . Smoking status: Never Smoker   . Smokeless tobacco: Never Used  . Alcohol Use: No  . Drug Use: No  . Sexual Activity: Yes    Birth Control/ Protection: None   Other Topics Concern  . None   Social History Narrative    Family History: Family History  Problem Relation Age of Onset  . Family history unknown: Yes    Allergies: No Known Allergies  Prescriptions prior to admission  Medication Sig Dispense Refill Last Dose  . omeprazole (PRILOSEC) 40 MG capsule Take 40 mg by mouth daily.   12/26/2015 at Unknown time  . Prenatal Vit-Fe Fumarate-FA (PRENATAL COMPLETE) 14-0.4 MG TABS Take 1 tablet by mouth daily. 30 each 6 Past Month at Unknown time  . ranitidine (ZANTAC) 150 MG capsule Take 1 capsule (150 mg total) by mouth 2 (two) times daily. 60 capsule 0 12/26/2015 at Unknown time  . acetaminophen (TYLENOL) 500 MG tablet Take 1,000 mg by mouth every 6 (six) hours as needed for mild pain. Reported on 12/19/2015   More than a month at Unknown time  .  albuterol (PROVENTIL HFA;VENTOLIN HFA) 108 (90 Base) MCG/ACT inhaler Inhale 1-2 puffs into the lungs every 6 (six) hours as needed for wheezing or shortness of breath. Reported on 12/19/2015   More than a month at Unknown time     Review of Systems   All systems reviewed and negative except as stated in HPI  Blood pressure 112/83, pulse 107, temperature 98.1 F (36.7 C), temperature source Oral, resp. rate 18, weight 204 lb (92.534 kg), last menstrual period 03/24/2015, SpO2 100 %. General appearance: alert and cooperative Lungs: clear to auscultation bilaterally Heart: regular rate and rhythm Abdomen: soft, non-tender; bowel sounds normal Extremities: No calf swelling or tenderness Presentation: cephalic Fetal monitoring: Baseline  Uterine activity: Ctx q1-2 min (palpated by provider) Dilation: 4 Effacement (%): 100 Exam by:: K. WeissRN   Prenatal labs: ABO, Rh: B/POS/-- (12/12 [id number]) Antibody: NEG (12/12 0958) Rubella: !Error! RPR: NON REAC (03/15 1101)  HBsAg: NEGATIVE (12/12 0958)  HIV: NONREACTIVE (03/15 1101)  GBS:   Positive 1 hr Glucola: 117 Genetic screening:  Normal Anatomy US: Normal  Prenatal Transfer Tool  Maternal Diabetes: No Genetic Screening: Normal Maternal Ultrasounds/Referrals: Normal Fetal Ultrasounds or other Referrals:  None Maternal Substance Abuse:  No Significant Maternal Medications:  None Significant Maternal Lab Results: None  No results found for this or any  previous visit (from the past 24 hour(s)).  Patient Active Problem List   Diagnosis Date Noted  . Pregnancy related back pain in third trimester, antepartum 10/16/2015  . Supervision of high risk pregnancy, antepartum 06/25/2015  . Hyperemesis affecting pregnancy, antepartum 06/25/2015    Assessment: Anna Walls is a 35 y.o. G5P4003 at [redacted]w[redacted]d here for latent labor  #Labor:Early, progressing well, membranes intact #Pain: NO, fentanyl now, considering epidural #FWB: Cat  I #ID:  GBS positive-PCN #MOF: Breast/formula #MOC:Undecided #Circ:  Female, not necessary  Olena Leatherwood 12/27/2015, 1:27 AM  OB fellow attestation: I have seen and examined this patient; I agree with above documentation in the resident's note.   Anna Walls is a 35 y.o. [id number] here for active labor  PE: BP 105/65 mmHg  Pulse 84  Temp(Src) 97.9 F (36.6 C) (Oral)  Resp 18  Ht  (1.626 m)  Wt 204 lb (92.534 kg)  BMI 35.00 kg/m2  SpO2 100%  LMP 03/24/2015 Gen: calm comfortable, NAD Resp: normal effort, no distress Abd: gravid  ROS, labs, PMH reviewed  Plan: Admit to LD Labor: expectant managment FWB:cat I ID: GBS pos, pcn  Federico Flake, MD  Family Medicine, OB Fellow 12/27/2015, 3:59 AM

## 2015-12-27 NOTE — Progress Notes (Signed)
Labor Progress Note Anna Walls is a 35 y.o. G5P4003 at 3559w5d presented for labor  S:  Comfortable with epidural. Some upper back pain.  O:  BP 102/55 mmHg  Pulse 103  Temp(Src) 98 F (36.7 C) (Oral)  Resp 18  Ht 5\' 4"  (1.626 m)  Wt 204 lb (92.534 kg)  BMI 35.00 kg/m2  SpO2 100%  LMP 03/24/2015 EFM: baseline 150 bpm/mod variability/+ accels/ no decels  Toco: 2-4 SVE: 6/60/-1; ?asynclitic AROM, clear, abundant Pitocin: 12 mu  A/P: 35 y.o. G5P4003 7559w5d  1. Labor: protracted active phase 2. FWB: Cat I 3. Pain: epidural 4. GBS positive AROM augmenation. Rotate lateral positions. Anticipate SVD.  Anna Walls, CNM 2:01 PM

## 2015-12-28 LAB — CBC
HCT: 24.9 % — ABNORMAL LOW (ref 36.0–46.0)
Hemoglobin: 9 g/dL — ABNORMAL LOW (ref 12.0–15.0)
MCH: 27 pg (ref 26.0–34.0)
MCHC: 36.1 g/dL — AB (ref 30.0–36.0)
MCV: 74.8 fL — ABNORMAL LOW (ref 78.0–100.0)
PLATELETS: 234 10*3/uL (ref 150–400)
RBC: 3.33 MIL/uL — ABNORMAL LOW (ref 3.87–5.11)
RDW: 13.7 % (ref 11.5–15.5)
WBC: 7.2 10*3/uL (ref 4.0–10.5)

## 2015-12-28 MED ORDER — OXYCODONE HCL 5 MG PO TABS
5.0000 mg | ORAL_TABLET | ORAL | Status: DC | PRN
  Administered 2015-12-28 – 2015-12-29 (×7): 5 mg via ORAL
  Filled 2015-12-28 (×7): qty 1

## 2015-12-28 NOTE — Lactation Note (Signed)
This note was copied from a baby's chart. Lactation Consultation Note Follow up visit at 26 hours of age.  Baby has had 4 breast feedings and 4 bottle feedings of formula.  Mom reports to much milk and mastitis with older children that she also breast and bottle fed.  Mom reports needing iv antibiotics one time. Mom reports nipple pain and contractions with nursing.  LC advised mom that contraction are normal, but latch pain is not.  LC advised mom to call for latch assist when having pain.  Discussed supply and demand and how bottle feeding affects breastfeeding.  Mom is planing to go back to work and concerned about baby not taking bottle then.  LC advised mom to exclusively breastfeed on demand for several weeks to establish a good milk supply and prevent soreness and trauma with working on latch technique.  Mom does not seem interested in that plan.  Mom is able to express a few drops, but used finger nails to scrape nipple.  LC advised mom to only rub colostrum on to nipples to protect them at this time.  Moms nipples are semiflat.  LC offered hand pump mom declines.  Mom to call for assist as needed.     Patient Name: Anna Doristine MangoMaimouna Chui UJWJX'BToday's Date: 12/28/2015 Reason for consult: Follow-up assessment   Maternal Data    Feeding    LATCH Score/Interventions                Intervention(s): Breastfeeding basics reviewed     Lactation Tools Discussed/Used     Consult Status Consult Status: Follow-up Date: 12/29/15 Follow-up type: In-patient    Anna Walls, Anna Walls 12/28/2015, 7:13 PM

## 2015-12-28 NOTE — Progress Notes (Signed)
Post Partum Day 1 Subjective:  Anna Walls is a 35 y.o. [id number] 4824w5d s/p NSVD.  No acute events overnight.  Pt denies problems with voiding or po intake.  She has not ambulated yet. She denies nausea or vomiting.  Pain is moderately controlled.  Lochia Small.  Plan for birth control is Paragard  Method of Feeding: breast and bottle  Objective: Blood pressure 96/59, pulse 78, temperature 98.3 F (36.8 C), temperature source Oral, resp. rate 18, height 5\' 4"  (1.626 m), weight 204 lb (92.534 kg), last menstrual period 03/24/2015, SpO2 100 %, unknown if currently breastfeeding.  Physical Exam:  General: alert, cooperative and no distress Lochia:normal flow Chest: normal WOB Heart: Regular rate Abdomen: +BS, soft, mild TTP (appropriate) Uterine Fundus: firm DVT Evaluation: No evidence of DVT seen on physical exam. Extremities: no edema   Recent Labs  12/27/15 0200 12/28/15 0623  HGB 10.1* 9.0*  HCT 27.4* 24.9*    Assessment/Plan:  ASSESSMENT: Anna Walls is a 35 y.o. G6Y4034G5P5004 6224w5d s/p NSVD  Plan for discharge tomorrow  Encourage ambulation Continue routine PP care Breastfeeding support PRN  LOS: 1 day   Almon Herculesaye T Gonfa 12/28/2015, 7:52 AM   I was present for the exam and agree with above. Pt reports an episode of heavier bleeding this morning, better now. Will keep NSL in until this afternoon and encourage frequent urination.   [place] City[place]ia Tira Lafferty, CNM 12/28/2015 11:52 AM

## 2015-12-29 MED ORDER — IBUPROFEN 600 MG PO TABS: 600.0000 mg | ORAL_TABLET | Freq: Four times a day (QID) | ORAL | Status: DC

## 2015-12-29 NOTE — Discharge Summary (Signed)
OB Discharge Summary     Patient Name: Anna Walls DOB: Apr 07, 1981 MRN: [id number]  Date of admission: 12/27/2015 Delivering MD: Donette Larry   Date of discharge: 12/29/2015  Admitting diagnosis: 39 WEEKS CTX VAGINAL PAIN Intrauterine pregnancy: [redacted]w[redacted]d     Secondary diagnosis:  Principal Problem:   NSVD (normal spontaneous vaginal delivery) Active Problems:   Supervision of high risk pregnancy, antepartum   Hyperemesis affecting pregnancy, antepartum   Pregnancy related back pain in third trimester, antepartum  Additional problems: none     Discharge diagnosis: Term Pregnancy Delivered                                                                                                Post partum procedures:None  Augmentation: AROM, Pitocin and Cytotec  Complications: None  Hospital course:  Onset of Labor With Vaginal Delivery     35 y.o. yo N8G9562 at [redacted]w[redacted]d was admitted in Active Labor on 12/27/2015. Patient had an uncomplicated labor course as follows:  Membrane Rupture Time/Date: 1:55 PM ,12/27/2015   Intrapartum Procedures: Episiotomy: None [1]                                         Lacerations:  None [1]  Patient had a delivery of a Viable infant. 12/27/2015  Information for the patient's newborn:  [place], Alfredo [[id number]]  Delivery Method: Vaginal, Spontaneous Delivery (Filed from Delivery Summary)    Pateint had an uncomplicated postpartum course.  She is ambulating, tolerating a regular diet, passing flatus, and urinating well. Patient is discharged home in stable condition on 12/29/2015.    Physical exam  Filed Vitals:   12/27/15 1815 12/27/15 1935 12/27/15 2300 12/28/15 0525  BP: 100/70 101/60 116/67 96/59  Pulse: 100 92 104 78  Temp: 98.2 F (36.8 C) 97.8 F (36.6 C) 98.4 F (36.9 C) 98.3 F (36.8 C)  TempSrc: Oral Oral Oral Oral  Resp: Height:      Weight:      SpO2:  99% 100%    General: alert, cooperative and no  distress Lochia: appropriate Uterine Fundus: firm Incision: N/A DVT Evaluation: No evidence of DVT seen on physical exam. Negative Homan's sign. Labs: Lab Results  Component Value Date   WBC 7.2 12/28/2015   HGB 9.0* 12/28/2015   HCT 24.9* 12/28/2015   MCV 74.8* 12/28/2015   PLT 234 12/28/2015   CMP Latest Ref Rng 09/13/2015  Glucose 65 - 99 mg/dL 90  BUN 6 - 20 mg/dL <6(N)  Creatinine 6.29 - 1.00 mg/dL 5.28  Sodium 413 - 244 mmol/L 138  Potassium 3.5 - 5.1 mmol/L 3.9  Chloride 101 - 111 mmol/L 105  CO2 22 - 32 mmol/L 22  Calcium 8.9 - 10.3 mg/dL 9.3  Total Protein 6.5 - 8.1 g/dL -  Total Bilirubin 0.3 - 1.2 mg/dL -  Alkaline Phos 38 - 010 U/L -  AST 15 - 41 U/L -  ALT 14 - 54 U/L -  Discharge instruction: per After Visit Summary and "Baby and Me Booklet".  After visit meds:    Medication List    TAKE these medications        albuterol 108 (90 Base) MCG/ACT inhaler  Commonly known as:  PROVENTIL HFA;VENTOLIN HFA  Inhale 1-2 puffs into the lungs every 6 (six) hours as needed for wheezing or shortness of breath. Reported on 12/19/2015     ibuprofen 600 MG tablet  Commonly known as:  ADVIL,MOTRIN  Take 1 tablet (600 mg total) by mouth every 6 (six) hours.     omeprazole 40 MG capsule  Commonly known as:  PRILOSEC  Take 40 mg by mouth daily.     PRENATAL COMPLETE 14-0.4 MG Tabs  Take 1 tablet by mouth daily.     ranitidine 150 MG capsule  Commonly known as:  ZANTAC  Take 1 capsule (150 mg total) by mouth 2 (two) times daily.        Diet: routine diet  Activity: Advance as tolerated. Pelvic rest for 6 weeks.   Outpatient follow up:6 weeks Follow up Appt:Future Appointments Date Time Provider Department Center  01/02/2016 9:40 AM Dorathy KinsmanVirginia Smith, CNM [facility]   Follow up Visit:No Follow-up on file.  Postpartum contraception: IUD Paragard  Newborn Data: Live born female  Birth Weight: 7 lb 6.9 oz (3371 g) APGAR: 8, 9  Baby Feeding:  Breast Disposition:home with mother   12/29/2015 Federico FlakeKimberly Niles Kaylem Gidney, MD

## 2015-12-29 NOTE — Lactation Note (Signed)
This note was copied from a baby's chart. Lactation Consultation Note  FOB states baby has not woken since 0400 to feed.  Baby sucking on pacifier. Pacifier use not recommended at this time.  Discussed waking techniques including undressing baby for feeding if needed. Mother has abrasions on tips of nipples.  Encouraged her to apply ebm and requested RN to bring coconut oil. Mother latched baby with #24NS which is too large.  Had mother hand express drops of breastmilk into NS before latching. Baby latched onto #24NS and came right off.  Provided mother w/ #20NS but suggest breastfeeding without NS. Baby breastfed for 15 min on L side without NS.  Baby needed stimulation and mother winced in pain during feeding. Mother is having latch pain but is also having painful cramps during feeding. Assisted mother w/ having a deeper latch and to massage breast to keep baby active. Undressed baby and assisted w/ latching on R side.   Provided mother w/ 2 hand pumps and suggest after breastfeeding or if she is too sore to tolerate breastfeeding she should pump for 15 min. Mother needed encouragement to keep baby active and sustain a deep latch. Discussed supplementing after feeding with pumped breastmilk or formula. Encouraged waking baby after 3 hours and place STS. Mom encouraged to feed baby 8-12 times/24 hours and with feeding cues.  Reviewed engorgement care and monitoring voids/stools.     Patient Name: Anna Walls Reason for consult: Follow-up assessment   Maternal Data    Feeding Feeding Type: Breast Fed  LATCH Score/Interventions Latch: Grasps breast easily, tongue down, lips flanged, rhythmical sucking. Intervention(s): Adjust position;Assist with latch;Breast massage;Breast compression  Audible Swallowing: A few with stimulation Intervention(s): Hand expression Intervention(s): Hand expression;Alternate breast massage  Type of Nipple: Everted  at rest and after stimulation  Comfort (Breast/Nipple): Engorged, cracked, bleeding, large blisters, severe discomfort Problem noted:  (abrasion)  Problem noted: Severe discomfort Interventions (Severe discomfort):  (manual pump/coconut oil from RN)  Hold (Positioning): Assistance needed to correctly position infant at breast and maintain latch.  LATCH Score: 6  Lactation Tools Discussed/Used     Consult Status Consult Status: Complete    Anna Walls, Anna Walls Walls, 10:33 AM

## 2015-12-29 NOTE — Discharge Instructions (Signed)
Iron-Rich Diet ° °Iron is a mineral that helps your body to produce hemoglobin. Hemoglobin is a protein in your red blood cells that carries oxygen to your body's tissues. Eating too little iron may cause you to feel weak and tired, and it can increase your risk for infection. Eating enough iron is necessary for your body's metabolism, muscle function, and nervous system. °Iron is naturally found in many foods. It can also be added to foods or fortified in foods. There are two types of dietary iron: °· Heme iron. Heme iron is absorbed by the body more easily than nonheme iron. Heme iron is found in meat, poultry, and fish. °· Nonheme iron. Nonheme iron is found in dietary supplements, iron-fortified grains, beans, and vegetables. °You may need to follow an iron-rich diet if: °· You have been diagnosed with iron deficiency or iron-deficiency anemia. °· You have a condition that prevents you from absorbing dietary iron, such as: °¨ Infection in your intestines. °¨ Celiac disease. This involves long-lasting (chronic) inflammation of your intestines. °· You do not eat enough iron. °· You eat a diet that is high in foods that impair iron absorption. °· You have lost a lot of blood. °· You have heavy bleeding during your menstrual cycle. °· You are pregnant. °WHAT IS MY PLAN? °Your health care provider may help you to determine how much iron you need per day based on your condition. Generally, when a person consumes sufficient amounts of iron in the diet, the following iron needs are met: °· Men. °¨ 14-18 years old: 11 mg per day. °¨ 19-50 years old: 8 mg per day. °· Women.   °¨ 14-18 years old: 15 mg per day. °¨ 19-50 years old: 18 mg per day. °¨ Over 50 years old: 8 mg per day. °¨ Pregnant women: 27 mg per day. °¨ Breastfeeding women: 9 mg per day. °WHAT DO I NEED TO KNOW ABOUT AN IRON-RICH DIET? °· Eat fresh fruits and vegetables that are high in vitamin C along with foods that are high in iron. This will help increase  the amount of iron that your body absorbs from food, especially with foods containing nonheme iron. Foods that are high in vitamin C include oranges, peppers, tomatoes, and mango. °· Take iron supplements only as directed by your health care provider. Overdose of iron can be life-threatening. If you were prescribed iron supplements, take them with orange juice or a vitamin C supplement. °· Cook foods in pots and pans that are made from iron.   °· Eat nonheme iron-containing foods alongside foods that are high in heme iron. This helps to improve your iron absorption.   °· Certain foods and drinks contain compounds that impair iron absorption. Avoid eating these foods in the same meal as iron-rich foods or with iron supplements. These include: °¨ Coffee, black tea, and red wine. °¨ Milk, dairy products, and foods that are high in calcium. °¨ Beans, soybeans, and peas. °¨ Whole grains. °· When eating foods that contain both nonheme iron and compounds that impair iron absorption, follow these tips to absorb iron better.   °¨ Soak beans overnight before cooking. °¨ Soak whole grains overnight and drain them before using. °¨ Ferment flours before baking, such as using yeast in bread dough. °WHAT FOODS CAN I EAT? °Grains  °Iron-fortified breakfast cereal. Iron-fortified whole-wheat bread. Enriched rice. Sprouted grains. °Vegetables  °Spinach. Potatoes with skin. Green peas. Broccoli. Red and green bell peppers. Fermented vegetables. °Fruits  °Prunes. Raisins. Oranges. Strawberries. Mango. Grapefruit. °Meats and Other   Protein Sources Beef liver. Oysters. Beef. Shrimp. [place]. Chicken. [place]. Sardines. Chickpeas. Nuts. Tofu. Beverages Tomato juice. Fresh orange juice. Prune juice. Hibiscus tea. Fortified instant breakfast shakes. Condiments Tahini. Fermented soy sauce. Sweets and Desserts Black-strap molasses.  Other Wheat germ. The items listed above may not be a complete list of recommended foods or  beverages. Contact your dietitian for more options. WHAT FOODS ARE NOT RECOMMENDED? Grains Whole grains. Bran cereal. Bran flour. Oats. Vegetables Artichokes. Brussels sprouts. Kale. Fruits Blueberries. Raspberries. Strawberries. Figs. Meats and Other Protein Sources Soybeans. Products made from soy protein. Dairy Milk. Cream. Cheese. Yogurt. Cottage cheese. Beverages Coffee. Black tea. Red wine. Sweets and Desserts Cocoa. Chocolate. Ice cream. Other Basil. Oregano. Parsley. The items listed above may not be a complete list of foods and beverages to avoid. Contact your dietitian for more information.   This information is not intended to replace advice given to you by your health care provider. Make sure you discuss any questions you have with your health care provider.   Document Released: 02/11/2005 Document Revised: 07/21/2014 Document Reviewed: 01/25/2014 Elsevier Interactive Patient Education [employer].

## 2016-01-02 ENCOUNTER — Ambulatory Visit (HOSPITAL_COMMUNITY)
Admission: RE | Admit: 2016-01-02 | Discharge: 2016-01-02 | Disposition: A | Discharge status: Home / Self Care | Payer: Medicaid Other | Source: Ambulatory Visit | Attending: Obstetrics & Gynecology | Admitting: Obstetrics & Gynecology

## 2016-01-02 ENCOUNTER — Encounter: Payer: Medicaid Other | Admitting: Advanced Practice Midwife

## 2016-01-02 NOTE — Lactation Note (Signed)
Lactation Consult - walk in   mom presented for [facility] appt. At 10:20 am and mentioned she was scheduled for LC O/P. Scheduled checked and for the next 2 weeks and mom / baby weren't on the schedule.    Mother's reason for visit: per mom help with breast feeding - sore nipples left worse than right  Visit Type:  Feeding assessment -  Appointment Notes:  None - walk in  Consult:  Initial Lactation Consultant:  Kathrin Greathouse  ________________________________________________________________________ Baby's Name: Trilby Leaver Date of Birth: 12/27/2013 Pediatrician: [facility]- [address], [place] - # [phone] -  Dr. Gearldine Shown  Gender: female Gestational Age: [redacted]w[redacted]d (At Birth) Birth Weight: 7 lb 6.9 oz (3371 g) Weight at Discharge: Weight: 7 lb 3.8 oz (3283 g)Date of Discharge: 12/29/2015 Filed Weights   12/27/15 1603 12/27/15 2337 12/29/15 0015  Weight: 7 lb 6.9 oz (3371 g) 7 lb 7.2 oz (3379 g) 7 lb 3.8 oz (3283 g)   [facility]: 7-8 oz -6/20  Location:[facility] Weight today: 3376 g , 7-7.1 oz      ________________________________________________________________________  Mother's Name: Chanique Harriss Type of delivery:   Breastfeeding Experience: per mom 5 th baby, had mastitis with all 4 and breast fed the last infant ( which is now 6 Yo )  The longest  Maternal Medical Conditions:  No risk for milk supply - mom reports multiply breast changes with pregnancy  Maternal Medications:  Per mom Motrin, Zantac , Pro  ________________________________________________________________________  Breastfeeding History (Post Discharge)  Frequency of breastfeeding: every 2-3 hours  Duration of feeding:  20 mins ( per mom she does a lot of on and off latching )   Supplementing: with EBM in a Avent bottle if needed- working on the breast feeding   Pumping: only with a  hand pump - able to pump off 5 oz the max.  1/2 prior to latch pump off 5 oz   Infant Intake and Output Assessment  Voids:  8  in 24 hrs.  Color:  Clear yellow Stools: 5  in 24 hrs.  Color:  Yellow  ________________________________________________________________________  Maternal Breast Assessment  Breast:  Full Nipple:  Flat -  Compressible areolas and erect nipples after pumping off the fullness  Pain level:  10 Pain interventions:  Expressed breast milk and coconut oil   _______________________________________________________________________ Feeding Assessment/Evaluation  Initial feeding assessment:  Infant's oral assessment:  Variance - small mouth, upper lip stretches well with oral exam and when latched with a nipple shield  Baby able to extend tongue over gum line 1/8 inch , and raise it almost above the corners of the mouth , strong suck noted, semi  High palate. No humping of the back of the tongue when abby sucking on gloved finger.   Positioning:  Football Right breast  LATCH documentation:  Latch:  1 = Repeated attempts needed to sustain latch, nipple held in mouth throughout feeding, stimulation needed to elicit sucking reflex.  Audible swallowing:  2 = Spontaneous and intermittent  Type of nipple:  1 = Flat  Comfort (Breast/Nipple):  1 = Filling, red/small blisters or bruises, mild/mod discomfort to 0   Hold (Positioning):  1 = Assistance needed to correctly position infant at breast and maintain latch  LATCH score:  5-6   Attached assessment:  Shallow - @ 1st . Improved with NS ,   Lips flanged:  Yes.    Lips untucked:  No.  Suck assessment:  Nutritive and Nonnutritive  Tools:  Nipple shield 24 mm / DEBP  Instructed on use and cleaning of tool:  Yes.    Pre-feed weight:  3376 g , 7-7.1 oz  Post-feed weight:  3388 g , 7-7.5 oz  Amount transferred:  12 ml  Amount supplemented:  60 ml of EBM     Total amount pumped post feed: 60 ml with hand pump and  DEBP at consult   Total amount transferred:  12 ml ( mom has plenty of milk both breast - see LC note  Total supplement given:  60 ml EBM  Total for feeding = 72 ml   Lactation Impression: Walk in  Boarder line engorged - had to pre - pump work on latching and depth  Was able to finally  latch the baby with a #24 NS , and mom was in to much discomfort and asked to release latch.  Baby transferred 12 ml and finished the feeding with supplementing EBM in a bottle  LC [facility] and obtain a DEBP and keep pumping until soreness cleared  And then re-latch , see LC Plan below.    Lactation plan of Care: Feedings - with cues , every 2 1/2 - 3 hours  Until soreness of nipples improve feed with a medium broad based nipple  Sore nipple and engorgement prevention and tx - EBM to nipples liberally  Coconut to nipples ( dab ) before pumping to decrease friction - also check flanges ( has #24 and #27 )  Pump at least 8 times a day and PRN  Important - once sore nipples improved work on latching  To protect establishing milk supply at least pump both breast every 2-3 hours - 15 -20 mins ( 8's in 24 hours ) and when necessary  Even if you have to use your hand pump  Shells also will help between feeding while awake

## 2016-01-03 ENCOUNTER — Encounter (HOSPITAL_COMMUNITY): Payer: Medicaid Other

## 2016-01-06 ENCOUNTER — Inpatient Hospital Stay (HOSPITAL_COMMUNITY): Payer: Medicaid Other

## 2016-01-06 ENCOUNTER — Encounter (HOSPITAL_COMMUNITY): Payer: Self-pay

## 2016-01-06 ENCOUNTER — Observation Stay (HOSPITAL_COMMUNITY)
Admission: AD | Admit: 2016-01-06 | Discharge: 2016-01-11 | Disposition: A | Discharge status: Home / Self Care | Payer: Medicaid Other | Source: Ambulatory Visit | Attending: Obstetrics & Gynecology | Admitting: Obstetrics & Gynecology

## 2016-01-06 ENCOUNTER — Observation Stay (HOSPITAL_COMMUNITY): Payer: Medicaid Other

## 2016-01-06 ENCOUNTER — Other Ambulatory Visit: Payer: Self-pay

## 2016-01-06 DIAGNOSIS — I309 Acute pericarditis, unspecified: Secondary | ICD-10-CM | POA: Diagnosis not present

## 2016-01-06 DIAGNOSIS — N61 Mastitis without abscess: Secondary | ICD-10-CM | POA: Diagnosis not present

## 2016-01-06 DIAGNOSIS — R079 Chest pain, unspecified: Secondary | ICD-10-CM | POA: Diagnosis not present

## 2016-01-06 DIAGNOSIS — R0602 Shortness of breath: Secondary | ICD-10-CM | POA: Diagnosis present

## 2016-01-06 DIAGNOSIS — J810 Acute pulmonary edema: Secondary | ICD-10-CM | POA: Insufficient documentation

## 2016-01-06 DIAGNOSIS — O1495 Unspecified pre-eclampsia, complicating the puerperium: Principal | ICD-10-CM | POA: Diagnosis present

## 2016-01-06 LAB — CBC
HCT: 27.2 % — ABNORMAL LOW (ref 36.0–46.0)
HCT: 26.8 % — ABNORMAL LOW (ref 36.0–46.0)
Hemoglobin: 9.8 g/dL — ABNORMAL LOW (ref 12.0–15.0)
Hemoglobin: 9.5 g/dL — ABNORMAL LOW (ref 12.0–15.0)
MCH: 26.8 pg (ref 26.0–34.0)
MCH: 26.5 pg (ref 26.0–34.0)
MCHC: 36 g/dL (ref 30.0–36.0)
MCHC: 35.4 g/dL (ref 30.0–36.0)
MCV: 74.3 fL — AB (ref 78.0–100.0)
MCV: 74.7 fL — AB (ref 78.0–100.0)
PLATELETS: 317 10*3/uL (ref 150–400)
PLATELETS: 340 10*3/uL (ref 150–400)
RBC: 3.66 MIL/uL — AB (ref 3.87–5.11)
RBC: 3.59 MIL/uL — ABNORMAL LOW (ref 3.87–5.11)
RDW: 13.8 % (ref 11.5–15.5)
RDW: 13.8 % (ref 11.5–15.5)
WBC: 7.4 10*3/uL (ref 4.0–10.5)
WBC: 8.4 10*3/uL (ref 4.0–10.5)

## 2016-01-06 LAB — URINE MICROSCOPIC-ADD ON

## 2016-01-06 LAB — COMPREHENSIVE METABOLIC PANEL
ALT: 23 U/L (ref 14–54)
AST: 14 U/L — AB (ref 15–41)
Albumin: 2.9 g/dL — ABNORMAL LOW (ref 3.5–5.0)
Alkaline Phosphatase: 86 U/L (ref 38–126)
Anion gap: 4 — ABNORMAL LOW (ref 5–15)
BUN: 17 mg/dL (ref 6–20)
CHLORIDE: 108 mmol/L (ref 101–111)
CO2: 26 mmol/L (ref 22–32)
CREATININE: 0.69 mg/dL (ref 0.44–1.00)
Calcium: 8.2 mg/dL — ABNORMAL LOW (ref 8.9–10.3)
GFR calc Af Amer: >60 mL/min (ref >60)
GLUCOSE: 104 mg/dL — AB (ref 65–99)
Potassium: 3.2 mmol/L — ABNORMAL LOW (ref 3.5–5.1)
Sodium: 138 mmol/L (ref 135–145)
Total Bilirubin: 0.9 mg/dL (ref 0.3–1.2)
Total Protein: 6.4 g/dL — ABNORMAL LOW (ref 6.5–8.1)

## 2016-01-06 LAB — TYPE AND SCREEN
ABO/RH(D): B POS
Antibody Screen: NEGATIVE

## 2016-01-06 LAB — URINALYSIS, ROUTINE W REFLEX MICROSCOPIC
BILIRUBIN URINE: NEGATIVE
Glucose, UA: NEGATIVE mg/dL
Ketones, ur: NEGATIVE mg/dL
NITRITE: NEGATIVE
Protein, ur: NEGATIVE mg/dL
SPECIFIC GRAVITY, URINE: 1.01 (ref 1.005–1.030)
pH: 6 (ref 5.0–8.0)

## 2016-01-06 LAB — CREATININE, SERUM
Creatinine, Ser: 0.67 mg/dL (ref 0.44–1.00)
GFR calc Af Amer: >60 mL/min (ref >60)
GFR calc non Af Amer: >60 mL/min (ref >60)

## 2016-01-06 LAB — PROTEIN / CREATININE RATIO, URINE
CREATININE, URINE: 117 mg/dL
Protein Creatinine Ratio: 0.22 mg/mg{Cre} — ABNORMAL HIGH (ref 0.00–0.15)
Total Protein, Urine: 26 mg/dL

## 2016-01-06 MED ORDER — NIFEDIPINE ER OSMOTIC RELEASE 30 MG PO TB24
60.0000 mg | ORAL_TABLET | Freq: Once | ORAL | Status: AC
  Administered 2016-01-06: 60 mg via ORAL
  Filled 2016-01-06: qty 2

## 2016-01-06 MED ORDER — FUROSEMIDE 10 MG/ML IJ SOLN
80.0000 mg | Freq: Once | INTRAMUSCULAR | Status: AC
  Administered 2016-01-06: 80 mg via INTRAVENOUS
  Filled 2016-01-06: qty 8

## 2016-01-06 MED ORDER — DIPHENHYDRAMINE HCL 50 MG/ML IJ SOLN
25.0000 mg | Freq: Once | INTRAMUSCULAR | Status: AC
  Administered 2016-01-06: 25 mg via INTRAVENOUS
  Filled 2016-01-06: qty 1

## 2016-01-06 MED ORDER — LABETALOL HCL 5 MG/ML IV SOLN
20.0000 mg | INTRAVENOUS | Status: AC | PRN
  Administered 2016-01-06: 40 mg via INTRAVENOUS
  Administered 2016-01-06: 80 mg via INTRAVENOUS
  Administered 2016-01-06: 20 mg via INTRAVENOUS
  Filled 2016-01-06: qty 8
  Filled 2016-01-06: qty 4
  Filled 2016-01-06: qty 16

## 2016-01-06 MED ORDER — CALCIUM CARBONATE ANTACID 500 MG PO CHEW
2.0000 | CHEWABLE_TABLET | ORAL | Status: DC | PRN
  Administered 2016-01-11: 400 mg via ORAL
  Filled 2016-01-06 (×2): qty 2

## 2016-01-06 MED ORDER — HYDRALAZINE HCL 20 MG/ML IJ SOLN
10.0000 mg | Freq: Once | INTRAMUSCULAR | Status: AC | PRN
  Administered 2016-01-06: 10 mg via INTRAVENOUS
  Filled 2016-01-06: qty 1

## 2016-01-06 MED ORDER — MAGNESIUM SULFATE BOLUS VIA INFUSION
6.0000 g | Freq: Once | INTRAVENOUS | Status: AC
  Administered 2016-01-06: 6 g via INTRAVENOUS
  Filled 2016-01-06: qty 500

## 2016-01-06 MED ORDER — LACTATED RINGERS IV SOLN
INTRAVENOUS | Status: DC
  Administered 2016-01-06 – 2016-01-07 (×2): via INTRAVENOUS

## 2016-01-06 MED ORDER — METOCLOPRAMIDE HCL 5 MG/ML IJ SOLN
10.0000 mg | Freq: Once | INTRAMUSCULAR | Status: AC
  Administered 2016-01-06: 10 mg via INTRAVENOUS
  Filled 2016-01-06: qty 2

## 2016-01-06 MED ORDER — DEXAMETHASONE SODIUM PHOSPHATE 10 MG/ML IJ SOLN
10.0000 mg | Freq: Once | INTRAMUSCULAR | Status: AC
Start: 2016-01-06 — End: 2016-01-06
  Administered 2016-01-06: 10 mg via INTRAVENOUS
  Filled 2016-01-06: qty 1

## 2016-01-06 MED ORDER — HYDRALAZINE HCL 20 MG/ML IJ SOLN: 10.0000 mg | Freq: Once | INTRAMUSCULAR | Status: DC | PRN

## 2016-01-06 MED ORDER — PRENATAL MULTIVITAMIN CH
1.0000 | ORAL_TABLET | Freq: Every day | ORAL | Status: DC
  Administered 2016-01-07 – 2016-01-11 (×5): 1 via ORAL
  Filled 2016-01-06 (×6): qty 1

## 2016-01-06 MED ORDER — MAGNESIUM SULFATE 50 % IJ SOLN
2.0000 g/h | INTRAVENOUS | Status: AC
  Administered 2016-01-07: 2 g/h via INTRAVENOUS
  Filled 2016-01-06 (×2): qty 80

## 2016-01-06 MED ORDER — GI COCKTAIL ~~LOC~~
30.0000 mL | ORAL | Status: AC
Start: 2016-01-06 — End: 2016-01-06
  Administered 2016-01-06: 30 mL via ORAL
  Filled 2016-01-06: qty 30

## 2016-01-06 MED ORDER — LABETALOL HCL 5 MG/ML IV SOLN: 20.0000 mg | INTRAVENOUS | Status: DC | PRN

## 2016-01-06 MED ORDER — ACETAMINOPHEN 325 MG PO TABS
650.0000 mg | ORAL_TABLET | ORAL | Status: DC | PRN
  Administered 2016-01-07 – 2016-01-11 (×9): 650 mg via ORAL
  Filled 2016-01-06 (×9): qty 2

## 2016-01-06 MED ORDER — ENOXAPARIN SODIUM 30 MG/0.3ML ~~LOC~~ SOLN
30.0000 mg | SUBCUTANEOUS | Status: DC
  Filled 2016-01-06: qty 0.3

## 2016-01-06 MED ORDER — DOCUSATE SODIUM 100 MG PO CAPS
100.0000 mg | ORAL_CAPSULE | Freq: Every day | ORAL | Status: DC
  Filled 2016-01-06: qty 1

## 2016-01-06 MED ORDER — ENOXAPARIN SODIUM 40 MG/0.4ML ~~LOC~~ SOLN
40.0000 mg | SUBCUTANEOUS | Status: DC
  Administered 2016-01-07: 40 mg via SUBCUTANEOUS
  Filled 2016-01-06: qty 0.4

## 2016-01-06 MED ORDER — NIFEDIPINE ER 30 MG PO TB24
30.0000 mg | ORAL_TABLET | Freq: Two times a day (BID) | ORAL | Status: DC
  Administered 2016-01-07 – 2016-01-11 (×9): 30 mg via ORAL
  Filled 2016-01-06 (×11): qty 1

## 2016-01-06 MED ORDER — IOPAMIDOL (ISOVUE-370) INJECTION 76%
100.0000 mL | Freq: Once | INTRAVENOUS | Status: AC | PRN
  Administered 2016-01-06: 100 mL via INTRAVENOUS

## 2016-01-06 NOTE — MAU Provider Note (Addendum)
Chief Complaint: HA, chest pressure and SOB postpartum   SUBJECTIVE HPI: Anna Walls is a 35 y.o. [id number] on PPD# 10 following NSVD who presents to maternity admissions reporting shortness of breath, chest pain, and migraine starting 3 days ago.  She is taking BC Powder for h/a for >24 hours with no resolution of the HA.  Pt reports that her sx are worse with lyin flat. She reports she feels like something is tight in her throat and it is hard to take a deep breath.  The chest pain is described as tightness and pressure from her throat down to her upper abdomen.  It is constant and unchanged.  She denies visual disturbances or n/v.  She is breastfeeding/pumping. She reports light lochia and denies abdominal pain, vaginal itching/burning, urinary symptoms, dizziness, or fever/chills.  This is her 5th pregnancy and she denies any prev PP problems.  Pt denies h/o SOB.     HPI  Past Medical History  Diagnosis Date  . Sickle cell trait (HCC)   . Low back pain   . Chronic headaches   . Hyperemesis    History reviewed. No pertinent past surgical history. Social History   Social History  . Marital Status: Married    Spouse Name: N/A  . Number of Children: N/A  . Years of Education: N/A   Occupational History  . Not on file.   Social History Main Topics  . Smoking status: Never Smoker   . Smokeless tobacco: Never Used  . Alcohol Use: No  . Drug Use: No  . Sexual Activity: Yes    Birth Control/ Protection: None   Other Topics Concern  . Not on file   Social History Narrative   No current facility-administered medications on file prior to encounter.   Current Outpatient Prescriptions on File Prior to Encounter  Medication Sig Dispense Refill  . albuterol (PROVENTIL HFA;VENTOLIN HFA) 108 (90 Base) MCG/ACT inhaler Inhale 1-2 puffs into the lungs every 6 (six) hours as needed for wheezing or shortness of breath. Reported on 12/19/2015    . ibuprofen (ADVIL,MOTRIN) 600 MG tablet  Take 1 tablet (600 mg total) by mouth every 6 (six) hours. 60 tablet 0  . omeprazole (PRILOSEC) 40 MG capsule Take 40 mg by mouth daily.    . Prenatal Vit-Fe Fumarate-FA (PRENATAL COMPLETE) 14-0.4 MG TABS Take 1 tablet by mouth daily. 30 each 6  . ranitidine (ZANTAC) 150 MG capsule Take 1 capsule (150 mg total) by mouth 2 (two) times daily. 60 capsule 0   No Known Allergies  ROS:  Review of Systems  Constitutional: Negative for fever, chills and fatigue.  HENT: Positive for sore throat. Negative for postnasal drip and rhinorrhea.   Eyes: Negative for visual disturbance.  Respiratory: Positive for cough and shortness of breath.   Cardiovascular: Positive for chest pain.  Gastrointestinal: Negative for nausea, vomiting and abdominal pain.  Genitourinary: Positive for vaginal bleeding. Negative for dysuria, flank pain, vaginal discharge, difficulty urinating, vaginal pain and pelvic pain.  Neurological: Positive for headaches. Negative for dizziness.  Psychiatric/Behavioral: Negative.      I have reviewed patient's Past Medical Hx, Surgical Hx, Family Hx, Social Hx, medications and allergies.   Physical Exam  Patient Vitals for the past 24 hrs:  BP Temp Pulse Resp SpO2 Height Weight  01/06/16 2004 - - 80 - 98 % - -  01/06/16 2002 153/94 mmHg - 76 - - - -  01/06/16 1959 - - 75 - 97 % - -  01/06/16 1957 - - 78 - 97 % - -  01/06/16 1952 - - 76 - 95 % - -  01/06/16 1947 163/89 mmHg - 76 - 94 % - -  01/06/16 1946 - - 76 - - - -  01/06/16 1942 - - 83 - 94 % - -  01/06/16 1941 - - 83 - 94 % - -  01/06/16 1937 - - 78 - 93 % - -  01/06/16 1932 151/92 mmHg - 75 - 93 % - -  01/06/16 1931 - - 75 - - - -  01/06/16 1919 149/95 mmHg 98.5 F (36.9 C) 80 24 93 % 5\' 4"  (1.626 m) 207 lb (93.895 kg)   Constitutional: Well-developed, well-nourished female in no acute distress. Pt sitting upright and panting with breathing HEART: normal rate, heart sounds, regular rhythm RESP:  lung sounds clear  and equal bilaterally but diminished in lower lobes, coughing with deep breathing GI: Abd soft, non-tender. Pos BS x 4. Fundus: below umbilicus MS: Extremities nontender, 3+pitting edema; normal ROM Neurologic: Alert and oriented x 4.  GU: Neg CVAT. PELVIC EXAM: N/A   LAB RESULTS Results for orders placed or performed during the hospital encounter of 01/06/16 (from the past 24 hour(s))  Protein / creatinine ratio, urine     Status: Abnormal   Collection Time: 01/06/16  7:17 PM  Result Value Ref Range   Creatinine, Urine 117.00 mg/dL   Total Protein, Urine 26 mg/dL   Protein Creatinine Ratio 0.22 (H) 0.00 - 0.15 mg/mg[Cre]  Urinalysis, Routine w reflex microscopic ([facility])     Status: Abnormal   Collection Time: 01/06/16  7:17 PM  Result Value Ref Range   Color, Urine YELLOW YELLOW   APPearance CLEAR CLEAR   Specific Gravity, Urine 1.010 1.005 - 1.030   pH 6.0 5.0 - 8.0   Glucose, UA NEGATIVE NEGATIVE mg/dL   Hgb urine dipstick MODERATE (A) NEGATIVE   Bilirubin Urine NEGATIVE NEGATIVE   Ketones, ur NEGATIVE NEGATIVE mg/dL   Protein, ur NEGATIVE NEGATIVE mg/dL   Nitrite NEGATIVE NEGATIVE   Leukocytes, UA TRACE (A) NEGATIVE  Urine microscopic-add on     Status: Abnormal   Collection Time: 01/06/16  7:17 PM  Result Value Ref Range   Squamous Epithelial / LPF 0-5 (A) NONE SEEN   WBC, UA 0-5 0 - 5 WBC/hpf   RBC / HPF 6-30 0 - 5 RBC/hpf   Bacteria, UA FEW (A) NONE SEEN  CBC     Status: Abnormal   Collection Time: 01/06/16  7:46 PM  Result Value Ref Range   WBC 7.4 4.0 - 10.5 K/uL   RBC 3.66 (L) 3.87 - 5.11 MIL/uL   Hemoglobin 9.8 (L) 12.0 - 15.0 g/dL   HCT 45.427.2 (L) 09.836.0 - 11.946.0 %   MCV 74.3 (L) 78.0 - 100.0 fL   MCH 26.8 26.0 - 34.0 pg   MCHC 36.0 30.0 - 36.0 g/dL   RDW 14.713.8 82.911.5 - 56.215.5 %   Platelets 317 150 - 400 K/uL  Comprehensive metabolic panel     Status: Abnormal   Collection Time: 01/06/16  7:46 PM  Result Value Ref Range   Sodium 138 135 - 145 mmol/L    Potassium 3.2 (L) 3.5 - 5.1 mmol/L   Chloride 108 101 - 111 mmol/L   CO2 26 22 - 32 mmol/L   Glucose, Bld 104 (H) 65 - 99 mg/dL   BUN 17 6 - 20 mg/dL   Creatinine, Ser  0.69 0.44 - 1.00 mg/dL   Calcium 8.2 (L) 8.9 - 10.3 mg/dL   Total Protein 6.4 (L) 6.5 - 8.1 g/dL   Albumin 2.9 (L) 3.5 - 5.0 g/dL   AST 14 (L) 15 - 41 U/L   ALT 23 14 - 54 U/L   Alkaline Phosphatase 86 38 - 126 U/L   Total Bilirubin 0.9 0.3 - 1.2 mg/dL   GFR calc non Af Amer >60 >60 mL/min   GFR calc Af Amer >60 >60 mL/min   Anion gap 4 (L) 5 - 15    --/--/B POS (06/15 0200)  IMAGING CXR- bilateral lower lobe infiltrates c/w pneumonia (per prelim read).  Cardiology reviewed and reported that the changes were nonspecific    MAU Management/MDM: Ordered labs and imaging and reviewed results.  Consult Dr Erin Fulling.  CXR ordered with findings of possible pneumonia. Consult Cardiology, EKG is wnl for young pt per Dr Tresa Endo and CXR not enough information for treatment so he recommends BNP.  CTA, Troponin, and BNP ordered.  Treatments in MAU included GI Cocktail.  Dr Erin Fulling at bedside.    Medication List    ASK your doctor about these medications        albuterol 108 (90 Base) MCG/ACT inhaler  Commonly known as:  PROVENTIL HFA;VENTOLIN HFA  Inhale 1-2 puffs into the lungs every 6 (six) hours as needed for wheezing or shortness of breath. Reported on 12/19/2015     ibuprofen 600 MG tablet  Commonly known as:  ADVIL,MOTRIN  Take 1 tablet (600 mg total) by mouth every 6 (six) hours.     omeprazole 40 MG capsule  Commonly known as:  PRILOSEC  Take 40 mg by mouth daily.     PRENATAL COMPLETE 14-0.4 MG Tabs  Take 1 tablet by mouth daily.     ranitidine 150 MG capsule  Commonly known as:  ZANTAC  Take 1 capsule (150 mg total) by mouth 2 (two) times daily.      Postpartum preeclampsia with severe features.  BP's in the severe range despite IV antihypertensives. Clinical pulm edema.  Pt with normal ECG  but, given CP need to r/o MI  Admit for observation IV lasix  x 1 now CT to r/o PE Place foley cath Magnesium sulfate 6 gram load and 2 grams per hour  Procardia  XL   Troponin's per protocol   Caymen Dubray L. Harraway-Smith, M.D., [facility]

## 2016-01-06 NOTE — MAU Note (Signed)
4 liters O2 by nasal cannula applied

## 2016-01-06 NOTE — H&P (Signed)
Chief Complaint: HA, chest pressure and SOB postpartum   SUBJECTIVE HPI: Anna Walls is a 35 y.o. [id number] on PPD# 10 following NSVD who presents to maternity admissions reporting shortness of breath, chest pain, and migraine starting 3 days ago.  She is taking BC Powder for h/a for >24 hours with no resolution of the HA.  Pt reports that her sx are worse with lyin flat. She reports she feels like something is tight in her throat and it is hard to take a deep breath.  The chest pain is described as tightness and pressure from her throat down to her upper abdomen.  It is constant and unchanged.  She denies visual disturbances or n/v.  She is breastfeeding/pumping. She reports light lochia and denies abdominal pain, vaginal itching/burning, urinary symptoms, dizziness, or fever/chills.  This is her 5th pregnancy and she denies any prev PP problems.  Pt denies h/o SOB.     HPI  Past Medical History  Diagnosis Date  . Sickle cell trait (HCC)   . Low back pain   . Chronic headaches   . Hyperemesis    History reviewed. No pertinent past surgical history. Social History   Social History  . Marital Status: Married    Spouse Name: N/A  . Number of Children: N/A  . Years of Education: N/A   Occupational History  . Not on file.   Social History Main Topics  . Smoking status: Never Smoker   . Smokeless tobacco: Never Used  . Alcohol Use: No  . Drug Use: No  . Sexual Activity: Yes    Birth Control/ Protection: None   Other Topics Concern  . Not on file   Social History Narrative   No current facility-administered medications on file prior to encounter.   Current Outpatient Prescriptions on File Prior to Encounter  Medication Sig Dispense Refill  . albuterol (PROVENTIL HFA;VENTOLIN HFA) 108 (90 Base) MCG/ACT inhaler Inhale 1-2 puffs into the lungs every 6 (six) hours as needed for wheezing or shortness of breath. Reported on 12/19/2015    . ibuprofen (ADVIL,MOTRIN) 600 MG tablet  Take 1 tablet (600 mg total) by mouth every 6 (six) hours. 60 tablet 0  . omeprazole (PRILOSEC) 40 MG capsule Take 40 mg by mouth daily.    . Prenatal Vit-Fe Fumarate-FA (PRENATAL COMPLETE) 14-0.4 MG TABS Take 1 tablet by mouth daily. 30 each 6  . ranitidine (ZANTAC) 150 MG capsule Take 1 capsule (150 mg total) by mouth 2 (two) times daily. 60 capsule 0   No Known Allergies  ROS:  Review of Systems  Constitutional: Negative for fever, chills and fatigue.  HENT: Positive for sore throat. Negative for postnasal drip and rhinorrhea.   Eyes: Negative for visual disturbance.  Respiratory: Positive for cough and shortness of breath.   Cardiovascular: Positive for chest pain.  Gastrointestinal: Negative for nausea, vomiting and abdominal pain.  Genitourinary: Positive for vaginal bleeding. Negative for dysuria, flank pain, vaginal discharge, difficulty urinating, vaginal pain and pelvic pain.  Neurological: Positive for headaches. Negative for dizziness.  Psychiatric/Behavioral: Negative.      I have reviewed patient's Past Medical Hx, Surgical Hx, Family Hx, Social Hx, medications and allergies.   Physical Exam  Patient Vitals for the past 24 hrs:  BP Temp Pulse Resp SpO2 Height Weight  01/06/16 2004 - - 80 - 98 % - -  01/06/16 2002 153/94 mmHg - 76 - - - -  01/06/16 1959 - - 75 - 97 % - -  01/06/16 1957 - - 78 - 97 % - -  01/06/16 1952 - - 76 - 95 % - -  01/06/16 1947 163/89 mmHg - 76 - 94 % - -  01/06/16 1946 - - 76 - - - -  01/06/16 1942 - - 83 - 94 % - -  01/06/16 1941 - - 83 - 94 % - -  01/06/16 1937 - - 78 - 93 % - -  01/06/16 1932 151/92 mmHg - 75 - 93 % - -  01/06/16 1931 - - 75 - - - -  01/06/16 1919 149/95 mmHg 98.5 F (36.9 C) 80 24 93 % 5\' 4"  (1.626 m) 207 lb (93.895 kg)   Constitutional: Well-developed, well-nourished female in no acute distress. Pt sitting upright and panting with breathing HEART: normal rate, heart sounds, regular rhythm RESP:  lung sounds clear  and equal bilaterally but diminished in lower lobes, coughing with deep breathing GI: Abd soft, non-tender. Pos BS x 4. Fundus: below umbilicus MS: Extremities nontender, 3+pitting edema; normal ROM Neurologic: Alert and oriented x 4.  GU: Neg CVAT. PELVIC EXAM: N/A   LAB RESULTS Results for orders placed or performed during the hospital encounter of 01/06/16 (from the past 24 hour(s))  Protein / creatinine ratio, urine     Status: Abnormal   Collection Time: 01/06/16  7:17 PM  Result Value Ref Range   Creatinine, Urine 117.00 mg/dL   Total Protein, Urine 26 mg/dL   Protein Creatinine Ratio 0.22 (H) 0.00 - 0.15 mg/mg[Cre]  Urinalysis, Routine w reflex microscopic (not [facility])     Status: Abnormal   Collection Time: 01/06/16  7:17 PM  Result Value Ref Range   Color, Urine YELLOW YELLOW   APPearance CLEAR CLEAR   Specific Gravity, Urine 1.010 1.005 - 1.030   pH 6.0 5.0 - 8.0   Glucose, UA NEGATIVE NEGATIVE mg/dL   Hgb urine dipstick MODERATE (A) NEGATIVE   Bilirubin Urine NEGATIVE NEGATIVE   Ketones, ur NEGATIVE NEGATIVE mg/dL   Protein, ur NEGATIVE NEGATIVE mg/dL   Nitrite NEGATIVE NEGATIVE   Leukocytes, UA TRACE (A) NEGATIVE  Urine microscopic-add on     Status: Abnormal   Collection Time: 01/06/16  7:17 PM  Result Value Ref Range   Squamous Epithelial / LPF 0-5 (A) NONE SEEN   WBC, UA 0-5 0 - 5 WBC/hpf   RBC / HPF 6-30 0 - 5 RBC/hpf   Bacteria, UA FEW (A) NONE SEEN  CBC     Status: Abnormal   Collection Time: 01/06/16  7:46 PM  Result Value Ref Range   WBC 7.4 4.0 - 10.5 K/uL   RBC 3.66 (L) 3.87 - 5.11 MIL/uL   Hemoglobin 9.8 (L) 12.0 - 15.0 g/dL   HCT 45.427.2 (L) 09.836.0 - 11.946.0 %   MCV 74.3 (L) 78.0 - 100.0 fL   MCH 26.8 26.0 - 34.0 pg   MCHC 36.0 30.0 - 36.0 g/dL   RDW 14.713.8 82.911.5 - 56.215.5 %   Platelets 317 150 - 400 K/uL  Comprehensive metabolic panel     Status: Abnormal   Collection Time: 01/06/16  7:46 PM  Result Value Ref Range   Sodium 138 135 - 145 mmol/L    Potassium 3.2 (L) 3.5 - 5.1 mmol/L   Chloride 108 101 - 111 mmol/L   CO2 26 22 - 32 mmol/L   Glucose, Bld 104 (H) 65 - 99 mg/dL   BUN 17 6 - 20 mg/dL   Creatinine, Ser  0.69 0.44 - 1.00 mg/dL   Calcium 8.2 (L) 8.9 - 10.3 mg/dL   Total Protein 6.4 (L) 6.5 - 8.1 g/dL   Albumin 2.9 (L) 3.5 - 5.0 g/dL   AST 14 (L) 15 - 41 U/L   ALT 23 14 - 54 U/L   Alkaline Phosphatase 86 38 - 126 U/L   Total Bilirubin 0.9 0.3 - 1.2 mg/dL   GFR calc non Af Amer >60 >60 mL/min   GFR calc Af Amer >60 >60 mL/min   Anion gap 4 (L) 5 - 15    --/--/B POS (06/15 0200)  IMAGING CXR- bilateral lower lobe infiltrates c/w pneumonia (per prelim read).  Cardiology reviewed and reported that the changes were nonspecific    MAU Management/MDM: Ordered labs and imaging and reviewed results.  Consult Dr Erin FullingHarraway-Smith.  CXR ordered with findings of possible pneumonia. Consult Cardiology, EKG is wnl for young pt per Dr Tresa EndoKelly and CXR not enough information for treatment so he recommends BNP.  CTA, Troponin, and BNP ordered.  Treatments in MAU included GI Cocktail.  Dr Erin FullingHarraway-Smith at bedside.    Medication List    ASK your doctor about these medications        albuterol 108 (90 Base) MCG/ACT inhaler  Commonly known as:  PROVENTIL HFA;VENTOLIN HFA  Inhale 1-2 puffs into the lungs every 6 (six) hours as needed for wheezing or shortness of breath. Reported on 12/19/2015     ibuprofen 600 MG tablet  Commonly known as:  ADVIL,MOTRIN  Take 1 tablet (600 mg total) by mouth every 6 (six) hours.     omeprazole 40 MG capsule  Commonly known as:  PRILOSEC  Take 40 mg by mouth daily.     PRENATAL COMPLETE 14-0.4 MG Tabs  Take 1 tablet by mouth daily.     ranitidine 150 MG capsule  Commonly known as:  ZANTAC  Take 1 capsule (150 mg total) by mouth 2 (two) times daily.      Postpartum preeclampsia with severe features.  BP's in the severe range despite IV antihypertensives. Clinical pulm edema.  Pt with normal ECG  but, given CP need to r/o MI  Admit for observation IV lasix 80mg  x 1 now CT to r/o PE Place foley cath Magnesium sulfate 6 gram load and 2 grams per hour  Troponin's per protocol   Casidy Alberta L. Harraway-Smith, M.D., [facility]

## 2016-01-06 NOTE — MAU Note (Signed)
Pt is s/p vaginal delivery 06/15, pt states her feet have continued to swell and she is having pain in her upper chest and gets short of breath with any walking. States she has been having headaches and [facility] and she has stated iron.

## 2016-01-07 ENCOUNTER — Encounter (HOSPITAL_COMMUNITY): Payer: Self-pay | Admitting: General Practice

## 2016-01-07 ENCOUNTER — Inpatient Hospital Stay (HOSPITAL_BASED_OUTPATIENT_CLINIC_OR_DEPARTMENT_OTHER): Payer: Medicaid Other

## 2016-01-07 DIAGNOSIS — R0602 Shortness of breath: Secondary | ICD-10-CM | POA: Diagnosis present

## 2016-01-07 DIAGNOSIS — I309 Acute pericarditis, unspecified: Secondary | ICD-10-CM | POA: Insufficient documentation

## 2016-01-07 DIAGNOSIS — J810 Acute pulmonary edema: Secondary | ICD-10-CM | POA: Diagnosis present

## 2016-01-07 DIAGNOSIS — O1495 Unspecified pre-eclampsia, complicating the puerperium: Secondary | ICD-10-CM | POA: Diagnosis not present

## 2016-01-07 DIAGNOSIS — R7989 Other specified abnormal findings of blood chemistry: Secondary | ICD-10-CM | POA: Diagnosis not present

## 2016-01-07 DIAGNOSIS — R079 Chest pain, unspecified: Secondary | ICD-10-CM | POA: Insufficient documentation

## 2016-01-07 DIAGNOSIS — I509 Heart failure, unspecified: Secondary | ICD-10-CM | POA: Diagnosis not present

## 2016-01-07 DIAGNOSIS — I214 Non-ST elevation (NSTEMI) myocardial infarction: Secondary | ICD-10-CM | POA: Diagnosis not present

## 2016-01-07 LAB — COMPREHENSIVE METABOLIC PANEL
ALBUMIN: 3.2 g/dL — AB (ref 3.5–5.0)
ALK PHOS: 97 U/L (ref 38–126)
ALT: 26 U/L (ref 14–54)
ALT: 24 U/L (ref 14–54)
AST: 18 U/L (ref 15–41)
AST: 18 U/L (ref 15–41)
Albumin: 3 g/dL — ABNORMAL LOW (ref 3.5–5.0)
Alkaline Phosphatase: 103 U/L (ref 38–126)
Anion gap: 11 (ref 5–15)
Anion gap: 11 (ref 5–15)
BILIRUBIN TOTAL: 0.4 mg/dL (ref 0.3–1.2)
BUN: 15 mg/dL (ref 6–20)
BUN: 12 mg/dL (ref 6–20)
CALCIUM: 7.3 mg/dL — AB (ref 8.9–10.3)
CHLORIDE: 99 mmol/L — AB (ref 101–111)
CO2: 27 mmol/L (ref 22–32)
CO2: 26 mmol/L (ref 22–32)
CREATININE: 0.76 mg/dL (ref 0.44–1.00)
CREATININE: 0.67 mg/dL (ref 0.44–1.00)
Calcium: 8.1 mg/dL — ABNORMAL LOW (ref 8.9–10.3)
Chloride: 101 mmol/L (ref 101–111)
GFR calc Af Amer: >60 mL/min (ref >60)
GFR calc Af Amer: >60 mL/min (ref >60)
GFR calc non Af Amer: >60 mL/min (ref >60)
GLUCOSE: 249 mg/dL — AB (ref 65–99)
GLUCOSE: 195 mg/dL — AB (ref 65–99)
POTASSIUM: 3.1 mmol/L — AB (ref 3.5–5.1)
Potassium: 3.2 mmol/L — ABNORMAL LOW (ref 3.5–5.1)
Sodium: 137 mmol/L (ref 135–145)
Sodium: 138 mmol/L (ref 135–145)
TOTAL PROTEIN: 7 g/dL (ref 6.5–8.1)
Total Bilirubin: 0.7 mg/dL (ref 0.3–1.2)
Total Protein: 7.3 g/dL (ref 6.5–8.1)

## 2016-01-07 LAB — BRAIN NATRIURETIC PEPTIDE: B Natriuretic Peptide: 311.2 pg/mL — ABNORMAL HIGH (ref 0.0–100.0)

## 2016-01-07 LAB — CBC
HEMATOCRIT: 29.8 % — AB (ref 36.0–46.0)
HEMOGLOBIN: 10.6 g/dL — AB (ref 12.0–15.0)
MCH: 26.4 pg (ref 26.0–34.0)
MCHC: 35.6 g/dL (ref 30.0–36.0)
MCV: 74.1 fL — AB (ref 78.0–100.0)
Platelets: 368 10*3/uL (ref 150–400)
RBC: 4.02 MIL/uL (ref 3.87–5.11)
RDW: 13.8 % (ref 11.5–15.5)
WBC: 6.5 10*3/uL (ref 4.0–10.5)

## 2016-01-07 LAB — ECHOCARDIOGRAM COMPLETE
E decel time: 195 msec
E/e' ratio: 7.86
FS: 47 % — AB (ref 28–44)
HEIGHTINCHES: 64 in
IVS/LV PW RATIO, ED: 0.94
LA diam index: 1.76 cm/m2
LA vol index: 18.9 mL/m2
LA vol: 37.7 mL
LASIZE: 35 mm
LAVOLA4C: 44.5 mL
LDCA: 3.14 cm2
LEFT ATRIUM END SYS DIAM: 35 mm
LV E/e' medial: 7.86
LV E/e'average: 7.86
LV TDI E'LATERAL: 13.1
LV e' LATERAL: 13.1 cm/s
LVOT diameter: 20 mm
MV Dec: 195
MV pk A vel: 88.3 m/s
MVPG: 4 mmHg
MVPKEVEL: 103 m/s
PW: 11.8 mm — AB (ref 0.6–1.1)
TDI e' medial: 11.6
WEIGHTICAEL: 3312 [oz_av]

## 2016-01-07 LAB — TROPONIN I: Troponin I: <0.03 ng/mL (ref <0.031)

## 2016-01-07 LAB — C-REACTIVE PROTEIN: CRP: 8.2 mg/dL — AB (ref <1.0)

## 2016-01-07 MED ORDER — FUROSEMIDE 10 MG/ML IJ SOLN
80.0000 mg | Freq: Once | INTRAMUSCULAR | Status: AC
  Administered 2016-01-07: 80 mg via INTRAVENOUS
  Filled 2016-01-07: qty 8

## 2016-01-07 MED ORDER — COLCHICINE 0.6 MG PO TABS
1.2000 mg | ORAL_TABLET | Freq: Once | ORAL | Status: AC
  Administered 2016-01-07: 1.2 mg via ORAL
  Filled 2016-01-07: qty 2

## 2016-01-07 MED ORDER — IBUPROFEN 800 MG PO TABS
800.0000 mg | ORAL_TABLET | Freq: Three times a day (TID) | ORAL | Status: DC
  Administered 2016-01-07 – 2016-01-11 (×12): 800 mg via ORAL
  Filled 2016-01-07 (×12): qty 1

## 2016-01-07 MED ORDER — ENOXAPARIN SODIUM 100 MG/ML ~~LOC~~ SOLN
1.0000 mg/kg | Freq: Two times a day (BID) | SUBCUTANEOUS | Status: DC
  Administered 2016-01-07: 95 mg via SUBCUTANEOUS
  Filled 2016-01-07 (×3): qty 1

## 2016-01-07 MED ORDER — POTASSIUM CHLORIDE CRYS ER 20 MEQ PO TBCR
40.0000 meq | EXTENDED_RELEASE_TABLET | Freq: Three times a day (TID) | ORAL | Status: AC
  Administered 2016-01-07 – 2016-01-09 (×5): 40 meq via ORAL
  Filled 2016-01-07 (×5): qty 2

## 2016-01-07 MED ORDER — ENOXAPARIN SODIUM 60 MG/0.6ML ~~LOC~~ SOLN
50.0000 mg | Freq: Once | SUBCUTANEOUS | Status: AC
  Administered 2016-01-07: 02:00:00 via SUBCUTANEOUS
  Filled 2016-01-07: qty 0.6

## 2016-01-07 MED ORDER — OXYCODONE HCL 5 MG PO TABS
5.0000 mg | ORAL_TABLET | Freq: Four times a day (QID) | ORAL | Status: DC | PRN
  Administered 2016-01-07 – 2016-01-08 (×3): 10 mg via ORAL
  Administered 2016-01-08: 5 mg via ORAL
  Filled 2016-01-07 (×3): qty 2
  Filled 2016-01-07 (×2): qty 1

## 2016-01-07 MED ORDER — POTASSIUM CHLORIDE CRYS ER 20 MEQ PO TBCR
40.0000 meq | EXTENDED_RELEASE_TABLET | Freq: Two times a day (BID) | ORAL | Status: DC
  Administered 2016-01-07 (×2): 40 meq via ORAL
  Filled 2016-01-07 (×4): qty 2

## 2016-01-07 MED ORDER — ENOXAPARIN SODIUM 100 MG/ML ~~LOC~~ SOLN
1.0000 mg/kg | Freq: Two times a day (BID) | SUBCUTANEOUS | Status: DC
  Filled 2016-01-07: qty 1

## 2016-01-07 MED ORDER — BUTALBITAL-APAP-CAFFEINE 50-325-40 MG PO TABS
2.0000 | ORAL_TABLET | Freq: Four times a day (QID) | ORAL | Status: DC | PRN
  Administered 2016-01-07 – 2016-01-08 (×2): 2 via ORAL
  Filled 2016-01-07 (×2): qty 2

## 2016-01-07 MED ORDER — POTASSIUM CHLORIDE CRYS ER 20 MEQ PO TBCR: 40.0000 meq | EXTENDED_RELEASE_TABLET | Freq: Three times a day (TID) | ORAL | Status: DC

## 2016-01-07 MED ORDER — DOCUSATE SODIUM 100 MG PO CAPS
100.0000 mg | ORAL_CAPSULE | Freq: Two times a day (BID) | ORAL | Status: DC | PRN
  Administered 2016-01-07: 100 mg via ORAL
  Filled 2016-01-07: qty 1

## 2016-01-07 MED ORDER — OXYCODONE HCL 5 MG PO TABS: 10.0000 mg | ORAL_TABLET | Freq: Once | ORAL | Status: DC

## 2016-01-07 MED ORDER — FUROSEMIDE 40 MG PO TABS
40.0000 mg | ORAL_TABLET | Freq: Every day | ORAL | Status: DC | PRN
  Filled 2016-01-07: qty 1

## 2016-01-07 MED ORDER — ENOXAPARIN SODIUM 40 MG/0.4ML ~~LOC~~ SOLN
40.0000 mg | Freq: Once | SUBCUTANEOUS | Status: AC
  Administered 2016-01-08: 40 mg via SUBCUTANEOUS
  Filled 2016-01-07: qty 0.4

## 2016-01-07 MED ORDER — COLCHICINE 0.6 MG PO TABS
0.6000 mg | ORAL_TABLET | Freq: Every day | ORAL | Status: DC
  Administered 2016-01-07 – 2016-01-11 (×5): 0.6 mg via ORAL
  Filled 2016-01-07 (×6): qty 1

## 2016-01-07 NOTE — Progress Notes (Addendum)
PATIENT ID: Anna Walls is a 55 G70P004 female with no significant PMH here with chest pain and volume overload.  SUBJECTIVE:  Breathing is much better.  She reports a 7/10 headache.  She also continues to have pleuritic chest pain when laying on her back, sitting forward or laying on her side.     PHYSICAL EXAM Filed Vitals:   01/07/16 1310 01/07/16 1315 01/07/16 1320 01/07/16 1403  BP:    101/61  Pulse: 105 103 104 103  Temp:      TempSrc:      Resp:    18  Height:      Weight:      SpO2: 93% 95% 94% 94%   General:  Well-appearing.  No acute distress.  Neck: No JVD Lungs:  CTAB.  No crackles, rhonchi or wheezes.  Heart:  Tachycardic.  Regular rhythm.  No m/r/g.  Normal S1/S2.  Abdomen:  Soft, NT, ND.  +BS Extremities:  WWP.  1+ pitting edema to the ankles.   LABS: Lab Results  Component Value Date   TROPONINI <0.03 01/07/2016   Results for orders placed or performed during the hospital encounter of 01/06/16 (from the past 24 hour(s))  Protein / creatinine ratio, urine     Status: Abnormal   Collection Time: 01/06/16  7:17 PM  Result Value Ref Range   Creatinine, Urine 117.00 mg/dL   Total Protein, Urine 26 mg/dL   Protein Creatinine Ratio 0.22 (H) 0.00 - 0.15 mg/mg[Cre]  Urinalysis, Routine w reflex microscopic (not at [facility])     Status: Abnormal   Collection Time: 01/06/16  7:17 PM  Result Value Ref Range   Color, Urine YELLOW YELLOW   APPearance CLEAR CLEAR   Specific Gravity, Urine 1.010 1.005 - 1.030   pH 6.0 5.0 - 8.0   Glucose, UA NEGATIVE NEGATIVE mg/dL   Hgb urine dipstick MODERATE (A) NEGATIVE   Bilirubin Urine NEGATIVE NEGATIVE   Ketones, ur NEGATIVE NEGATIVE mg/dL   Protein, ur NEGATIVE NEGATIVE mg/dL   Nitrite NEGATIVE NEGATIVE   Leukocytes, UA TRACE (A) NEGATIVE  Urine microscopic-add on     Status: Abnormal   Collection Time: 01/06/16  7:17 PM  Result Value Ref Range   Squamous Epithelial / LPF 0-5 (A) NONE SEEN   WBC, UA 0-5 0 - 5 WBC/hpf   RBC / HPF 6-30 0 - 5 RBC/hpf   Bacteria, UA FEW (A) NONE SEEN  CBC     Status: Abnormal   Collection Time: 01/06/16  7:46 PM  Result Value Ref Range   WBC 7.4 4.0 - 10.5 K/uL   RBC 3.66 (L) 3.87 - 5.11 MIL/uL   Hemoglobin 9.8 (L) 12.0 - 15.0 g/dL   HCT 16.1 (L) 09.6 - 04.5 %   MCV 74.3 (L) 78.0 - 100.0 fL   MCH 26.8 26.0 - 34.0 pg   MCHC 36.0 30.0 - 36.0 g/dL   RDW 40.9 81.1 - 91.4 %   Platelets 317 150 - 400 K/uL  Comprehensive metabolic panel     Status: Abnormal   Collection Time: 01/06/16  7:46 PM  Result Value Ref Range   Sodium 138 135 - 145 mmol/L   Potassium 3.2 (L) 3.5 - 5.1 mmol/L   Chloride 108 101 - 111 mmol/L   CO2 26 22 - 32 mmol/L   Glucose, Bld 104 (H) 65 - 99 mg/dL   BUN 17 6 - 20 mg/dL   Creatinine, Ser 7.82 0.44 - 1.00 mg/dL  Calcium 8.2 (L) 8.9 - 10.3 mg/dL   Total Protein 6.4 (L) 6.5 - 8.1 g/dL   Albumin 2.9 (L) 3.5 - 5.0 g/dL   AST 14 (L) 15 - 41 U/L   ALT 23 14 - 54 U/L   Alkaline Phosphatase 86 38 - 126 U/L   Total Bilirubin 0.9 0.3 - 1.2 mg/dL   GFR calc non Af Amer >60 >60 mL/min   GFR calc Af Amer >60 >60 mL/min   Anion gap 4 (L) 5 - 15  Brain natriuretic peptide     Status: Abnormal   Collection Time: 01/06/16  7:46 PM  Result Value Ref Range   B Natriuretic Peptide 311.2 (H) 0.0 - 100.0 pg/mL  Troponin I (q 6hr x 3)     Status: None   Collection Time: 01/06/16 10:29 PM  Result Value Ref Range   Troponin I <0.03 <0.031 ng/mL  Type and screen [facility]     Status: None   Collection Time: 01/06/16 10:29 PM  Result Value Ref Range   ABO/RH(D) B POS    Antibody Screen NEG    Sample Expiration 01/09/2016   CBC     Status: Abnormal   Collection Time: 01/06/16 10:29 PM  Result Value Ref Range   WBC 8.4 4.0 - 10.5 K/uL   RBC 3.59 (L) 3.87 - 5.11 MIL/uL   Hemoglobin 9.5 (L) 12.0 - 15.0 g/dL   HCT 16.126.8 (L) 09.636.0 - 04.546.0 %   MCV 74.7 (L) 78.0 - 100.0 fL   MCH 26.5 26.0 - 34.0 pg   MCHC 35.4 30.0 - 36.0 g/dL   RDW 40.913.8 81.111.5  - 91.415.5 %   Platelets 340 150 - 400 K/uL  Creatinine, serum     Status: None   Collection Time: 01/06/16 10:29 PM  Result Value Ref Range   Creatinine, Ser 0.67 0.44 - 1.00 mg/dL   GFR calc non Af Amer >60 >60 mL/min   GFR calc Af Amer >60 >60 mL/min  Troponin I (q 6hr x 3)     Status: None   Collection Time: 01/07/16  4:05 AM  Result Value Ref Range   Troponin I <0.03 <0.031 ng/mL  Comprehensive metabolic panel     Status: Abnormal   Collection Time: 01/07/16  4:05 AM  Result Value Ref Range   Sodium 137 135 - 145 mmol/L   Potassium 3.1 (L) 3.5 - 5.1 mmol/L   Chloride 99 (L) 101 - 111 mmol/L   CO2 27 22 - 32 mmol/L   Glucose, Bld 249 (H) 65 - 99 mg/dL   BUN 15 6 - 20 mg/dL   Creatinine, Ser 7.820.76 0.44 - 1.00 mg/dL   Calcium 8.1 (L) 8.9 - 10.3 mg/dL   Total Protein 7.3 6.5 - 8.1 g/dL   Albumin 3.2 (L) 3.5 - 5.0 g/dL   AST 18 15 - 41 U/L   ALT 26 14 - 54 U/L   Alkaline Phosphatase 103 38 - 126 U/L   Total Bilirubin 0.7 0.3 - 1.2 mg/dL   GFR calc non Af Amer >60 >60 mL/min   GFR calc Af Amer >60 >60 mL/min   Anion gap 11 5 - 15  Troponin I     Status: None   Collection Time: 01/07/16 10:17 AM  Result Value Ref Range   Troponin I <0.03 <0.031 ng/mL  CBC     Status: Abnormal   Collection Time: 01/07/16 11:56 AM  Result Value Ref Range  WBC 6.5 4.0 - 10.5 K/uL   RBC 4.02 3.87 - 5.11 MIL/uL   Hemoglobin 10.6 (L) 12.0 - 15.0 g/dL   HCT 86.529.8 (L) 78.436.0 - 69.646.0 %   MCV 74.1 (L) 78.0 - 100.0 fL   MCH 26.4 26.0 - 34.0 pg   MCHC 35.6 30.0 - 36.0 g/dL   RDW 29.513.8 28.411.5 - 13.215.5 %   Platelets 368 150 - 400 K/uL  Comprehensive metabolic panel     Status: Abnormal   Collection Time: 01/07/16 11:56 AM  Result Value Ref Range   Sodium 138 135 - 145 mmol/L   Potassium 3.2 (L) 3.5 - 5.1 mmol/L   Chloride 101 101 - 111 mmol/L   CO2 26 22 - 32 mmol/L   Glucose, Bld 195 (H) 65 - 99 mg/dL   BUN 12 6 - 20 mg/dL   Creatinine, Ser 4.400.67 0.44 - 1.00 mg/dL   Calcium 7.3 (L) 8.9 - 10.3 mg/dL    Total Protein 7.0 6.5 - 8.1 g/dL   Albumin 3.0 (L) 3.5 - 5.0 g/dL   AST 18 15 - 41 U/L   ALT 24 14 - 54 U/L   Alkaline Phosphatase 97 38 - 126 U/L   Total Bilirubin 0.4 0.3 - 1.2 mg/dL   GFR calc non Af Amer >60 >60 mL/min   GFR calc Af Amer >60 >60 mL/min   Anion gap 11 5 - 15    Intake/Output Summary (Last 24 hours) at 01/07/16 1459 Last data filed at 01/07/16 1400  Gross per 24 hour  Intake   2555 ml  Output   7355 ml  Net  -4800 ml   EKG: Sinus rhythm rate 75 bpm.    ASSESSMENT AND PLAN:  Active Problems:   Preeclampsia in postpartum period   Postoperative pulmonary edema (HCC)   # Chest pain:  # Shortness of breath:  Cardiac enzymes are negative and EKG is unremarkable.  Her echo shows normal systolic and diastolic function without any valvular heart disease.  She does have a small pericardial effusion but there is no evidence of tamponade.  I suspect that she may have pericarditis given her report of pleuritic chest pain that is worse when sitting forward, laying down or laying on her side.  CT-A was negative for PE.  We will check a CRP and start ibuprofen 800mg  tid as well as colchicine 1.2 mg po x1 followed by 0.6mg  1 hour later.  She will then take colchicine 0.6mg  daily until she follows up in cardiology clinic.  We will arrange follow up.  Will switch lasix to 40 mg po daily as needed for lower extremity edema or weight gain > 2lb in one day.  We will also stop lovenox given her negative CT and cardiac enzymes.   # Hypertension: BP well-controlled on nifedipine.  It was initially elevated on admission.  Continue procardia.   Meghanne Pletz C. Duke Salviaandolph, MD, [facility] 01/07/2016 2:59 PM

## 2016-01-07 NOTE — Progress Notes (Signed)
OB note Patient states she feels better than on admission. Still with some substernal reproducible with deep breath and palpation chest discomfort  Patient AF VS normal and stable and just mildly tachy. UOP:75-14200mL NAD with [place] on CTAB, no respiratory distress Normal s1 and s2, no MRGs, slightly tachy into the 100s 1+brachial b/l GU: 25mL UOP (clear) in foley bag  A/p: pt stable -I spoke to cards and they will come by and see her today -follow up echo read -repeat HELLP surveillance labs now -cont Mg for 24hrs ([phone] tonight)  Cornelia Copaharlie Adric Wrede, Jr MD Attending Center for [facility](Faculty Practice)

## 2016-01-07 NOTE — Progress Notes (Signed)
Post Partum Day 11 Subjective: Pt reports continued chest pressure and SOB that is improved since admission.  Her HA is better but, still present.     Objective: Blood pressure 127/80, pulse 93, temperature 98.7 F (37.1 C), temperature source Oral, resp. rate 20, height 5\' 4"  (1.626 m), weight 207 lb (93.895 kg), SpO2 98 %, unknown if currently breastfeeding.  Physical Exam:  General: alert and mild distress;  Lungs: still with decreased breath sounds at base but, improved air movement from prev exam CV: RRR no murmers Ext: 2+ pitting edema- symmetric  Results for orders placed or performed during the hospital encounter of 01/06/16 (from the past 24 hour(s))  Protein / creatinine ratio, urine     Status: Abnormal   Collection Time: 01/06/16  7:17 PM  Result Value Ref Range   Creatinine, Urine 117.00 mg/dL   Total Protein, Urine 26 mg/dL   Protein Creatinine Ratio 0.22 (H) 0.00 - 0.15 mg/mg[Cre]  Urinalysis, Routine w reflex microscopic [facility])     Status: Abnormal   Collection Time: 01/06/16  7:17 PM  Result Value Ref Range   Color, Urine YELLOW YELLOW   APPearance CLEAR CLEAR   Specific Gravity, Urine 1.010 1.005 - 1.030   pH 6.0 5.0 - 8.0   Glucose, UA NEGATIVE NEGATIVE mg/dL   Hgb urine dipstick MODERATE (A) NEGATIVE   Bilirubin Urine NEGATIVE NEGATIVE   Ketones, ur NEGATIVE NEGATIVE mg/dL   Protein, ur NEGATIVE NEGATIVE mg/dL   Nitrite NEGATIVE NEGATIVE   Leukocytes, UA TRACE (A) NEGATIVE  Urine microscopic-add on     Status: Abnormal   Collection Time: 01/06/16  7:17 PM  Result Value Ref Range   Squamous Epithelial / LPF 0-5 (A) NONE SEEN   WBC, UA 0-5 0 - 5 WBC/hpf   RBC / HPF 6-30 0 - 5 RBC/hpf   Bacteria, UA FEW (A) NONE SEEN  CBC     Status: Abnormal   Collection Time: 01/06/16  7:46 PM  Result Value Ref Range   WBC 7.4 4.0 - 10.5 K/uL   RBC 3.66 (L) 3.87 - 5.11 MIL/uL   Hemoglobin 9.8 (L) 12.0 - 15.0 g/dL   HCT 04.527.2 (L) 40.936.0 - 81.146.0 %   MCV 74.3 (L)  78.0 - 100.0 fL   MCH 26.8 26.0 - 34.0 pg   MCHC 36.0 30.0 - 36.0 g/dL   RDW 91.413.8 78.211.5 - 95.615.5 %   Platelets 317 150 - 400 K/uL  Comprehensive metabolic panel     Status: Abnormal   Collection Time: 01/06/16  7:46 PM  Result Value Ref Range   Sodium 138 135 - 145 mmol/L   Potassium 3.2 (L) 3.5 - 5.1 mmol/L   Chloride 108 101 - 111 mmol/L   CO2 26 22 - 32 mmol/L   Glucose, Bld 104 (H) 65 - 99 mg/dL   BUN 17 6 - 20 mg/dL   Creatinine, Ser 2.130.69 0.44 - 1.00 mg/dL   Calcium 8.2 (L) 8.9 - 10.3 mg/dL   Total Protein 6.4 (L) 6.5 - 8.1 g/dL   Albumin 2.9 (L) 3.5 - 5.0 g/dL   AST 14 (L) 15 - 41 U/L   ALT 23 14 - 54 U/L   Alkaline Phosphatase 86 38 - 126 U/L   Total Bilirubin 0.9 0.3 - 1.2 mg/dL   GFR calc non Af Amer >60 >60 mL/min   GFR calc Af Amer >60 >60 mL/min   Anion gap 4 (L) 5 - 15  Brain  natriuretic peptide     Status: Abnormal   Collection Time: 01/06/16  7:46 PM  Result Value Ref Range   B Natriuretic Peptide 311.2 (H) 0.0 - 100.0 pg/mL  Troponin I (q 6hr x 3)     Status: Abnormal   Collection Time: 01/06/16 10:29 PM  Result Value Ref Range   Troponin I 1.26 (HH) <0.031 ng/mL  Type and screen [facility]     Status: None   Collection Time: 01/06/16 10:29 PM  Result Value Ref Range   ABO/RH(D) B POS    Antibody Screen NEG    Sample Expiration 01/09/2016   CBC     Status: Abnormal   Collection Time: 01/06/16 10:29 PM  Result Value Ref Range   WBC 8.4 4.0 - 10.5 K/uL   RBC 3.59 (L) 3.87 - 5.11 MIL/uL   Hemoglobin 9.5 (L) 12.0 - 15.0 g/dL   HCT 16.1 (L) 09.6 - 04.5 %   MCV 74.7 (L) 78.0 - 100.0 fL   MCH 26.5 26.0 - 34.0 pg   MCHC 35.4 30.0 - 36.0 g/dL   RDW 40.9 81.1 - 91.4 %   Platelets 340 150 - 400 K/uL  Creatinine, serum     Status: None   Collection Time: 01/06/16 10:29 PM  Result Value Ref Range   Creatinine, Ser 0.67 0.44 - 1.00 mg/dL   GFR calc non Af Amer >60 >60 mL/min   GFR calc Af Amer >60 >60 mL/min     Recent Labs   01/06/16 1946 01/06/16 2229  HGB 9.8* 9.5*  HCT 27.2* 26.8*   01/06/2016 CLINICAL DATA: Severe dyspnea and headache for 3 days  EXAM: CT ANGIOGRAPHY CHEST WITH CONTRAST  TECHNIQUE: Multidetector CT imaging of the chest was performed using the standard protocol during bolus administration of intravenous contrast. Multiplanar CT image reconstructions and MIPs were obtained to evaluate the vascular anatomy.  CONTRAST: 100 mL Isovue 370 intravenous  COMPARISON: Radiographs 01/06/2016  FINDINGS: Cardiovascular: There is good opacification of the pulmonary arteries. There is no pulmonary embolism. The thoracic aorta is normal in caliber and intact.  Lungs: Airspace opacities in the central right upper lobe, central right lower lobe, left lower lobe. Milder involvement in the left upper lobe with a few scattered patchy opacities. This may represent hemorrhage, infectious infiltrate, edema.  Central airways: Patent  Effusions: Small to moderate right pleural effusion. Very small left pleural effusion.  Lymphadenopathy: There are a few mildly prominent mediastinal nodes. These are more likely reactive.  Esophagus: Unremarkable  Upper abdomen: No significant abnormality  Musculoskeletal: No significant abnormality  Review of the MIP images confirms the above findings.  IMPRESSION: 1. Negative for pulmonary embolism. 2. Extensive airspace opacities involving much of the right lung and left lower lobe with relative sparing of the periphery. This may be infectious, but hemorrhage or edema is also possible. Pleural effusions are present, right greater than left.  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  BMP elevated (some elevation expected in the peripartum period).  Elevated troponin.  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 D/W Dr. Tresa Endo of cardiology who recommends complete ECHO in the am with full anticoagulation.  He will officially consult on pt.  Hypokalemia  Lasix  IV x1 now Lovenox /kg Complete ECHO Cardiology consult called Keep Magnesim sulfate Monitor accurate Intake KCL  bid x4 doses Repeat BMP in am  O2 to keep sats >95%    Keep Procardia 30mg XL bid       LOS: 0 days   HARRAWAY-SMITH, Jaccob Czaplicki 01/07/2016, 2:28 AM

## 2016-01-07 NOTE — Progress Notes (Signed)
Subjective: Patient reports tolerating PO.  Pt reports marked decrease in her SOB. Chest pain resolved. HA still present but, improved.  Objective: I have reviewed patient's vital signs, intake and output, medications and labs. BP 117/73 mmHg  Pulse 109  Temp(Src) 98.9 F (37.2 C) (Axillary)  Resp 18  Ht 5\' 4"  (1.626 m)  Wt 207 lb (93.895 kg)  BMI 35.51 kg/m2  SpO2 93%   General: alert and no distress Resp: clear to auscultation bilaterally Cardio: regular rate and rhythm, S1, S2 normal, no murmur, click, rub or gallop Extremities: extremities normal, atraumatic, no cyanosis or edema  Assessment/Plan: Postpartum preeclampsia with severe features including pulm edema vs postpartum/peripartum cardiomyopathy- Pt much imporved clinically after lasix but ,still with some SOB and HA. BMP elevated (some elevation expected in the peripartum period). BP's improved.   Still with O2 requirement.  Initially reports elevated troponin was reread as INCORRECT.  Actual level is WNL. ECHO is being done presently  Hypokalemia  Lovenox 1mg /kg  Complete ECHO Keep Magnesim sulfate Monitor accurate Intake KCL 40mg  bid x4 doses Repeat BMP in am O2 to keep sats >95%. Wean as tol  Keep Procardia 30mg XL bid  LOS: 0 days    Walls, Anna Pogue 01/07/2016, 7:36 AM

## 2016-01-07 NOTE — Progress Notes (Signed)
OB Note S: patient states that chest pain feels a little better. Has been weaned down to 1.5L [place]  Intake/Output Summary (Last 24 hours) at 01/07/16 2255 Last data filed at 01/07/16 2200  Gross per 24 hour  Intake 3628.27 ml  Output   8250 ml  Net -4621.73 ml    Patient Vitals for the past 6 hrs:  BP Temp Temp src Pulse Resp SpO2  01/07/16 2232 108/62 mmHg 98.8 F (37.1 C) Oral (!) 107 20 -  01/07/16 2227 - - - (!) 114 - 95 %  01/07/16 2216 - - - (!) 110 - 93 %  01/07/16 2212 - - - (!) 114 - 97 %  01/07/16 2207 - - - (!) 109 - 96 %  01/07/16 2020 113/67 mmHg 98 F (36.7 C) Oral (!) 110 (!) 24 96 %  01/07/16 1900 117/71 mmHg - - (!) 105 - -  01/07/16 1859 117/71 mmHg - - (!) 105 20 95 %  01/07/16 1805 118/75 mmHg - - (!) 112 - 91 %  01/07/16 1800 114/76 mmHg - - 95 20 94 %  01/07/16 1703 116/81 mmHg - - 98 20 92 %   NAD CTAB, no respiratory distress No MRGs, normal s1 and s2. Slightly tachy GU: UOP with approximately 200mL UOP that appears slightly blood tinged  A/P: pt stable To come off Mg within the hour Will leave foley in overnight. Pt on ppx lovenox and could have caused some of the blood tinged UOP. Consider taking it out in AM if UOP clears up CBC and CMP in AM Continue on current medication regimen  Anna Walls, Jr MD Attending Center for [facility](Faculty Practice)

## 2016-01-07 NOTE — Progress Notes (Signed)
*  PRELIMINARY RESULTS* Echocardiogram 2D Echocardiogram has been performed.  Jeryl Columbialliott, Raghad Lorenz 01/07/2016, 8:29 AM

## 2016-01-07 NOTE — Consult Note (Signed)
Reason for Consult: chest pain, dyspnea and elevated troponin in post-partum patient Primary Cardiologist: new Referring Physician: Dr. Eugenie Filler Anna Walls is an 35 y.o. female.  HPI: Anna Walls is a 35 yo G5P5004 10 days post-operative for normal spontaneous vaginal delivery who presented to [facility] hospital with dyspnea, chest pain and left-sided headache for 3 days. She took [facility] power for her headache without resolution, she endorses worse symptoms lying flat and she has chest discomfort. She characterizes her chest discomfort as pressure and tightness and moves into her throat and upper abdomen. She is tolerating breastfeeding and pumping.   She has done fairly well at home until development of these symptoms. She was found to have negative CTA for PE but elevated BNP 311 and elevated troponin. On exam, she was noted to have 3+ pitting edema and she was given 80 mg IV lasix.   Cardiology consulted given symptoms in setting of abnormal BNP and troponin.   She received prophylactic dose of lovenox earlier today (40 mg IV x1). She was given an additional 55 mg to become therapeutic and scheduled 95 mg IV bid. Echocardiogram ordered.      Past Medical History  Diagnosis Date  . Sickle cell trait ([place])   . Low back pain   . Chronic headaches   . Hyperemesis     History reviewed. No pertinent past surgical history.  Family History  Problem Relation Age of Onset  . Family history unknown: Yes  no known family history of heart failure  Social History:  reports that she has never smoked. She has never used smokeless tobacco. She reports that she does not drink alcohol or use illicit drugs.  Allergies: No Known Allergies  Medications:  I have reviewed the patient's current medications. Prior to Admission:  Prescriptions prior to admission  Medication Sig Dispense Refill Last Dose  . albuterol (PROVENTIL HFA;VENTOLIN HFA) 108 (90 Base) MCG/ACT inhaler Inhale 1-2 puffs  into the lungs every 6 (six) hours as needed for wheezing or shortness of breath. Reported on 12/19/2015   emergency  . ibuprofen (ADVIL,MOTRIN) 600 MG tablet Take 1 tablet (600 mg total) by mouth every 6 (six) hours. 60 tablet 0   . omeprazole (PRILOSEC) 40 MG capsule Take 40 mg by mouth daily.   12/26/2015 at Unknown time  . Prenatal Vit-Fe Fumarate-FA (PRENATAL COMPLETE) 14-0.4 MG TABS Take 1 tablet by mouth daily. 30 each 6 12/13/2015 at Unknown time  . ranitidine (ZANTAC) 150 MG capsule Take 1 capsule (150 mg total) by mouth 2 (two) times daily. 60 capsule 0 12/26/2015 at Unknown time   Scheduled: . docusate sodium  100 mg Oral Daily  . enoxaparin (LOVENOX) injection  1 mg/kg Subcutaneous Q12H  . NIFEdipine  30 mg Oral BID  . potassium chloride  40 mEq Oral BID  . prenatal multivitamin  1 tablet Oral Q1200    Results for orders placed or performed during the hospital encounter of 01/06/16 (from the past 48 hour(s))  Protein / creatinine ratio, urine     Status: Abnormal   Collection Time: 01/06/16  7:17 PM  Result Value Ref Range   Creatinine, Urine 117.00 mg/dL   Total Protein, Urine 26 mg/dL    Comment: NO NORMAL RANGE ESTABLISHED FOR THIS TEST   Protein Creatinine Ratio 0.22 (H) 0.00 - 0.15 mg/mg[Cre]  Urinalysis, Routine w reflex microscopic (not at [facility])     Status: Abnormal   Collection Time: 01/06/16  7:17 PM  Result Value Ref  Range   Color, Urine YELLOW YELLOW   APPearance CLEAR CLEAR   Specific Gravity, Urine 1.010 1.005 - 1.030   pH 6.0 5.0 - 8.0   Glucose, UA NEGATIVE NEGATIVE mg/dL   Hgb urine dipstick MODERATE (A) NEGATIVE   Bilirubin Urine NEGATIVE NEGATIVE   Ketones, ur NEGATIVE NEGATIVE mg/dL   Protein, ur NEGATIVE NEGATIVE mg/dL   Nitrite NEGATIVE NEGATIVE   Leukocytes, UA TRACE (A) NEGATIVE  Urine microscopic-add on     Status: Abnormal   Collection Time: 01/06/16  7:17 PM  Result Value Ref Range   Squamous Epithelial / LPF 0-5 (A) NONE SEEN   WBC, UA 0-5 0 -  5 WBC/hpf   RBC / HPF 6-30 0 - 5 RBC/hpf   Bacteria, UA FEW (A) NONE SEEN  CBC     Status: Abnormal   Collection Time: 01/06/16  7:46 PM  Result Value Ref Range   WBC 7.4 4.0 - 10.5 K/uL   RBC 3.66 (L) 3.87 - 5.11 MIL/uL   Hemoglobin 9.8 (L) 12.0 - 15.0 g/dL   HCT 27.2 (L) 36.0 - 46.0 %   MCV 74.3 (L) 78.0 - 100.0 fL   MCH 26.8 26.0 - 34.0 pg   MCHC 36.0 30.0 - 36.0 g/dL   RDW 13.8 11.5 - 15.5 %   Platelets 317 150 - 400 K/uL  Comprehensive metabolic panel     Status: Abnormal   Collection Time: 01/06/16  7:46 PM  Result Value Ref Range   Sodium 138 135 - 145 mmol/L   Potassium 3.2 (L) 3.5 - 5.1 mmol/L   Chloride 108 101 - 111 mmol/L   CO2 26 22 - 32 mmol/L   Glucose, Bld 104 (H) 65 - 99 mg/dL   BUN 17 6 - 20 mg/dL   Creatinine, Ser 0.69 0.44 - 1.00 mg/dL   Calcium 8.2 (L) 8.9 - 10.3 mg/dL   Total Protein 6.4 (L) 6.5 - 8.1 g/dL   Albumin 2.9 (L) 3.5 - 5.0 g/dL   AST 14 (L) 15 - 41 U/L   ALT 23 14 - 54 U/L   Alkaline Phosphatase 86 38 - 126 U/L   Total Bilirubin 0.9 0.3 - 1.2 mg/dL   GFR calc non Af Amer >60 >60 mL/min   GFR calc Af Amer >60 >60 mL/min    Comment: (NOTE) The eGFR has been calculated using the CKD EPI equation. This calculation has not been validated in all clinical situations. eGFR's persistently <60 mL/min signify possible Chronic Kidney Disease.    Anion gap 4 (L) 5 - 15  Brain natriuretic peptide     Status: Abnormal   Collection Time: 01/06/16  7:46 PM  Result Value Ref Range   B Natriuretic Peptide 311.2 (H) 0.0 - 100.0 pg/mL    Comment: Performed at [facility]  Troponin I (q 6hr x 3)     Status: Abnormal   Collection Time: 01/06/16 10:29 PM  Result Value Ref Range   Troponin I 1.26 (HH) <0.031 ng/mL    Comment:        POSSIBLE MYOCARDIAL ISCHEMIA. SERIAL TESTING RECOMMENDED. CRITICAL RESULT CALLED TO, READ BACK BY AND VERIFIED WITH: Mearl Latin AT [facility] 01/07/16 0054 WAYK CRITICAL RESULT CALLED TO, READ BACK BY AND  VERIFIED WITH: BARNES,J. AT 0056 ON 01/07/2016 BY HOUEGNIFIO M   Type and screen [place]     Status: None   Collection Time: 01/06/16 10:29 PM  Result Value Ref Range  ABO/RH(D) B POS    Antibody Screen NEG    Sample Expiration 01/09/2016   CBC     Status: Abnormal   Collection Time: 01/06/16 10:29 PM  Result Value Ref Range   WBC 8.4 4.0 - 10.5 K/uL   RBC 3.59 (L) 3.87 - 5.11 MIL/uL   Hemoglobin 9.5 (L) 12.0 - 15.0 g/dL   HCT 26.8 (L) 36.0 - 46.0 %   MCV 74.7 (L) 78.0 - 100.0 fL   MCH 26.5 26.0 - 34.0 pg   MCHC 35.4 30.0 - 36.0 g/dL   RDW 13.8 11.5 - 15.5 %   Platelets 340 150 - 400 K/uL  Creatinine, serum     Status: None   Collection Time: 01/06/16 10:29 PM  Result Value Ref Range   Creatinine, Ser 0.67 0.44 - 1.00 mg/dL   GFR calc non Af Amer >60 >60 mL/min   GFR calc Af Amer >60 >60 mL/min    Comment: (NOTE) The eGFR has been calculated using the CKD EPI equation. This calculation has not been validated in all clinical situations. eGFR's persistently <60 mL/min signify possible Chronic Kidney Disease.     Dg Chest 2 View  01/06/2016  CLINICAL DATA:  Chest pain and shortness of Breath since vaginal delivery 12/27/2015. EXAM: CHEST  2 VIEW COMPARISON:  09/13/2015. FINDINGS: The cardiac silhouette, mediastinal and hilar contours are within normal limits and stable. There are bilateral airspace opacities likely reflecting pneumonia. No pleural effusion. No pneumothorax. IMPRESSION: Bilateral pulmonary infiltrates. Electronically Signed   By: Marijo Sanes M.D.   On: 01/06/2016 21:00   Ct Angio Chest Pe W Or Wo Contrast  01/06/2016  CLINICAL DATA:  Severe dyspnea and headache for 3 days EXAM: CT ANGIOGRAPHY CHEST WITH CONTRAST TECHNIQUE: Multidetector CT imaging of the chest was performed using the standard protocol during bolus administration of intravenous contrast. Multiplanar CT image reconstructions and MIPs were obtained to evaluate the vascular  anatomy. CONTRAST:  100 mL Isovue 370 intravenous COMPARISON:  Radiographs 01/06/2016 FINDINGS: Cardiovascular: There is good opacification of the pulmonary arteries. There is no pulmonary embolism. The thoracic aorta is normal in caliber and intact. Lungs: Airspace opacities in the central right upper lobe, central right lower lobe, left lower lobe. Milder involvement in the left upper lobe with a few scattered patchy opacities. This may represent hemorrhage, infectious infiltrate, edema. Central airways: Patent Effusions: Small to moderate right pleural effusion. Very small left pleural effusion. Lymphadenopathy: There are a few mildly prominent mediastinal nodes. These are more likely reactive. Esophagus: Unremarkable Upper abdomen: No significant abnormality Musculoskeletal: No significant abnormality Review of the MIP images confirms the above findings. IMPRESSION: 1. Negative for pulmonary embolism. 2. Extensive airspace opacities involving much of the right lung and left lower lobe with relative sparing of the periphery. This may be infectious, but hemorrhage or edema is also possible. Pleural effusions are present, right greater than left. Electronically Signed   By: Andreas Newport M.D.   On: 01/06/2016 23:47    Review of Systems  Constitutional: Positive for malaise/fatigue. Negative for fever and chills.  HENT: Negative for ear pain and tinnitus.   Respiratory: Positive for shortness of breath. Negative for hemoptysis.   Cardiovascular: Positive for chest pain and leg swelling.  Gastrointestinal: Negative for nausea, vomiting and diarrhea.  Genitourinary: Negative for dysuria and hematuria.  Musculoskeletal: Negative for myalgias.  Skin: Negative for rash.  Neurological: Positive for headaches. Negative for dizziness and tingling.  Endo/Heme/Allergies: Negative for polydipsia. Does not  bruise/bleed easily.  Psychiatric/Behavioral: Negative for suicidal ideas, hallucinations and substance  abuse.   Blood pressure 127/80, pulse 93, temperature 98.7 F (37.1 C), temperature source Oral, resp. rate 20, height 5' 4" (1.626 m), weight 93.895 kg (207 lb), SpO2 98 %, unknown if currently breastfeeding. Physical Exam  Nursing note and vitals reviewed. Constitutional: She is oriented to person, place, and time. She appears well-developed and well-nourished. She appears distressed.  Mildly uncomfortable  HENT:  Head: Normocephalic and atraumatic.  Nose: Nose normal.  Mouth/Throat: Oropharynx is clear and moist. No oropharyngeal exudate.  Eyes: Conjunctivae and EOM are normal. Pupils are equal, round, and reactive to light. No scleral icterus.  Neck: Normal range of motion. Neck supple. JVD present. No tracheal deviation present.  JVP to earlobes bilaterally  Cardiovascular: Regular rhythm, normal heart sounds and intact distal pulses.  Exam reveals no friction rub.   No murmur heard. Tachycardic, some ectopy  Respiratory: Effort normal and breath sounds normal. She has no rales. She exhibits no tenderness.  Right > left bibasilar rales  GI: Soft. Bowel sounds are normal. She exhibits no distension. There is no tenderness. There is no rebound.  Musculoskeletal: Normal range of motion. She exhibits edema.  Trace to 1+ edema bilaterally  Neurological: She is alert and oriented to person, place, and time. No cranial nerve deficit.  Skin: Skin is warm and dry. No rash noted. She is not diaphoretic. No erythema.  Psychiatric: She has a normal mood and affect. Her behavior is normal. Thought content normal.   Bun/cr 17/0.7, ast/alt 14/23, BNP 311, Trop 1.26, wbc 8.4, h/h 9.3/26, plt 340, na 138, K 3.2 CTA negative for PE, right > left opacity Chest x-ray: right > left opacity  Assessment/Plan: Anna Walls is a 35 yo G5P5004 10 days post-operative for normal spontaneous vaginal delivery who presented to [facility] hospital with dyspnea, chest pain and left-sided headache for 3 days and  found to have evidence of volume overload, hypertension and elevated troponin. Differential is broad but largely focuses to heart failure, coronary artery disease (spontaneous coronary artery disease?), and pre-eclampsia. She felt improved with blood pressure control and 80 mg IV lasix but she continues to have symptoms. I favor increasing lovenox to therapeutic dosing (1 mg/kg bid lovenox) and obtaining echocardiogram in AM to help guide next steps. If she has regional wall motion abnormality or severe global hypokinesis then I favor coronary angiogram to assess for coronary artery disease. For now, agree with trial of anticoagulation, IV diuresis for symptoms and blood pressure control. She received > 325 mg aspirin from her BC powder earlier in the day.   Problem List 1. Acute heart failure - dyspnea, chest pain 2. Chest pain and NSTEMI - type I vs. Type II 3. Post-partum 10 days from 5th pregnancy  4. Right > left bilateral pulmonary opacities  Plan 1. Continue on telemetry 2. Daily 81 mg asa, increase lovenox to treatment doses - 1 mg/kg bid 3. Trend cardiac biomarkers 4. Echocardiogram this AM to assess for regional or global WMA 5. Continue 80 mg IV bid lasix  6. Further diagnostic workup based on echocardiogram and troponin trends   KELLY, JACOB 01/07/2016, 2:23 AM

## 2016-01-08 DIAGNOSIS — O1415 Severe pre-eclampsia, complicating the puerperium: Secondary | ICD-10-CM | POA: Diagnosis not present

## 2016-01-08 DIAGNOSIS — N61 Mastitis without abscess: Secondary | ICD-10-CM | POA: Diagnosis not present

## 2016-01-08 LAB — COMPREHENSIVE METABOLIC PANEL
ALT: 19 U/L (ref 14–54)
AST: 15 U/L (ref 15–41)
Albumin: 2.7 g/dL — ABNORMAL LOW (ref 3.5–5.0)
Alkaline Phosphatase: 94 U/L (ref 38–126)
Anion gap: 7 (ref 5–15)
BUN: 11 mg/dL (ref 6–20)
CHLORIDE: 109 mmol/L (ref 101–111)
CO2: 24 mmol/L (ref 22–32)
CREATININE: 0.65 mg/dL (ref 0.44–1.00)
Calcium: 6.9 mg/dL — ABNORMAL LOW (ref 8.9–10.3)
GFR calc Af Amer: >60 mL/min (ref >60)
GFR calc non Af Amer: >60 mL/min (ref >60)
GLUCOSE: 115 mg/dL — AB (ref 65–99)
Potassium: 3.5 mmol/L (ref 3.5–5.1)
SODIUM: 140 mmol/L (ref 135–145)
Total Bilirubin: 0.5 mg/dL (ref 0.3–1.2)
Total Protein: 6.4 g/dL — ABNORMAL LOW (ref 6.5–8.1)

## 2016-01-08 LAB — CBC
HCT: 29.3 % — ABNORMAL LOW (ref 36.0–46.0)
Hemoglobin: 10.5 g/dL — ABNORMAL LOW (ref 12.0–15.0)
MCH: 26.9 pg (ref 26.0–34.0)
MCHC: 35.8 g/dL (ref 30.0–36.0)
MCV: 74.9 fL — ABNORMAL LOW (ref 78.0–100.0)
PLATELETS: 374 10*3/uL (ref 150–400)
RBC: 3.91 MIL/uL (ref 3.87–5.11)
RDW: 14.2 % (ref 11.5–15.5)
WBC: 8.8 10*3/uL (ref 4.0–10.5)

## 2016-01-08 LAB — URINALYSIS, ROUTINE W REFLEX MICROSCOPIC
BILIRUBIN URINE: NEGATIVE
Glucose, UA: NEGATIVE mg/dL
Ketones, ur: NEGATIVE mg/dL
Nitrite: NEGATIVE
PH: 8.5 — AB (ref 5.0–8.0)
Protein, ur: 100 mg/dL — AB
SPECIFIC GRAVITY, URINE: 1.015 (ref 1.005–1.030)

## 2016-01-08 LAB — URINE MICROSCOPIC-ADD ON

## 2016-01-08 MED ORDER — PROMETHAZINE HCL 25 MG PO TABS
25.0000 mg | ORAL_TABLET | Freq: Four times a day (QID) | ORAL | Status: DC | PRN
  Administered 2016-01-08: 25 mg via ORAL
  Filled 2016-01-08: qty 1

## 2016-01-08 MED ORDER — ACETAMINOPHEN 325 MG PO TABS
325.0000 mg | ORAL_TABLET | Freq: Once | ORAL | Status: AC
  Administered 2016-01-08: 325 mg via ORAL
  Filled 2016-01-08: qty 1

## 2016-01-08 MED ORDER — DICLOXACILLIN SODIUM 500 MG PO CAPS
500.0000 mg | ORAL_CAPSULE | Freq: Four times a day (QID) | ORAL | Status: DC
  Administered 2016-01-08 – 2016-01-11 (×13): 500 mg via ORAL
  Filled 2016-01-08 (×19): qty 1

## 2016-01-08 NOTE — Lactation Note (Addendum)
Lactation Consultation Note  Patient Name: Anna MangoMaimouna Walls AVWUJ'WToday's Date: 01/08/2016   Consult with mother who delivered on 6/15 and was readmitted for SOB. Yesterday she noticed a lump on the top middle aspect of the right breast. Last night she developed a fever and today right breast is noted to be tender, warm to touch and reddened to the upper left quadrant when looking toward patient. Breast is noted to be engorged in the reddened area. Her infant comes to visit sometimes and mom reports she is not BF infant due to the pain in her nipple area. Infant in room with mom, Enc her to place infant to breast and she declined. She is pumping with a DEBP every 2 hours and getting up to 8 oz EBM between both breasts. Mom is in a lot of pain and right breast noted to be very tender to touch.   OB in to assess mom and has decided to begin antibiotics for Mastitis, she is also taking antiinflammatory medications. Mom reports pain medication is not helping much.  Advised mom to pump or BF every 2 hours with DEBP. Mom has been using ice this morning at recommendation of another LC due to suspected engorgement. After assessing mom, I advised them to use warm moist compresses to breasts or soak breast in a tub of warm water at least 20 minutes before pumping as a plugged duct is suspected based on history mom gave me. Ice can be used after pumping for 20 minutes for comfort for 24-48 hours.   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 Mom worried about producing too much milk, advised that priority today should be to try and remove plug and empty breast frequently due to mastitis, then we can work to back her production off.   Will follow up tomorrow and prn.     Maternal Data    Feeding    LATCH Score/Interventions                      Lactation Tools Discussed/Used     Consult Status      Ed BlalockSharon S Vaibhav Fogleman 01/08/2016, 1:43  PM

## 2016-01-08 NOTE — Progress Notes (Signed)
Examined patient. Febrile, lateral section right breast is tender, indurated, warm, and erythematous. Lactation working w/ patient. This is mastitis. Will start dicloxacillin. No signs abscess currently.

## 2016-01-08 NOTE — Progress Notes (Addendum)
Daily Postpartum Note  Admission Date: 01/06/2016 Current Date: 01/08/2016 7:26 AM  Anna Walls is a 35 y.o. [id number] HD#3, readmitted for HAd, chest pain, SOB after 6/15 uncomplicated SVD, prenatal course and delivery. .  Pregnancy complicated by: BMI 35, [place] trait, h/o headaches  Overnight/24hr events:  Patient states she has recurrence as with other pregnancies and decreased ability to pump milk with subsequent engorgement, chills, and discomfort. UOP cleared up  Subjective:  Prior to engorgement, patient felt better respiratory s/s and chest discomfort.   Objective:    Intake/Output Summary (Last 24 hours) at 01/08/16 0726 Last data filed at 01/08/16 0647  Gross per 24 hour  Intake 3158.27 ml  Output   2650 ml  Net 508.27 ml     Current Vital Signs 24h Vital Sign Ranges  T (!) 101.6 F (38.7 C) Temp  Avg: 98.7 F (37.1 C)  Min: 97.6 F (36.4 C)  Max: 101.6 F (38.7 C)  BP 115/85 mmHg BP  Min: 101/61  Max: 120/83  HR (!) 110 Pulse  Avg: 104.9  Min: 91  Max: 116  RR 20 Resp  Avg: 19.6  Min: 18  Max: 24  SaO2 93 % Nasal Cannula SpO2  Avg: 94.5 %  Min: 91 %  Max: 98 %       24 Hour I/O Current Shift I/O  Time Ins Outs 06/26 0701 - 06/27 0700 In: 3158.3 [P.O.:2030; I.V.:1128.3] Out: 2650 [Urine:2650]     Patient Vitals for the past 24 hrs:  BP Temp Temp src Pulse Resp SpO2  01/08/16 0652 - - - (!) 110 - 93 %  01/08/16 0650 - (!) 101.6 F (38.7 C) Oral - - -  01/08/16 0642 - - - (!) 104 - 97 %  01/08/16 0632 - - - (!) 112 - 95 %  01/08/16 0534 115/85 mmHg - - (!) 111 - -  01/08/16 0533 - 99.2 F (37.3 C) Oral - - -  01/08/16 0527 - - - (!) 108 - 97 %  01/08/16 0512 - - - (!) 101 - 94 %  01/08/16 0457 - - - - - 93 %  01/08/16 0452 - - - (!) 103 - 94 %  01/08/16 0137 114/71 mmHg 98.2 F (36.8 C) Oral (!) 104 20 95 %  01/08/16 0127 - - - 92 - 95 %  01/07/16 2352 - - - 92 - 94 %  01/07/16 2347 - - - (!) 102 - 96 %  01/07/16 2342 - - - 91 - 94 %  01/07/16  2232 108/62 mmHg 98.8 F (37.1 C) Oral (!) 107 20 -  01/07/16 2227 - - - (!) 114 - 95 %  01/07/16 2216 - - - (!) 110 - 93 %  01/07/16 2212 - - - (!) 114 - 97 %  01/07/16 2207 - - - (!) 109 - 96 %  01/07/16 2020 113/67 mmHg 98 F (36.7 C) Oral (!) 110 (!) 24 96 %  01/07/16 1900 117/71 mmHg - - (!) 105 - -  01/07/16 1859 117/71 mmHg - - (!) 105 20 95 %  01/07/16 1805 118/75 mmHg - - (!) 112 - 91 %  01/07/16 1800 114/76 mmHg - - 95 20 94 %  01/07/16 1703 116/81 mmHg - - 98 20 92 %  01/07/16 1600 109/64 mmHg 98.4 F (36.9 C) Oral (!) 102 18 94 %  01/07/16 1508 - - - - - 94 %  01/07/16 1505 105/68 mmHg - - Marland Kitchen(!)  102 18 94 %  01/07/16 1501 - - - - 18 -  01/07/16 1403 101/61 mmHg - - (!) 103 18 94 %  01/07/16 1320 - - - (!) 104 - 94 %  01/07/16 1315 - - - (!) 103 - 95 %  01/07/16 1310 - - - (!) 105 - 93 %  01/07/16 1305 105/62 mmHg - - (!) 111 - 93 %  01/07/16 1302 - - - - 20 -  01/07/16 1202 (!) 107/58 mmHg 98.1 F (36.7 C) Oral (!) 111 18 97 %  01/07/16 1103 111/64 mmHg - - (!) 116 20 -  01/07/16 1102 - 97.6 F (36.4 C) Oral (!) 110 18 95 %  01/07/16 1005 120/83 mmHg - - 100 20 94 %  01/07/16 0957 - - - - 20 -  01/07/16 0902 116/76 mmHg - - 96 20 94 %  01/07/16 0805 120/69 mmHg 98.2 F (36.8 C) Oral (!) 101 20 98 %  01/07/16 0727 - - - (!) 109 - 93 %   Pt currently on 1.5L [place]  UOP: approx 75-172mL/hr  Physical exam: General: Well nourished, well developed female in mild distress and chills. Abdomen: Fundus NTTP Cardiovascular: no MRGs, normal s1 and s2. Slightly tachy Breast: engorged, firm, slightly TTP but no erythema, abscess felt Respiratory: CTAB GU: foley in place with slightly concentrated UOP Extremities: no clubbing, cyanosis or edema Skin: Warm and dry.   Medications: Current Facility-Administered Medications  Medication Dose Route Frequency Provider Last Rate Last Dose  . acetaminophen (TYLENOL) tablet 650 mg  650 mg Oral Q4H PRN Willodean Rosenthal, MD    650 mg at 01/07/16 1507  . butalbital-acetaminophen-caffeine (FIORICET, ESGIC) 50-325-40 MG per tablet 2 tablet  2 tablet Oral Q6H PRN Federico Flake, MD   2 tablet at 01/07/16 2229  . calcium carbonate (TUMS - dosed in mg elemental calcium) chewable tablet 400 mg of elemental calcium  2 tablet Oral Q4H PRN Willodean Rosenthal, MD      . colchicine tablet 0.6 mg  0.6 mg Oral Daily [place] Si, MD   0.6 mg at 01/07/16 1632  . docusate sodium (COLACE) capsule 100 mg  100 mg Oral BID PRN [place] Bing, MD   100 mg at 01/07/16 0943  . enoxaparin (LOVENOX) injection 40 mg  40 mg Subcutaneous Once Chilton Si, MD      . furosemide (LASIX) tablet 40 mg  40 mg Oral Daily PRN [place]ilton Si, MD      . hydrALAZINE (APRESOLINE) injection 10 mg  10 mg Intravenous Once PRN Willodean Rosenthal, MD      . ibuprofen (ADVIL,MOTRIN) tablet 800 mg  800 mg Oral TID Chilton Si, MD   800 mg at 01/07/16 2344  . labetalol (NORMODYNE,TRANDATE) injection 20-80 mg  20-80 mg Intravenous Q10 min PRN Willodean Rosenthal, MD      . lactated ringers infusion   Intravenous Continuous Willodean Rosenthal, MD 20 mL/hr at 01/07/16 1900    . NIFEdipine (PROCARDIA-XL/ADALAT-CC/NIFEDICAL-XL) 24 hr tablet 30 mg  30 mg Oral BID Willodean Rosenthal, MD   30 mg at 01/07/16 2018  . oxyCODONE (Oxy IR/ROXICODONE) immediate release tablet 5-10 mg  5-10 mg Oral Q6H PRN [place] Bing, MD   5 mg at 01/08/16 0529  . potassium chloride SA (K-DUR,KLOR-CON) CR tablet 40 mEq  40 mEq Oral TID AC [place] Bing, MD   40 mEq at 01/07/16 1632  . prenatal multivitamin tablet 1 tablet  1 tablet Oral Q1200 Willodean Rosenthal, MD  1 tablet at 01/07/16 0943    Labs:   Recent Labs Lab 01/06/16 2229 01/07/16 1156 01/08/16 0549  WBC 8.4 6.5 8.8  HGB 9.5* 10.6* 10.5*  HCT 26.8* 29.8* 29.3*  PLT 340 368 374     Recent Labs Lab 01/07/16 0405 01/07/16 1156 01/08/16 0549  NA 137 138 140  K 3.1* 3.2*  3.5  CL 99* 101 109  CO2 27 26 24   BUN 15 12 11   CREATININE 0.76 0.67 0.65  CALCIUM 8.1* 7.3* 6.9*  PROT 7.3 7.0 6.4*  BILITOT 0.7 0.4 0.5  ALKPHOS 103 97 94  ALT 26 24 19   AST 18 18 15   GLUCOSE 249* 195* 115*    Radiology: none  Assessment & Plan:  Breast engorgement but patient otherwise stable/improved *Postpartum care: current s/s likely from breast engorgement. She has tried to pump throughout the night but to no success. Will have lacatation come by and see and do cool compresses in the interim and continue to try to pump, and tylenol. Pt already on scheduled NSAIDs for the pericarditis. Foley out and f/u void *Pulm: will continue to try to wean to RA but will reassess once pt more comfortable from a breast standpoint *CV: cards following. Appreciate recs -continue bid procardia XL -s/p 24hrs of Mg which ended late on 6/26 *FEN/GI: regular diet *PPx: OOB ad lib, lovenox qday  Cornelia Copaharlie Demerius Podolak, Jr. MD Attending Center [facility](Faculty Practice)

## 2016-01-09 DIAGNOSIS — N61 Mastitis without abscess: Secondary | ICD-10-CM | POA: Diagnosis not present

## 2016-01-09 DIAGNOSIS — I309 Acute pericarditis, unspecified: Secondary | ICD-10-CM | POA: Diagnosis not present

## 2016-01-09 MED ORDER — SULFAMETHOXAZOLE-TRIMETHOPRIM 800-160 MG PO TABS
1.0000 | ORAL_TABLET | Freq: Two times a day (BID) | ORAL | Status: DC
  Administered 2016-01-09 – 2016-01-11 (×4): 1 via ORAL
  Filled 2016-01-09 (×6): qty 1

## 2016-01-09 NOTE — Lactation Note (Signed)
Lactation Consultation Note  Patient Name: Anna MangoMaimouna Walls JXBJY'NToday's Date: 01/09/2016   Follow up with patient in Antenatal. Patient was asleep and awakened to assess. Staff reports that she has been asleep a lot today. Mom reports she has not pumped since 10:30. Engorgement is increased to right breast today. Redness remains. Breast is very tender to touch. Mom did not want me to massage breast and reports increased pain to nipple area. Examined nipple area, no bleb noted. NT took warm moist compresses to room to place on breast prior to pumping.   Follow up tomorrow     Maternal Data    Feeding    [facility]                      Lactation Tools Discussed/Used     Consult Status      Ed BlalockSharon S Yahsir Wickens 01/09/2016, 2:32 PM

## 2016-01-09 NOTE — Progress Notes (Signed)
Subjective: Hospitalized now for obvious mastitis Begun on dicloxacillin 6/27 Patient reports for with breast-feeding. She is pumping now .  She desires to suppress her breast milk at this point. She's had mastitis with 3 of her children. Will discuss with lactation whether to place on pseudoephedrine or if other alternatives for avoiding the breast tenderness can be addressed  Objective: I have reviewed patient's vital signs and medications. BP 124/66 mmHg  Pulse 115  Temp(Src) 99.1 F (37.3 C) (Oral)  Resp 24  Ht 5\' 4"  (1.626 m)  Wt 192 lb 14.4 oz (87.499 kg)  BMI 33.09 kg/m2  SpO2 98% Temperature 102 earlier this afternoon 4 PM Temp:  [98.5 F (36.9 C)-102 F (38.9 C)] 99.1 F (37.3 C) (06/28 2057) Pulse Rate:  [105-131] 115 (06/28 2057) Resp:  [18-24] 24 (06/28 1932) BP: (113-130)/(66-77) 124/66 mmHg (06/28 1932) SpO2:  [97 %-98 %] 98 % (06/28 2057) Weight:  [192 lb 14.4 oz (87.499 kg)] 192 lb 14.4 oz (87.499 kg) (06/28 1530)  General: alert, cooperative and fatigued Right upper outer quadrant of right breast erythematous without fluctuance over 20% of breast CBC Latest Ref Rng 01/08/2016 01/07/2016 01/06/2016  WBC 4.0 - 10.5 K/uL 8.8 6.5 8.4  Hemoglobin 12.0 - 15.0 g/dL 10.5(L) 10.6(L) 9.5(L)  Hematocrit 36.0 - 46.0 % 29.3(L) 29.8(L) 26.8(L)  Platelets 150 - 400 K/uL 374 368 340      Assessment/Plan: Persistent fever secondary to mastitis Since fevers recurring now after over 24 hours on dicloxacillin will add Septra Patient will be transferred to third floor to allow better bed utilization  LOS: 2 days    Anna Walls 01/09/2016, 10:03 PM

## 2016-01-09 NOTE — Progress Notes (Signed)
Patient ID: Anna Walls, female   DOB: 02/02/1981, 35 y.o.   MRN: [id number]   Subjective: Interval History:Febrile, now with obvious mastitis. Begun on Dicloxacillin yesterday and continues to have erythema, pain, and chills. CP and SOB have resolved. Objective: Vital signs in last 24 hours: Temp:  [99.6 F (37.6 C)-102.2 F (39 C)] 100.6 F (38.1 C) (06/28 0815) Pulse Rate:  [112-131] 131 (06/28 0815) Resp:  [18-24] 20 (06/28 0815) BP: (111-133)/(71-79) 130/71 mmHg (06/28 0815) SpO2:  [93 %-97 %] 97 % (06/28 0837)     BP 130/71 mmHg  Pulse 131  Temp(Src) 100.6 F (38.1 C) (Oral)  Resp 20  Ht 5\' 4"  (1.626 m)  Wt 207 lb (93.895 kg)  BMI 35.51 kg/m2  SpO2 97% General appearance: alert, cooperative and appears stated age Lungs: normal effort Breasts: erythematous wedge 9 o'clock on right breast with some induration Abdomen: soft, non-tender; bowel sounds normal; no masses,  no organomegaly Extremities: extremities normal, atraumatic, no cyanosis or edema  Results for orders placed or performed during the hospital encounter of 01/06/16 (from the past 24 hour(s))  Urinalysis, Routine w reflex microscopic (not at [facility])     Status: Abnormal   Collection Time: 01/08/16  1:40 PM  Result Value Ref Range   Color, Urine YELLOW YELLOW   APPearance CLEAR CLEAR   Specific Gravity, Urine 1.015 1.005 - 1.030   pH 8.5 (H) 5.0 - 8.0   Glucose, UA NEGATIVE NEGATIVE mg/dL   Hgb urine dipstick LARGE (A) NEGATIVE   Bilirubin Urine NEGATIVE NEGATIVE   Ketones, ur NEGATIVE NEGATIVE mg/dL   Protein, ur 409100 (A) NEGATIVE mg/dL   Nitrite NEGATIVE NEGATIVE   Leukocytes, UA SMALL (A) NEGATIVE  Urine microscopic-add on     Status: Abnormal   Collection Time: 01/08/16  1:40 PM  Result Value Ref Range   Squamous Epithelial / LPF 0-5 (A) NONE SEEN   WBC, UA 6-30 0 - 5 WBC/hpf   RBC / HPF TOO NUMEROUS TO COUNT 0 - 5 RBC/hpf   Bacteria, UA MANY (A) NONE SEEN   Urine-Other MUCOUS PRESENT      Studies/Results:  Scheduled Meds: . colchicine  0.6 mg Oral Daily  . dicloxacillin  500 mg Oral Q6H  . ibuprofen  800 mg Oral TID  . NIFEdipine  30 mg Oral BID  . prenatal multivitamin  1 tablet Oral Q1200   Continuous Infusions: . lactated ringers 20 mL/hr at 01/07/16 1900   PRN Meds:acetaminophen, butalbital-acetaminophen-caffeine, calcium carbonate, docusate sodium, furosemide, hydrALAZINE, labetalol, oxyCODONE, promethazine  Assessment/Plan: Active Problems:   Preeclampsia in postpartum period   Postoperative pulmonary edema (HCC)   Chest pain at rest   Shortness of breath   Acute pericarditis   Mastitis, right, acute  Give 24 hours to allow for response to Abx--consider changing to add MRSA coverage if needed. Other issues are improved including SOB and CP. BP's are good. Continue NSAIDS and colchicine per cards   LOS: 2 days   PRATT,TANYA S, MD 01/09/2016 9:10 AM

## 2016-01-10 ENCOUNTER — Encounter: Payer: Medicaid Other | Admitting: Cardiology

## 2016-01-10 DIAGNOSIS — O1495 Unspecified pre-eclampsia, complicating the puerperium: Secondary | ICD-10-CM | POA: Diagnosis not present

## 2016-01-10 MED ORDER — PSEUDOEPHEDRINE HCL 30 MG PO TABS
30.0000 mg | ORAL_TABLET | Freq: Every day | ORAL | Status: DC
  Administered 2016-01-10 – 2016-01-11 (×2): 30 mg via ORAL
  Filled 2016-01-10 (×2): qty 1

## 2016-01-10 NOTE — Progress Notes (Signed)
PROGRESS NOTE Subjective: Hospitalized now for obvious mastitis Begun on dicloxacillin 6/27, bactrim 6/28 Patient reports for with breast-feeding. She is pumping now .  She desires to suppress her breast milk at this point. She's had mastitis with 3 of her children.  Feels like things are improving - less breast tenderness.  Last fever, 7pm on 6/29.  Objective: I have reviewed patient's vital signs and medications. BP 108/66 mmHg  Pulse 98  Temp(Src) 98.4 F (36.9 C) (Oral)  Resp 17  Ht 5\' 4"  (1.626 m)  Wt 190 lb 8 oz (86.41 kg)  BMI 32.68 kg/m2  SpO2 100% Temperature 102 earlier this afternoon 4 PM Temp:  [98.4 F (36.9 C)-102 F (38.9 C)] 98.4 F (36.9 C) (06/29 1330) Pulse Rate:  [86-130] 98 (06/29 1330) Resp:  [16-24] 17 (06/29 1330) BP: (101-124)/(59-78) 108/66 mmHg (06/29 1330) SpO2:  [97 %-100 %] 100 % (06/29 1330) Weight:  [190 lb 8 oz (86.41 kg)-192 lb 14.4 oz (87.499 kg)] 190 lb 8 oz (86.41 kg) (06/29 0924)  General: alert, cooperative and fatigued GI: soft, non-tender; bowel sounds normal; no masses,  no organomegaly Extremities: Homans sign is negative, no sign of DVT and no edema, redness or tenderness in the calves or thighs Right upper outer quadrant of right breast erythematous without fluctuance measuring 5x7cm.   CBC Latest Ref Rng 01/08/2016 01/07/2016 01/06/2016  WBC 4.0 - 10.5 K/uL 8.8 6.5 8.4  Hemoglobin 12.0 - 15.0 g/dL 10.5(L) 10.6(L) 9.5(L)  Hematocrit 36.0 - 46.0 % 29.3(L) 29.8(L) 26.8(L)  Platelets 150 - 400 K/uL 374 368 340      Assessment/Plan: Improving mastitis. On dicloxacillin and Bactrim.   Will give pseudoephedrine to help with overproduction of milk. D/c tomorrow if remains fever free.     Anna Walls JEHIEL 01/10/2016, 2:06 PM

## 2016-01-11 ENCOUNTER — Encounter: Payer: Self-pay | Admitting: Physician Assistant

## 2016-01-11 DIAGNOSIS — O1495 Unspecified pre-eclampsia, complicating the puerperium: Secondary | ICD-10-CM | POA: Diagnosis not present

## 2016-01-11 MED ORDER — NIFEDIPINE ER 30 MG PO TB24: 30.0000 mg | ORAL_TABLET | Freq: Two times a day (BID) | ORAL | Status: DC

## 2016-01-11 MED ORDER — SULFAMETHOXAZOLE-TRIMETHOPRIM 800-160 MG PO TABS: 1.0000 | ORAL_TABLET | Freq: Two times a day (BID) | ORAL | Status: DC

## 2016-01-11 MED ORDER — DICLOXACILLIN SODIUM 500 MG PO CAPS: 500.0000 mg | ORAL_CAPSULE | Freq: Four times a day (QID) | ORAL | Status: DC

## 2016-01-11 MED ORDER — PSEUDOEPHEDRINE HCL 30 MG PO TABS: 30.0000 mg | ORAL_TABLET | Freq: Every day | ORAL | Status: DC

## 2016-01-11 MED ORDER — COLCHICINE 0.6 MG PO TABS: 0.6000 mg | ORAL_TABLET | Freq: Every day | ORAL | Status: DC

## 2016-01-11 NOTE — Discharge Summary (Signed)
OB Discharge Summary     Patient Name: Anna Walls DOB: 08/29/1980 MRN: [id number]  Date of admission: 01/06/2016 Delivering MD: This patient has no babies on file.  Date of discharge: 01/11/2016  Admitting diagnosis: PP FEET SWELLING, MIGRAINE, CHEST PAIN Intrauterine pregnancy: Unknown     Secondary diagnosis:  Active Problems:   Preeclampsia in postpartum period   Postoperative pulmonary edema (HCC)   Chest pain at rest   Shortness of breath   Acute pericarditis   Mastitis, right, acute  Additional problems: none     Discharge diagnosis: same                                                                                                 Hospital course:  Patient admitted on 6/25 for postpartum preeclampsia with symptoms of headache, chest pain, shortness of breath.  She was started and maintained on magnesium for 24 hours. Reported to have a elevated troponin, but this was concluded to be a lab error.  She did have an echocardiogram, which showed a trivial pericardial effusion, but otherwise was normal.  She was started on colchicine and ibuprofen for the pericarditis.  She did have a fever and found to have mastitis and was started on antibiotics, which has gradually improved her symptoms.  She was also started on pseudoephedrine for milk overproduction.  Physical exam  Filed Vitals:   01/10/16 1710 01/10/16 2123 01/11/16 0529 01/11/16 0530  BP: 97/65 109/75  107/71  Pulse: 94 84  83  Temp: 98.9 F (37.2 C) 98 F (36.7 C)  98 F (36.7 C)  TempSrc: Oral Oral  Oral  Resp: 18 18  18   Height:      Weight:   190 lb 0.1 oz (86.186 kg)   SpO2: 97% 99%  100%   General: alert, cooperative and no distress  Heart: regular rate, no murmur Lungs: clear to auscultation bilaterally, no wheezing.  Breast: Improving mastitis DVT Evaluation: No evidence of DVT seen on physical exam. Negative Homan's sign. Labs: Lab Results  Component Value Date   WBC 8.8 01/08/2016   HGB  10.5* 01/08/2016   HCT 29.3* 01/08/2016   MCV 74.9* 01/08/2016   PLT 374 01/08/2016   CMP Latest Ref Rng 01/08/2016  Glucose 65 - 99 mg/dL 829(F115(H)  BUN 6 - 20 mg/dL 11  Creatinine 6.210.44 - 3.081.00 mg/dL 6.570.65  Sodium 846135 - 962145 mmol/L 140  Potassium 3.5 - 5.1 mmol/L 3.5  Chloride 101 - 111 mmol/L 109  CO2 22 - 32 mmol/L 24  Calcium 8.9 - 10.3 mg/dL 6.9(L)  Total Protein 6.5 - 8.1 g/dL 6.4(L)  Total Bilirubin 0.3 - 1.2 mg/dL 0.5  Alkaline Phos 38 - 126 U/L 94  AST 15 - 41 U/L 15  ALT 14 - 54 U/L 19    Discharge instruction: per After Visit Summary and "Baby and Me Booklet".  After visit meds:    Medication List    TAKE these medications        albuterol 108 (90 Base) MCG/ACT inhaler  Commonly known as:  PROVENTIL HFA;VENTOLIN HFA  Inhale 1-2 puffs into the lungs every 6 (six) hours as needed for wheezing or shortness of breath. Reported on 12/19/2015     colchicine 0.6 MG tablet  Take 1 tablet (0.6 mg total) by mouth daily.     dicloxacillin 500 MG capsule  Commonly known as:  DYNAPEN  Take 1 capsule (500 mg total) by mouth every 6 (six) hours.     ibuprofen 600 MG tablet  Commonly known as:  ADVIL,MOTRIN  Take 1 tablet (600 mg total) by mouth every 6 (six) hours.     NIFEdipine 30 MG 24 hr tablet  Commonly known as:  PROCARDIA-XL/ADALAT CC  Take 1 tablet (30 mg total) by mouth 2 (two) times daily.     omeprazole 40 MG capsule  Commonly known as:  PRILOSEC  Take 40 mg by mouth daily.     PRENATAL COMPLETE 14-0.4 MG Tabs  Take 1 tablet by mouth daily.     pseudoephedrine 30 MG tablet  Commonly known as:  SUDAFED  Take 1 tablet (30 mg total) by mouth daily.     ranitidine 150 MG capsule  Commonly known as:  ZANTAC  Take 1 capsule (150 mg total) by mouth 2 (two) times daily.     sulfamethoxazole-trimethoprim 800-160 MG tablet  Commonly known as:  BACTRIM DS,SEPTRA DS  Take 1 tablet by mouth every 12 (twelve) hours.        Diet: routine diet  Activity: Advance  as tolerated. Pelvic rest for 6 weeks.   Outpatient follow up:1 week Follow up Appt:Future Appointments Date Time Provider Department Center  01/14/2016 9:00 AM Laurann Montanaayna N Dunn, PA-C [facility]   Follow up Visit:No Follow-up on file.  >30 minutes spent on discharge of this patient.  01/11/2016 Mayo Owczarzak JEHIEL, DO

## 2016-01-11 NOTE — Progress Notes (Signed)
Cardiology Office Note    Date:  01/14/2016  ID:  Anna MangoMaimouna Walls, DOB 03/15/1981, MRN [id number] PCP:  No PCP Per Patient  Cardiologist:  Duke Salviaandolph   Chief Complaint: f/u pericarditis, volume overload  History of Present Illness:  Anna Walls is a 35 y.o. female with history of sickle cell trait, chronic headaches, and recent concern for pericarditis/postpartum preeclampsia who presents for follow-up. She delivered a baby girl on 12/27/15 at 6282w5d and [facility]16 with complaints of dyspnea, pleuritic chest pain (including while laying on back, sitting forward, or laying on side), and persistent headache. She was found to be hypertensive which was somewhat resistant to IV antihypertensives. CTA was negative for PE but elevated BNP 311. There are reports of elevated troponin but all the values I can find in the computer were negative - OB noted question of lab error. On exam, she was noted to have 3+ pitting edema and she was given 80 mg IV lasix. 2D Echo showed mild LVH, EF 60-65%, normal diastolic parameters, trivial pericardial effusion. OB diagnosed her with post-partum pre-eclampsia and she was treated with magnesium and nifedipine. Dr. Duke Salviaandolph saw the patient and felt chest pain was c/w pericarditis thus started the patient on colchicine and ibuprofen for pericarditis. She was started on abx for mastitis and also pseudoephedrine for milk overproduction. Labs notable for post-delivery anemia (Hgb 10.5 at dc), albumin of 2.7, Cr 0.65, CRP 8.2, UA neg for protein.  She presents back to clinic today overall feeling well. Last night she had a brief episode of chest discomfort that felt like something crawling in her chest - relieved with Zantac. She has not had any recurrent edema or SOB. She has not had any CP today and denies any chest discomfort with exertion. She has tapered down ibuprofen to BID.  Past Medical History  Diagnosis Date  . Sickle cell trait  (HCC)   . Low back pain   . Chronic headaches   . Hyperemesis   . Preeclampsia in postpartum period   . Pericarditis     a. Post-partum 12/2015.  Marland Kitchen. Pericardial effusion     a. Trace by echo 12/2015.    No past surgical history on file.  Current Medications: Current Outpatient Prescriptions  Medication Sig Dispense Refill  . colchicine 0.6 MG tablet Take 1 tablet (0.6 mg total) by mouth daily. 30 tablet 3  . dicloxacillin (DYNAPEN) 500 MG capsule Take 1 capsule (500 mg total) by mouth every 6 (six) hours. 28 capsule 0  . ibuprofen (ADVIL,MOTRIN) 600 MG tablet Take 1 tablet (600 mg total) by mouth every 6 (six) hours. 60 tablet 0  . NIFEdipine (PROCARDIA-XL/ADALAT CC) 30 MG 24 hr tablet Take 1 tablet (30 mg total) by mouth 2 (two) times daily. 60 tablet 2  . Prenatal Vit-Fe Fumarate-FA (PRENATAL COMPLETE) 14-0.4 MG TABS Take 1 tablet by mouth daily. 30 each 6  . pseudoephedrine (SUDAFED) 30 MG tablet Take 1 tablet (30 mg total) by mouth daily. 5 tablet 0  . ranitidine (ZANTAC) 150 MG capsule Take 1 capsule (150 mg total) by mouth 2 (two) times daily. 60 capsule 0  . sulfamethoxazole-trimethoprim (BACTRIM DS,SEPTRA DS) 800-160 MG tablet Take 1 tablet by mouth every 12 (twelve) hours. 14 tablet 0   No current facility-administered medications for this visit.     Allergies:   Review of patient's allergies indicates no known allergies.   Social History   Social History  . Marital Status: Married  Spouse Name: N/A  . Number of Children: N/A  . Years of Education: N/A   Social History Main Topics  . Smoking status: Never Smoker   . Smokeless tobacco: Never Used  . Alcohol Use: No  . Drug Use: No  . Sexual Activity: Yes    Birth Control/ Protection: None   Other Topics Concern  . None   Social History Narrative     Family History:  The patient does not know her family history.  ROS:   Please see the history of present illness.  All other systems are reviewed and  otherwise negative.    PHYSICAL EXAM:   VS:  BP 115/72 mmHg  Pulse 82  Ht 5\' 4"  (1.626 m)  Wt 187 lb 9.6 oz (85.095 kg)  BMI 32.19 kg/m2  SpO2 97%  BMI: Body mass index is 32.19 kg/(m^2). GEN: Well nourished, well developed F, in no acute distress HEENT: normocephalic, atraumatic Neck: no JVD, carotid bruits, or masses Cardiac: RRR; no murmurs, rubs, or gallops, no edema  Respiratory:  clear to auscultation bilaterally, normal work of breathing GI: soft, nontender, nondistended, + BS MS: no deformity or atrophy Skin: warm and dry, no rash Neuro:  Alert and Oriented x 3, Strength and sensation are intact, follows commands Psych: euthymic mood, full affect  Wt Readings from Last 3 Encounters:  01/14/16 187 lb 9.6 oz (85.095 kg)  01/11/16 190 lb 0.1 oz (86.186 kg)  12/27/15 204 lb (92.534 kg)      Studies/Labs Reviewed:   EKG:  EKG was ordered today and personally reviewed by me and demonstrates NSR 82bpm, no acute ST-T changes. Mild ST upsloping II, avF, otherwise normal.   Recent Labs: 01/06/2016: B Natriuretic Peptide 311.2* 01/08/2016: ALT 19; BUN 11; Creatinine, Ser 0.65; Hemoglobin 10.5*; Platelets 374; Potassium 3.5; Sodium 140   Lipid Panel    Component Value Date/Time   CHOL 173 08/29/2013 1013   TRIG 175* 08/29/2013 1013   HDL 44 08/29/2013 1013   CHOLHDL 3.9 08/29/2013 1013   VLDL 35 08/29/2013 1013   LDLCALC 94 08/29/2013 1013    Additional studies/ records that were reviewed today include: Summarized above.    ASSESSMENT & PLAN:   1. Pericarditis - symptoms much improved. CP last night was atypical, brief, resolved with antacid. We discussed gradual tapering down of ibuprofen over the next week. Anticipate continuing colchicine 0.6mg  daily for 3 months total then discontinuing, but I will send a message to Dr. Duke Salviaandolph to get her thoughts. I have reviewed the literature and this medication is generally accepted as safe in breastfeeding. We discussed  observation for recurrent symptoms. 2. Pericardial effusion - only trace by recent echo. Follow clinically for now. 3. Post-partum preeclampsia - BP and swelling much improved on current regimen. F/u OB GYN.  Disposition: F/u with Dr. Duke Salviaandolph in 3 months.   Medication Adjustments/Labs and Tests Ordered: Current medicines are reviewed at length with the patient today.  Concerns regarding medicines are outlined above. Medication changes, Labs and Tests ordered today are summarized above and listed in the Patient Instructions accessible in Encounters.   Thomasene MohairSigned, Dayna Dunn PA-C  01/14/2016 9:06 AM    [facility] [address], [place], [place]  [address] Phone: [phone]; Fax: [phone]

## 2016-01-11 NOTE — Discharge Instructions (Signed)
Weigh daily Call [phone] if weight climbs more than 3 pounds in a day or 5 pounds in a week. No salt to very little salt in your diet.  No more than 2000 mg in a day. Call if increased shortness of breath or increased swelling. If you gain 2 lbs in a day then take the lasix (furosemide)    Mastitis Mastitis is inflammation of the breast tissue. It occurs most often in women who are breastfeeding, but it can also affect other women, and even sometimes men. CAUSES  Mastitis is usually caused by a bacterial infection. Bacteria enter the breast tissue through cuts or openings in the skin. Typically, this occurs with breastfeeding because of cracked or irritated skin. Sometimes, it can occur even when there is no opening in the skin. It can be associated with plugged milk (lactiferous) ducts. Nipple piercing can also lead to mastitis. Also, some forms of breast cancer can cause mastitis. SIGNS AND SYMPTOMS   Swelling, redness, tenderness, and pain in an area of the breast.  Swelling of the glands under the arm on the same side.  Fever. If an infection is allowed to progress, a collection of pus (abscess) may develop. DIAGNOSIS  Your health care provider can usually diagnose mastitis based on your symptoms and a physical exam. Tests may be done to help confirm the diagnosis. These may include:   Removal of pus from the breast by applying pressure to the area. This pus can be examined in the lab to determine which bacteria are present. If an abscess has developed, the fluid in the abscess can be removed with a needle. This can also be used to confirm the diagnosis and determine the bacteria present. In most cases, pus will not be present.  Blood tests to determine if your body is fighting a bacterial infection.  Mammogram or ultrasound tests to rule out other problems or diseases. TREATMENT  Antibiotic medicine is used to treat a bacterial infection. Your health care provider will determine  which bacteria are most likely causing the infection and will select an appropriate antibiotic. This is sometimes changed based on the results of tests performed to identify the bacteria, or if there is no response to the antibiotic selected. Antibiotics are usually given by mouth. You may also be given medicine for pain. Mastitis that occurs with breastfeeding will sometimes go away on its own, so your health care provider may choose to wait 24 hours after first seeing you to decide whether a prescription medicine is needed. HOME CARE INSTRUCTIONS   Only take over-the-counter or prescription medicines for pain, fever, or discomfort as directed by your health care provider.  If your health care provider prescribed an antibiotic, take the medicine as directed. Make sure you finish it even if you start to feel better.  Do not wear a tight or underwire bra. Wear a soft, supportive bra.  Increase your fluid intake, especially if you have a fever.  Women who are breastfeeding should follow these instructions:  Continue to empty the breast. Your health care provider can tell you whether this milk is safe for your infant or needs to be thrown out. You may be told to stop nursing until your health care provider thinks it is safe for your baby. Use a breast pump if you are advised to stop nursing.  Keep your nipples clean and dry.  Empty the first breast completely before going to the other breast. If your baby is not emptying your breasts  completely for some reason, use a breast pump to empty your breasts.  If you go back to work, pump your breasts while at work to stay in time with your nursing schedule.  Avoid allowing your breasts to become overly filled with milk (engorged). SEEK MEDICAL CARE IF:   You have pus-like discharge from the breast.  Your symptoms do not improve with the treatment prescribed by your health care provider within 2 days. SEEK IMMEDIATE MEDICAL CARE IF:   Your pain  and swelling are getting worse.  You have pain that is not controlled with medicine.  You have a red line extending from the breast toward your armpit.  You have a fever or persistent symptoms for more than 2-3 days.  You have a fever and your symptoms suddenly get worse.   This information is not intended to replace advice given to you by your health care provider. Make sure you discuss any questions you have with your health care provider.   Document Released: 06/30/2005 Document Revised: 07/05/2013 Document Reviewed: 01/28/2013 Elsevier Interactive Patient Education [employer]2016 [employer]vier Inc.

## 2016-01-11 NOTE — Progress Notes (Signed)
Pt discharged to home with husband.  Condition stable.  Pt to car via wheelchair with C. Riley, NT.  No equipment for home ordered at discharge. 

## 2016-01-14 ENCOUNTER — Encounter: Payer: Self-pay | Admitting: Physician Assistant

## 2016-01-14 ENCOUNTER — Ambulatory Visit (INDEPENDENT_AMBULATORY_CARE_PROVIDER_SITE_OTHER): Payer: Medicaid Other | Admitting: Physician Assistant

## 2016-01-14 VITALS — BP 115/72 | HR 82 | Ht 64.0 in | Wt 187.6 lb

## 2016-01-14 DIAGNOSIS — I319 Disease of pericardium, unspecified: Secondary | ICD-10-CM | POA: Diagnosis not present

## 2016-01-14 DIAGNOSIS — O1495 Unspecified pre-eclampsia, complicating the puerperium: Secondary | ICD-10-CM

## 2016-01-14 DIAGNOSIS — I3139 Other pericardial effusion (noninflammatory): Secondary | ICD-10-CM

## 2016-01-14 DIAGNOSIS — I309 Acute pericarditis, unspecified: Secondary | ICD-10-CM | POA: Diagnosis not present

## 2016-01-14 DIAGNOSIS — I313 Pericardial effusion (noninflammatory): Secondary | ICD-10-CM

## 2016-01-14 NOTE — Patient Instructions (Signed)
Medication Instructions:  Your physician has recommended you make the following change in your medication:  1. Continue colchicine through end of September for a total of 3 months. Than Stop. 2. Taper down ibuprofen over the next week.   Labwork: -None  Testing/Procedures: -None  Follow-Up: Your physician wants you to follow-up in: 3 months with Dr. Duke Salviaandolph.  You will receive a reminder letter in the mail two months in advance. If you don't receive a letter, please call our office to schedule the follow-up appointment.   Any Other Special Instructions Will Be Listed Below (If Applicable).     If you need a refill on your cardiac medications before your next appointment, please call your pharmacy.

## 2016-01-18 ENCOUNTER — Ambulatory Visit: Payer: Medicaid Other | Admitting: *Deleted

## 2016-01-18 VITALS — BP 101/72 | HR 86

## 2016-01-18 DIAGNOSIS — Z0130 Encounter for examination of blood pressure without abnormal findings: Secondary | ICD-10-CM

## 2016-01-18 NOTE — Progress Notes (Signed)
Pt in for BP Check results are 101/72. Pt advised to keep pp visit in August. Patient had no further questions or concerns.

## 2016-02-21 ENCOUNTER — Ambulatory Visit: Payer: Medicaid Other | Admitting: Obstetrics & Gynecology

## 2016-03-05 ENCOUNTER — Telehealth: Payer: Self-pay | Admitting: *Deleted

## 2016-03-05 NOTE — Telephone Encounter (Signed)
Pt left message yesterday stating that she needs a medication refill.  She also stated that she saw the doctor for her chest pain and heart and was told to see a chiropractor. She has been seeing the chiropractor for 2 weeks and says he needs a note from the doctor.

## 2016-03-11 NOTE — Telephone Encounter (Signed)
No answer or voicemail to leave a message. 

## 2016-08-17 ENCOUNTER — Encounter (HOSPITAL_COMMUNITY): Payer: Self-pay | Admitting: Emergency Medicine

## 2016-08-17 ENCOUNTER — Emergency Department (HOSPITAL_COMMUNITY): Payer: Self-pay

## 2016-08-17 ENCOUNTER — Emergency Department (HOSPITAL_COMMUNITY)
Admission: EM | Admit: 2016-08-17 | Discharge: 2016-08-17 | Disposition: A | Discharge status: Home / Self Care | Payer: Self-pay | Attending: Emergency Medicine | Admitting: Emergency Medicine

## 2016-08-17 DIAGNOSIS — R079 Chest pain, unspecified: Secondary | ICD-10-CM | POA: Insufficient documentation

## 2016-08-17 LAB — BASIC METABOLIC PANEL
Anion gap: 3 — ABNORMAL LOW (ref 5–15)
BUN: 12 mg/dL (ref 6–20)
CALCIUM: 8.7 mg/dL — AB (ref 8.9–10.3)
CHLORIDE: 107 mmol/L (ref 101–111)
CO2: 27 mmol/L (ref 22–32)
CREATININE: 0.56 mg/dL (ref 0.44–1.00)
GFR calc Af Amer: >60 mL/min (ref >60)
GFR calc non Af Amer: >60 mL/min (ref >60)
GLUCOSE: 114 mg/dL — AB (ref 65–99)
Potassium: 3.3 mmol/L — ABNORMAL LOW (ref 3.5–5.1)
Sodium: 137 mmol/L (ref 135–145)

## 2016-08-17 LAB — CBC
HCT: 31.6 % — ABNORMAL LOW (ref 36.0–46.0)
Hemoglobin: 11.4 g/dL — ABNORMAL LOW (ref 12.0–15.0)
MCH: 26.1 pg (ref 26.0–34.0)
MCHC: 36.1 g/dL — AB (ref 30.0–36.0)
MCV: 72.3 fL — AB (ref 78.0–100.0)
PLATELETS: 326 10*3/uL (ref 150–400)
RBC: 4.37 MIL/uL (ref 3.87–5.11)
RDW: 13.3 % (ref 11.5–15.5)
WBC: 5.2 10*3/uL (ref 4.0–10.5)

## 2016-08-17 LAB — I-STAT TROPONIN, ED: TROPONIN I, POC: 0 ng/mL (ref 0.00–0.08)

## 2016-08-17 LAB — I-STAT BETA HCG BLOOD, ED (MC, WL, AP ONLY): I-stat hCG, quantitative: <5 m[IU]/mL (ref <5)

## 2016-08-17 MED ORDER — IBUPROFEN 800 MG PO TABS
800.0000 mg | ORAL_TABLET | Freq: Four times a day (QID) | ORAL | 0 refills | Status: DC | PRN
Start: 2016-08-17 — End: 2017-04-04

## 2016-08-17 MED ORDER — GI COCKTAIL ~~LOC~~
30.0000 mL | Freq: Once | ORAL | Status: AC
  Administered 2016-08-17: 30 mL via ORAL
  Filled 2016-08-17: qty 30

## 2016-08-17 NOTE — ED Triage Notes (Signed)
Pt c/o midline chest pain radiating to back onset 2 weeks ago with dizziness and headache. Denies N/V/D.

## 2016-08-17 NOTE — Discharge Instructions (Signed)
You have been seen in the Emergency Department (ED) today for chest pain.  As we have discussed today?s test results are normal, but you may require further testing.  You will need to speak with your Primary Care doctor about possible stress test and repeat ECHO cardiogram with history of fluid around the heart.   Please follow up with the recommended doctor as instructed above in these documents regarding today?s emergent visit and your recent symptoms to discuss further management.  Continue to take your regular medications.   Return to the Emergency Department (ED) if you experience any further chest pain/pressure/tightness, difficulty breathing, or sudden sweating, or other symptoms that concern you.

## 2016-08-17 NOTE — ED Provider Notes (Signed)
Emergency Department Provider Note   I have reviewed the triage vital signs and the nursing notes.   HISTORY  Chief Complaint Chest Pain   HPI Anna Walls is a 36 y.o. female with PMH of pericarditis, sickle cell trait, and prior small pericardial effusion presents to the emergency department for evaluation of chest pain for the past 2 weeks. She describes constant pain that is dull and aching in quality. She describes as moderate severity. The pain radiates to her back. She has sensation of discomfort in her "bones and joints." She denies prior history of similar pain. She did have chest pain during pregnancy that was attributed to pericarditis and a trace pericardial effusion. No worsening with sitting forward, palpation, or movement. No pleuritic pain. No associated fever, pulse, nausea, vomiting, diarrhea. She's been taking naproxen and Tylenol with no relief in symptoms. She continues her Prilosec nightly.   Past Medical History:  Diagnosis Date  . Chronic headaches   . Hyperemesis   . Low back pain   . Pericardial effusion    a. Trace by echo 12/2015.  Marland Kitchen Pericarditis    a. Post-partum 12/2015.  Marland Kitchen Preeclampsia in postpartum period   . Sickle cell trait [facility])     Patient Active Problem List   Diagnosis Date Noted  . Mastitis, right, acute 01/08/2016  . Postoperative pulmonary edema (HCC) 01/07/2016  . Chest pain at rest   . Shortness of breath   . Acute pericarditis   . Preeclampsia in postpartum period 01/06/2016  . Supervision of high risk pregnancy, antepartum 06/25/2015    History reviewed. No pertinent surgical history.  Current Outpatient Rx  . Order #: [id number] Class: Normal  . Order #: [id number] Class: Normal  . Order #: [id number] Class: Normal  . Order #: [id number] Class: Print  . Order #: [id number] Class: Normal  . Order #: [id number] Class: Normal  . Order #: [id number] Class: Normal  . Order #: [id number] Class: Normal  . Order #: [id number] Class: Normal      Allergies Patient has no known allergies.  Family History  Problem Relation Age of Onset  . Family history unknown: Yes    Social History Social History  Substance Use Topics  . Smoking status: Never Smoker  . Smokeless tobacco: Never Used  . Alcohol use No    Review of Systems  Constitutional: No fever/chills Eyes: No visual changes. ENT: No sore throat. Cardiovascular: Positive chest pain. Respiratory: Denies shortness of breath. Gastrointestinal: No abdominal pain.  No nausea, no vomiting.  No diarrhea.  No constipation. Genitourinary: Negative for dysuria. Musculoskeletal: Negative for back pain. Skin: Negative for rash. Neurological: Negative for headaches, focal weakness or numbness.  10-point ROS otherwise negative.  ____________________________________________   PHYSICAL EXAM:  VITAL SIGNS: ED Triage Vitals  Enc Vitals Group     BP 08/17/16 1417 113/77     Pulse Rate 08/17/16 1417 72     Resp 08/17/16 1417 14     Temp 08/17/16 1417 97.4 F (36.3 C)     Temp Source 08/17/16 1417 Oral     SpO2 08/17/16 1417 98 %     Weight 08/17/16 1418 200 lb (90.7 kg)     Height 08/17/16 1418 5\' 4"  (1.626 m)     Pain Score 08/17/16 1418 10   Constitutional: Alert and oriented. Well appearing and in no acute distress. Eyes: Conjunctivae are normal.  Head: Atraumatic. Nose: No congestion/rhinnorhea. Mouth/Throat: Mucous membranes are moist.  Oropharynx non-erythematous. Neck: No stridor.   Cardiovascular: Normal rate, regular  rhythm. Good peripheral circulation. Grossly normal heart sounds.   Respiratory: Normal respiratory effort.  No retractions. Lungs CTAB. Gastrointestinal: Soft and nontender. No distention.  Musculoskeletal: No lower extremity tenderness nor edema. No gross deformities of extremities. Neurologic:  Normal speech and language. No gross focal neurologic deficits are appreciated.  Skin:  Skin is warm, dry and intact. No rash  noted.  ____________________________________________   LABS (all labs ordered are listed, but only abnormal results are displayed)  Labs Reviewed  BASIC METABOLIC PANEL - Abnormal; Notable for the following:       Result Value   Potassium 3.3 (*)    Glucose, Bld 114 (*)    Calcium 8.7 (*)    Anion gap 3 (*)    All other components within normal limits  CBC - Abnormal; Notable for the following:    Hemoglobin 11.4 (*)    HCT 31.6 (*)    MCV 72.3 (*)    MCHC 36.1 (*)    All other components within normal limits  I-STAT TROPOININ, ED  I-STAT BETA HCG BLOOD, ED ([facility], [facility], [facility] ONLY)   ____________________________________________  EKG   EKG Interpretation  Date/Time:  Sunday August 17 2016 14:15:47 EST Ventricular Rate:  77 PR Interval:    QRS Duration: 92 QT Interval:  372 QTC Calculation: 421 R Axis:   49 Text Interpretation:  Sinus rhythm No STEMI.  Confirmed by Trystan Eads MD, Jovin Fester [id number]) on 08/17/2016 4:07:27 PM       ____________________________________________  RADIOLOGY  Dg Chest 2 View  Result Date: 08/17/2016 CLINICAL DATA:  36 year old female with midline chest pain radiating to the back with dizziness and headache. Symptom onset 2 weeks ago. Initial encounter. EXAM: CHEST  2 VIEW COMPARISON:  Chest CTA 01/06/2016 and earlier. FINDINGS: The lung volumes are within normal limits. Stable mild elevation of the right hemidiaphragm. Stable cardiac size at the upper limits of normal. Other mediastinal contours are within normal limits. Visualized tracheal air column is within normal limits. No pneumothorax, pulmonary edema, pleural effusion or confluent pulmonary opacity. No acute osseous abnormality identified. Negative visible bowel gas pattern. IMPRESSION: No acute cardiopulmonary abnormality. Borderline to mild cardiomegaly. Electronically Signed   By: Odessa FlemingH  Hall M.D.   On: 08/17/2016 14:53    ____________________________________________   PROCEDURES  Procedure(s)  performed:   Procedures  None ____________________________________________   INITIAL IMPRESSION / ASSESSMENT AND PLAN / ED COURSE  Pertinent labs & imaging results that were available during my care of the patient were reviewed by me and considered in my medical decision making (see chart for details).  Patient resents to the emergency department for evaluation of constant chest pain for the past 2 weeks. She has associated back and joint discomfort. PERC negative with very low suspicion for PE. EKG is unremarkable. Troponin is negative. No indication for trending with 2 weeks of constant pain. CXR negative. Plan for GI cocktail and PCP/Cardiology follow up for further rheumatological testing and consideration of formal repeat ECHO to reassess for pericardial effusion.  04:27 PM Patient with slight improvement. Will discharge home on NSAIDs and plan for PCP follow up and repeat ECHO. Discussed this plan in detail with the patient at bedside.   At this time, I do not feel there is any life-threatening condition present. I have reviewed and discussed all results (EKG, imaging, lab, urine as appropriate), exam findings with patient. I have reviewed nursing notes and appropriate previous records.  I feel the patient is safe to be discharged  home without further emergent workup. Discussed usual and customary return precautions. Patient and family (if present) verbalize understanding and are comfortable with this plan.  Patient will follow-up with their primary care provider. If they do not have a primary care provider, information for follow-up has been provided to them. All questions have been answered.   ____________________________________________  FINAL CLINICAL IMPRESSION(S) / ED DIAGNOSES  Final diagnoses:  Nonspecific chest pain     MEDICATIONS GIVEN DURING THIS VISIT:  Medications  gi cocktail (Maalox,Lidocaine,Donnatal) (30 mLs Oral Given 08/17/16 1619)     NEW OUTPATIENT  MEDICATIONS STARTED DURING THIS VISIT:  New Prescriptions   IBUPROFEN (ADVIL,MOTRIN) 800 MG TABLET    Take 1 tablet (800 mg total) by mouth every 6 (six) hours as needed.      Note:  This document was prepared using Dragon voice recognition software and may include unintentional dictation errors.  Alona Bene, MD Emergency Medicine   Maia Plan, MD 08/17/16 1630

## 2017-04-03 ENCOUNTER — Encounter (HOSPITAL_COMMUNITY): Payer: Self-pay

## 2017-04-03 ENCOUNTER — Emergency Department (HOSPITAL_COMMUNITY)
Admission: EM | Admit: 2017-04-03 | Discharge: 2017-04-04 | Disposition: A | Discharge status: Home / Self Care | Payer: Self-pay | Attending: Emergency Medicine | Admitting: Emergency Medicine

## 2017-04-03 DIAGNOSIS — M25561 Pain in right knee: Secondary | ICD-10-CM | POA: Insufficient documentation

## 2017-04-03 DIAGNOSIS — R519 Headache, unspecified: Secondary | ICD-10-CM

## 2017-04-03 DIAGNOSIS — M25562 Pain in left knee: Secondary | ICD-10-CM | POA: Insufficient documentation

## 2017-04-03 DIAGNOSIS — Z79899 Other long term (current) drug therapy: Secondary | ICD-10-CM | POA: Insufficient documentation

## 2017-04-03 DIAGNOSIS — R51 Headache: Secondary | ICD-10-CM | POA: Insufficient documentation

## 2017-04-03 DIAGNOSIS — L299 Pruritus, unspecified: Secondary | ICD-10-CM | POA: Insufficient documentation

## 2017-04-03 LAB — CBC WITH DIFFERENTIAL/PLATELET
BASOS PCT: 0 %
Basophils Absolute: 0 10*3/uL (ref 0.0–0.1)
Eosinophils Absolute: 0.4 10*3/uL (ref 0.0–0.7)
Eosinophils Relative: 6 %
HEMATOCRIT: 35.3 % — AB (ref 36.0–46.0)
HEMOGLOBIN: 12.8 g/dL (ref 12.0–15.0)
LYMPHS ABS: 3.1 10*3/uL (ref 0.7–4.0)
Lymphocytes Relative: 42 %
MCH: 26.2 pg (ref 26.0–34.0)
MCHC: 36.3 g/dL — AB (ref 30.0–36.0)
MCV: 72.3 fL — ABNORMAL LOW (ref 78.0–100.0)
MONO ABS: 0.3 10*3/uL (ref 0.1–1.0)
MONOS PCT: 4 %
NEUTROS ABS: 3.5 10*3/uL (ref 1.7–7.7)
NEUTROS PCT: 48 %
Platelets: 396 10*3/uL (ref 150–400)
RBC: 4.88 MIL/uL (ref 3.87–5.11)
RDW: 14.5 % (ref 11.5–15.5)
WBC: 7.3 10*3/uL (ref 4.0–10.5)

## 2017-04-03 LAB — COMPREHENSIVE METABOLIC PANEL
ALBUMIN: 3.7 g/dL (ref 3.5–5.0)
ALK PHOS: 60 U/L (ref 38–126)
ALT: 13 U/L — AB (ref 14–54)
ANION GAP: 9 (ref 5–15)
AST: 13 U/L — ABNORMAL LOW (ref 15–41)
BUN: 9 mg/dL (ref 6–20)
CALCIUM: 8.8 mg/dL — AB (ref 8.9–10.3)
CHLORIDE: 106 mmol/L (ref 101–111)
CO2: 24 mmol/L (ref 22–32)
CREATININE: 0.6 mg/dL (ref 0.44–1.00)
GFR calc non Af Amer: >60 mL/min (ref >60)
GLUCOSE: 93 mg/dL (ref 65–99)
Potassium: 3.5 mmol/L (ref 3.5–5.1)
SODIUM: 139 mmol/L (ref 135–145)
Total Bilirubin: 0.6 mg/dL (ref 0.3–1.2)
Total Protein: 7.6 g/dL (ref 6.5–8.1)

## 2017-04-03 LAB — PREGNANCY, URINE: Preg Test, Ur: NEGATIVE

## 2017-04-03 MED ORDER — METOCLOPRAMIDE HCL 5 MG/ML IJ SOLN
10.0000 mg | Freq: Once | INTRAMUSCULAR | Status: AC
  Administered 2017-04-03: 10 mg via INTRAVENOUS
  Filled 2017-04-03: qty 2

## 2017-04-03 MED ORDER — DEXAMETHASONE SODIUM PHOSPHATE 10 MG/ML IJ SOLN
10.0000 mg | Freq: Once | INTRAMUSCULAR | Status: AC
  Administered 2017-04-03: 10 mg via INTRAVENOUS
  Filled 2017-04-03: qty 1

## 2017-04-03 MED ORDER — KETOROLAC TROMETHAMINE 30 MG/ML IJ SOLN
30.0000 mg | Freq: Once | INTRAMUSCULAR | Status: AC
  Administered 2017-04-03: 30 mg via INTRAVENOUS
  Filled 2017-04-03: qty 1

## 2017-04-03 MED ORDER — DIPHENHYDRAMINE HCL 50 MG/ML IJ SOLN
25.0000 mg | Freq: Once | INTRAMUSCULAR | Status: AC
  Administered 2017-04-03: 25 mg via INTRAVENOUS
  Filled 2017-04-03: qty 1

## 2017-04-03 MED ORDER — SODIUM CHLORIDE 0.9 % IV BOLUS (SEPSIS)
1000.0000 mL | Freq: Once | INTRAVENOUS | Status: AC
  Administered 2017-04-03: 1000 mL via INTRAVENOUS

## 2017-04-03 NOTE — ED Provider Notes (Signed)
Sign out from Lyndel Safe, PA-C at shift change  Hx of migraines; headache for past 4 days on and off, typical of normal migraines  Fluids, decadron, Toradol, Reglan, Benadryl initiated. Labs unremarkable. UA pending.   Plan to reevaluate after meds. D/c home with PCP follow up if headache improved.  After reevaluation following IV fluids, Decadron, Toradol, Reglan, Benadryl, patient's headache has improved. Patient requesting something for her headache at home. I gave short course of Fioricet. Patient advised to follow up and establish care with PCP. She is given resources and social work consult placed to help her make contact, as she has tried multiple times to contact patient care clinic. Return precautions discussed. Patient understands and agrees with plan. Patient vitals stable throughout ED course and discharged in satisfactory condition.   Emi Holes, PA-C 04/04/17 4098    Cathren Laine, MD 04/07/17 [phone]

## 2017-04-03 NOTE — ED Provider Notes (Signed)
[facility]-EMERGENCY DEPT Provider Note   CSN: [id number] Arrival date & time: 04/03/17  1732     History   Chief Complaint Chief Complaint  Patient presents with  . Migraine  . Knee Pain  . Pruritis    HPI Anna Walls is a 36 y.o. female with a history of sickle cell trait, chronic headaches, who presents today for evaluation of 4 days of head pain. She reports that her headache was initially on the right side, however is now on the left side by her temples, doesn't cross the top of her head and into the back of her head. She says that the light bothers her head, along with sounds. She denies any trauma. She has taken [facility] powders for her headache and reports that that will temporarily provide relief. She denies any recent trauma. She says that her headache comes and goes.  She has had similar headaches in the past. She has never seen a neurologist for her headaches.  She also presents for concern of bilateral knee pain, she says that this is been present for the past 4 weeks, and that she is unable to fully bend her right knee secondary to pain. She reports that the [facility] powders helped this pain.    She also reports that for the past few days her face has been feeling itchy. She has been trying hydrocortisone cream however has not had any relief.  HPI  Past Medical History:  Diagnosis Date  . Chronic headaches   . Hyperemesis   . Low back pain   . Pericardial effusion    a. Trace by echo 12/2015.  Marland Kitchen Pericarditis    a. Post-partum 12/2015.  Marland Kitchen Preeclampsia in postpartum period   . Sickle cell trait [facility])     Patient Active Problem List   Diagnosis Date Noted  . Mastitis, right, acute 01/08/2016  . Postoperative pulmonary edema (HCC) 01/07/2016  . Chest pain at rest   . Shortness of breath   . Acute pericarditis   . Preeclampsia in postpartum period 01/06/2016  . Supervision of high risk pregnancy, antepartum 06/25/2015    History reviewed. No pertinent surgical  history.  OB History    Gravida Para Term Preterm AB Living   SAB TAB Ectopic Multiple Live Births         0 4       Home Medications    Prior to Admission medications   Medication Sig Start Date End Date Taking? Authorizing Provider  Aspirin-Salicylamide-Caffeine (BC HEADACHE PO) Take 1 packet by mouth daily as needed (pain).   Yes [provider]  glucosamine-chondroitin 500-400 MG tablet Take 1 tablet by mouth 2 (two) times daily.   Yes [provider]  colchicine 0.6 MG tablet Take 1 tablet (0.6 mg total) by mouth daily. Patient not taking: Reported on 04/03/2017 01/11/16   Levie Heritage, DO  dicloxacillin (DYNAPEN) 500 MG capsule Take 1 capsule (500 mg total) by mouth every 6 (six) hours. Patient not taking: Reported on 04/03/2017 01/11/16   Levie Heritage, DO  ibuprofen (ADVIL,MOTRIN) 600 MG tablet Take 1 tablet (600 mg total) by mouth every 6 (six) hours. Patient not taking: Reported on 04/03/2017 12/29/15   Federico Flake, MD  ibuprofen (ADVIL,MOTRIN) 800 MG tablet Take 1 tablet (800 mg total) by mouth every 6 (six) hours as needed. Patient not taking: Reported on 04/03/2017 08/17/16   Long, Arlyss Repress, MD  NIFEdipine (PROCARDIA-XL/ADALAT CC) 30 MG 24 hr tablet Take 1 tablet (30 mg total) by mouth 2 (two) times daily. Patient not taking: Reported on 04/03/2017 01/11/16   Levie Heritage, DO  Prenatal Vit-Fe Fumarate-FA (PRENATAL COMPLETE) 14-0.4 MG TABS Take 1 tablet by mouth daily. Patient not taking: Reported on 04/03/2017 09/26/15   Leftwich-Kirby, Wilmer Floor, CNM  pseudoephedrine (SUDAFED) 30 MG tablet Take 1 tablet (30 mg total) by mouth daily. Patient not taking: Reported on 04/03/2017 01/11/16   Levie Heritage, DO  ranitidine (ZANTAC) 150 MG capsule Take 1 capsule (150 mg total) by mouth 2 (two) times daily. Patient not taking: Reported on 04/03/2017 12/19/15   Poe, Deirdre C, CNM  sulfamethoxazole-trimethoprim (BACTRIM DS,SEPTRA DS) 800-160 MG  tablet Take 1 tablet by mouth every 12 (twelve) hours. Patient not taking: Reported on 04/03/2017 01/11/16   Levie Heritage, DO    Family History Family History  Problem Relation Age of Onset  . Family history unknown: Yes    Social History Social History  Substance Use Topics  . Smoking status: Never Smoker  . Smokeless tobacco: Never Used  . Alcohol use No     Allergies   Patient has no known allergies.   Review of Systems Review of Systems  Constitutional: Negative for chills and fever.  HENT: Negative for ear pain and sore throat.        Face itching  Eyes: Positive for photophobia. Negative for pain and visual disturbance.  Respiratory: Negative for cough and shortness of breath.   Cardiovascular: Negative for chest pain and palpitations.  Gastrointestinal: Negative for abdominal pain, nausea and vomiting.  Genitourinary: Negative for dysuria and hematuria.  Musculoskeletal: Negative for arthralgias and back pain.  Skin: Negative for color change and rash.  Neurological: Positive for headaches. Negative for dizziness, seizures, syncope, facial asymmetry, weakness, light-headedness and numbness.  All other systems reviewed and are negative.    Physical Exam Updated Vital Signs BP (!) 126/97 (BP Location: Left Arm)   Pulse 74   Temp 99 F (37.2 C) (Oral)   Resp 18   Ht  (1.626 m)   Wt 92.7 kg (204 lb 4.8 oz)   LMP 04/02/2017   SpO2 100%   BMI 35.07 kg/m   Physical Exam  Constitutional: She is oriented to person, place, and time. She appears well-developed and well-nourished. No distress.  HENT:  Head: Normocephalic and atraumatic.  Mouth/Throat: Oropharynx is clear and moist.  Eyes: Conjunctivae are normal.  Neck: Neck supple.  Cardiovascular: Normal rate and regular rhythm.   No murmur heard. Pulmonary/Chest: Effort normal and breath sounds normal. No respiratory distress.  Abdominal: Soft. There is no tenderness.  Musculoskeletal: She  exhibits no edema.  Neurological: She is alert and oriented to person, place, and time. No cranial nerve deficit or sensory deficit. She exhibits normal muscle tone.  Mental Status:  Alert, oriented, thought content appropriate, able to give a coherent history. Speech fluent without evidence of aphasia. Able to follow 2 step commands without difficulty.  Cranial Nerves:  II:  Peripheral visual fields grossly normal, pupils equal, round, reactive to light III,IV, VI: ptosis not present, extra-ocular motions intact bilaterally  V,VII: smile symmetric VIII: hearing grossly normal to voice  X: uvula elevates symmetrically  XI: bilateral shoulder shrug symmetric and strong XII: midline tongue extension without fassiculations Motor:  Normal tone. 5/5 in upper and lower extremities bilaterally including strong and equal grip strength and dorsiflexion/plantar flexion Cerebellar: normal finger-to-nose with  bilateral upper extremities CV: distal pulses palpable throughout    Skin: Skin is warm and dry.  Psychiatric: She has a normal mood and affect.  Nursing note and vitals reviewed.    ED Treatments / Results  Labs (all labs ordered are listed, but only abnormal results are displayed) Labs Reviewed  COMPREHENSIVE METABOLIC PANEL - Abnormal; Notable for the following:       Result Value   Calcium 8.8 (*)    AST 13 (*)    ALT 13 (*)    All other components within normal limits  CBC WITH DIFFERENTIAL/PLATELET - Abnormal; Notable for the following:    HCT 35.3 (*)    MCV 72.3 (*)    MCHC 36.3 (*)    All other components within normal limits  PREGNANCY, URINE  URINALYSIS, ROUTINE W REFLEX MICROSCOPIC    EKG  EKG Interpretation None       Radiology No results found.  Procedures Procedures (including critical care time)  Medications Ordered in ED Medications  ketorolac (TORADOL) 30 MG/ML injection 30 mg (not administered)  dexamethasone (DECADRON) injection 10 mg (not  administered)  sodium chloride 0.9 % bolus 1,000 mL (1,000 mLs Intravenous New Bag/Given 04/03/17 2218)  metoCLOPramide (REGLAN) injection 10 mg (10 mg Intravenous Given 04/03/17 2217)  diphenhydrAMINE (BENADRYL) injection 25 mg (25 mg Intravenous Given 04/03/17 2218)     Initial Impression / Assessment and Plan / ED Course  I have reviewed the triage vital signs and the nursing notes.  Pertinent labs & imaging results that were available during my care of the patient were reviewed by me and considered in my medical decision making (see chart for details).    Anna Walls presents for evaluation of a headace which she states is like her normal migraines.   Presentation is like pts typical HA and non concerning for [facility], ICH, Meningitis, or temporal arteritis. Pt is afebrile with no focal neuro deficits, nuchal rigidity, or change in vision.   At shift change care was transferred to [facility] PA-C who will follow pending studies, re-evaulate and determine disposition.     Final Clinical Impressions(s) / ED Diagnoses   Final diagnoses:  None    New Prescriptions New Prescriptions   No medications on file     Norman Clay 04/03/17 2343    Lavera Guise, MD 04/04/17 [phone]

## 2017-04-03 NOTE — ED Triage Notes (Signed)
Patient c/o migraine x 4 days. Patient has sensitivity to light and sound and c/o nausea. Patient also c/o bilateral knee pain, R> L. Patient states she has taken [facility] powders for her headache.  Patient also c/o facial itching and has been hydrocortisone cream OTC with no relief.

## 2017-04-04 LAB — URINALYSIS, MICROSCOPIC (REFLEX)

## 2017-04-04 LAB — URINALYSIS, ROUTINE W REFLEX MICROSCOPIC

## 2017-04-04 MED ORDER — IBUPROFEN 600 MG PO TABS: 600.0000 mg | ORAL_TABLET | Freq: Four times a day (QID) | ORAL | 0 refills | Status: DC

## 2017-04-04 MED ORDER — BUTALBITAL-APAP-CAFFEINE 50-325-40 MG PO TABS: 1.0000 | ORAL_TABLET | Freq: Four times a day (QID) | ORAL | 0 refills | Status: AC | PRN

## 2017-04-04 NOTE — Discharge Instructions (Signed)
Medications: Fioricet  Treatment: Take 1-2 Fioricet every 4-6 hours as needed for headache. You can take ibuprofen as prescribed over-the-counter, as needed for your knee pain and/or headache.  Follow-up: Please follow-up with the [facility] to establish care with primary care provider. A social worker should also be calling you to help with this. Please return to emergency department if you develop any new or worsening symptoms.

## 2017-04-04 NOTE — ED Notes (Signed)
PT DISCHARGED. INSTRUCTIONS AND PRESCRIPTIONS GIVEN. AAOX4. PT IN NO APPARENT DISTRESS OR PAIN. THE OPPORTUNITY TO ASK QUESTIONS WAS PROVIDED. 

## 2017-04-13 ENCOUNTER — Encounter (HOSPITAL_COMMUNITY): Payer: Self-pay | Admitting: Family Medicine

## 2017-04-13 ENCOUNTER — Emergency Department (HOSPITAL_COMMUNITY)
Admission: EM | Admit: 2017-04-13 | Discharge: 2017-04-13 | Disposition: A | Discharge status: Home / Self Care | Payer: Self-pay | Attending: Emergency Medicine | Admitting: Emergency Medicine

## 2017-04-13 DIAGNOSIS — R51 Headache: Secondary | ICD-10-CM | POA: Insufficient documentation

## 2017-04-13 DIAGNOSIS — Z5321 Procedure and treatment not carried out due to patient leaving prior to being seen by health care provider: Secondary | ICD-10-CM | POA: Insufficient documentation

## 2017-04-13 NOTE — ED Triage Notes (Signed)
Patient reports she is experiencing a headache that started on 04/03/2017. She reports she was seen at [facility] ED for the headache but it has continued. Patient reports the pain radiates down into her neck but she is able to rotate her neck without difficulties. Also, reports she is nausea but has not vomited. Patient is ambulatory with a steady gait.

## 2017-04-13 NOTE — ED Notes (Signed)
Pt c/o a migraine headache that is in the back of her head radiating down her neck that was different than when she was seen for migraines last week. Also, c/o itchiness in her face. No rash noticed.

## 2017-05-06 ENCOUNTER — Emergency Department (HOSPITAL_COMMUNITY)
Admission: EM | Admit: 2017-05-06 | Discharge: 2017-05-06 | Disposition: A | Discharge status: Home / Self Care | Payer: Self-pay | Attending: Emergency Medicine | Admitting: Emergency Medicine

## 2017-05-06 ENCOUNTER — Encounter (HOSPITAL_COMMUNITY): Payer: Self-pay | Admitting: *Deleted

## 2017-05-06 ENCOUNTER — Emergency Department (HOSPITAL_COMMUNITY): Payer: Self-pay

## 2017-05-06 DIAGNOSIS — R51 Headache: Secondary | ICD-10-CM | POA: Insufficient documentation

## 2017-05-06 DIAGNOSIS — H7291 Unspecified perforation of tympanic membrane, right ear: Secondary | ICD-10-CM | POA: Insufficient documentation

## 2017-05-06 DIAGNOSIS — R519 Headache, unspecified: Secondary | ICD-10-CM | POA: Diagnosis present

## 2017-05-06 DIAGNOSIS — J029 Acute pharyngitis, unspecified: Secondary | ICD-10-CM

## 2017-05-06 DIAGNOSIS — G8929 Other chronic pain: Secondary | ICD-10-CM | POA: Diagnosis present

## 2017-05-06 DIAGNOSIS — Z79899 Other long term (current) drug therapy: Secondary | ICD-10-CM | POA: Insufficient documentation

## 2017-05-06 DIAGNOSIS — J069 Acute upper respiratory infection, unspecified: Secondary | ICD-10-CM | POA: Insufficient documentation

## 2017-05-06 MED ORDER — BENZONATATE 100 MG PO CAPS: 100.0000 mg | ORAL_CAPSULE | Freq: Three times a day (TID) | ORAL | 0 refills | Status: DC | PRN

## 2017-05-06 MED ORDER — DEXAMETHASONE SODIUM PHOSPHATE 10 MG/ML IJ SOLN
10.0000 mg | Freq: Once | INTRAMUSCULAR | Status: AC
  Administered 2017-05-06: 10 mg via INTRAMUSCULAR
  Filled 2017-05-06: qty 1

## 2017-05-06 MED ORDER — NEOMYCIN-POLYMYXIN-HC 3.5-10000-1 OT SUSP
4.0000 [drp] | Freq: Four times a day (QID) | OTIC | Status: DC
  Administered 2017-05-06: 4 [drp] via OTIC
  Filled 2017-05-06: qty 10

## 2017-05-06 MED ORDER — NAPROXEN 500 MG PO TABS: 500.0000 mg | ORAL_TABLET | Freq: Two times a day (BID) | ORAL | 0 refills | Status: AC | PRN

## 2017-05-06 NOTE — Discharge Instructions (Signed)
Please obtain a primary care provider.  Today I am giving you a prescription for naproxen.  Naproxen is related to ibuprofen.  Please do not take ibuprofen, Aleve, Advil, or Motrin while taking naproxen.  You may take Tylenol as needed.

## 2017-05-06 NOTE — ED Notes (Signed)
ED Provider at bedside. 

## 2017-05-06 NOTE — ED Triage Notes (Signed)
PT here for sinus drainage, sore throat and productive cough x 2 weeks.  Denies fevers.

## 2017-05-06 NOTE — ED Notes (Signed)
Patient transported to X-ray 

## 2017-05-06 NOTE — ED Provider Notes (Signed)
[facility] EMERGENCY DEPARTMENT Provider Note   CSN: [id number] Arrival date & time: 05/06/17  5284     History   Chief Complaint Chief Complaint  Patient presents with  . URI    HPI Anna Walls is a 36 y.o. female who presents today for evaluation of 1 week of sore throat, and productive cough.  She says that it has been staying the same for the past week and she is not getting better.  She reports that she also has a sore throat along with pain in her right ear.  She has been coughing up which she believes to be phlegm.  She denies any blood.  She does report occasional chills, no fevers.  No nausea, vomiting, or diarrhea.  She does report that she has a headache on both temples.  This is her normal nonmigraine headache location and is not concerning to her.    HPI  Past Medical History:  Diagnosis Date  . Chronic headaches   . Hyperemesis   . Low back pain   . Pericardial effusion    a. Trace by echo 12/2015.  Marland Kitchen Pericarditis    a. Post-partum 12/2015.  Marland Kitchen Preeclampsia in postpartum period   . Sickle cell trait [facility])     Patient Active Problem List   Diagnosis Date Noted  . Chronic nonintractable headache 05/06/2017  . Tympanic membrane rupture, right 05/06/2017  . Mastitis, right, acute 01/08/2016  . Postoperative pulmonary edema (HCC) 01/07/2016  . Chest pain at rest   . Shortness of breath   . Acute pericarditis   . Preeclampsia in postpartum period 01/06/2016  . Supervision of high risk pregnancy, antepartum 06/25/2015    History reviewed. No pertinent surgical history.  OB History    Gravida Para Term Preterm AB Living   5 5 5     4    SAB TAB Ectopic Multiple Live Births         0 4       Home Medications    Prior to Admission medications   Medication Sig Start Date End Date Taking? Authorizing Provider  Aspirin-Salicylamide-Caffeine (BC HEADACHE PO) Take 1 packet by mouth daily as needed (pain).    [provider]    benzonatate (TESSALON) 100 MG capsule Take 1 capsule (100 mg total) by mouth every 8 (eight) hours as needed for cough. 05/06/17   Cristina Gong, PA-C  butalbital-acetaminophen-caffeine [place], ESGIC) [phone] MG tablet Take 1-2 tablets by mouth every 6 (six) hours as needed for headache. 04/04/17 04/04/18  Law, Waylan Boga, PA-C  colchicine 0.6 MG tablet Take 1 tablet (0.6 mg total) by mouth daily. Patient not taking: Reported on 04/03/2017 01/11/16   Levie Heritage, DO  dicloxacillin (DYNAPEN) 500 MG capsule Take 1 capsule (500 mg total) by mouth every 6 (six) hours. Patient not taking: Reported on 04/03/2017 01/11/16   Levie Heritage, DO  glucosamine-chondroitin 500-400 MG tablet Take 1 tablet by mouth 2 (two) times daily.    [provider]  ibuprofen (ADVIL,MOTRIN) 600 MG tablet Take 1 tablet (600 mg total) by mouth every 6 (six) hours. 04/04/17   Law, Waylan Boga, PA-C  naproxen (NAPROSYN) 500 MG tablet Take 1 tablet (500 mg total) by mouth 2 (two) times daily as needed for headache. 05/06/17 05/16/17  Cristina Gong, PA-C  NIFEdipine (PROCARDIA-XL/ADALAT CC) 30 MG 24 hr tablet Take 1 tablet (30 mg total) by mouth 2 (two) times daily. Patient not taking: Reported on  04/03/2017 01/11/16   Levie Heritage, DO  Prenatal Vit-Fe Fumarate-FA (PRENATAL COMPLETE) 14-0.4 MG TABS Take 1 tablet by mouth daily. Patient not taking: Reported on 04/03/2017 09/26/15   Leftwich-Kirby, Wilmer Floor, CNM  pseudoephedrine (SUDAFED) 30 MG tablet Take 1 tablet (30 mg total) by mouth daily. Patient not taking: Reported on 04/03/2017 01/11/16   Levie Heritage, DO  ranitidine (ZANTAC) 150 MG capsule Take 1 capsule (150 mg total) by mouth 2 (two) times daily. Patient not taking: Reported on 04/03/2017 12/19/15   Poe, Deirdre C, CNM  sulfamethoxazole-trimethoprim (BACTRIM DS,SEPTRA DS) 800-160 MG tablet Take 1 tablet by mouth every 12 (twelve) hours. Patient not taking: Reported on 04/03/2017 01/11/16    Levie Heritage, DO    Family History Family History  Problem Relation Age of Onset  . Family history unknown: Yes    Social History Social History  Substance Use Topics  . Smoking status: Never Smoker  . Smokeless tobacco: Never Used  . Alcohol use No     Allergies   Patient has no known allergies.   Review of Systems Review of Systems  Constitutional: Positive for chills. Negative for fatigue and fever.  HENT: Positive for congestion, ear pain and sore throat. Negative for drooling, ear discharge, sinus pain, sinus pressure, trouble swallowing and voice change.   Eyes: Negative for discharge and redness.  Respiratory: Positive for cough. Negative for chest tightness and shortness of breath.   Cardiovascular: Negative for chest pain.  Gastrointestinal: Negative for abdominal pain, nausea and vomiting.  Neurological: Positive for headaches. Negative for facial asymmetry.     Physical Exam Updated Vital Signs BP (!) 142/103 (BP Location: Left Arm)   Pulse 72   Temp 98 F (36.7 C) (Oral)   Resp 18   Ht 5\' 4"  (1.626 m)   Wt 92.5 kg (204 lb)   LMP 04/06/2017   SpO2 96%   BMI 35.02 kg/m   Physical Exam  Constitutional: She is oriented to person, place, and time. She appears well-developed and well-nourished.  HENT:  Head: Normocephalic and atraumatic.  Right Ear: External ear and ear canal normal. Tympanic membrane is perforated.  Left Ear: Tympanic membrane, external ear and ear canal normal. Tympanic membrane is not perforated.  Nose: Mucosal edema present. No nose lacerations. Right sinus exhibits no maxillary sinus tenderness and no frontal sinus tenderness. Left sinus exhibits no maxillary sinus tenderness and no frontal sinus tenderness.  Mouth/Throat: Uvula is midline and mucous membranes are normal. No oral lesions. No oropharyngeal exudate or tonsillar abscesses. Tonsils are 3+ on the right. Tonsils are 3+ on the left. No tonsillar exudate.  Eyes: Pupils  are equal, round, and reactive to light. Conjunctivae and EOM are normal.  Neck: Normal range of motion. Neck supple. No JVD present.  Cardiovascular: Normal rate, regular rhythm and normal heart sounds.  Exam reveals no friction rub.   No murmur heard. Pulmonary/Chest: Effort normal and breath sounds normal. No stridor. No respiratory distress. She has no wheezes. She has no rales. She exhibits tenderness (Not painful at baseline, mildly tender over sternal costal joints to palpation.).  Lymphadenopathy:    She has cervical adenopathy.  Neurological: She is alert and oriented to person, place, and time.  No obvious facial droop.  Moves all extremities spontaneously.  Grossly neurologically intact.  Skin: Skin is warm and dry. She is not diaphoretic.  Psychiatric: She has a normal mood and affect. Her behavior is normal.  Nursing note and vitals  reviewed.    ED Treatments / Results  Labs (all labs ordered are listed, but only abnormal results are displayed) Labs Reviewed - No data to display  EKG  EKG Interpretation None       Radiology Dg Chest 2 View  Result Date: 05/06/2017 CLINICAL DATA:  Cough and chills. EXAM: CHEST  2 VIEW COMPARISON:  08/17/2016 FINDINGS: Normal heart size and mediastinal contours. No acute infiltrate or edema. No effusion or pneumothorax. No acute osseous findings. IMPRESSION: Negative chest. Electronically Signed   By: Marnee SpringJonathon  Watts M.D.   On: 05/06/2017 10:00    Procedures Procedures (including critical care time)  Medications Ordered in ED Medications  dexamethasone (DECADRON) injection 10 mg (10 mg Intramuscular Given 05/06/17 1004)     Initial Impression / Assessment and Plan / ED Course  I have reviewed the triage vital signs and the nursing notes.  Pertinent labs & imaging results that were available during my care of the patient were reviewed by me and considered in my medical decision making (see chart for details).    Pt CXR  negative for acute infiltrate. Patients symptoms are consistent with URI, likely viral etiology. Discussed that antibiotics are not indicated for viral infections. Pt will be discharged with symptomatic treatment.  While here she will be given a shot of Decadron for tonsillar swelling.  She will also be given a prescription for Tessalon  for her cough.  Her right TM is perforated.  She reports that this is new for her.  She denies any decreased hearing out of this ear however.  She was given instructions on not submerging her head in water, and use wax earplugs when there is a chance that water may enter her ears.  As she has pain in this ear and this is a new finding for her she will be given antibiotic ear drops.  Regarding her headache she has chronic headaches and says that this is just a minor one.  I suggested OTC treatment and gave her a RX for naproxen.  She has not seen a neurologist for her many headaches so I will provide her a referral.  She verbalizes understanding and is agreeable with plan. Pt is hemodynamically stable & in NAD prior to dc.    Final Clinical Impressions(s) / ED Diagnoses   Final diagnoses:  Upper respiratory tract infection, unspecified type  Tympanic membrane rupture, right  Chronic nonintractable headache, unspecified headache type  Sore throat    New Prescriptions Discharge Medication List as of 05/06/2017 10:18 AM    START taking these medications   Details  benzonatate (TESSALON) 100 MG capsule Take 1 capsule (100 mg total) by mouth every 8 (eight) hours as needed for cough., Starting Wed 05/06/2017, Print    naproxen (NAPROSYN) 500 MG tablet Take 1 tablet (500 mg total) by mouth 2 (two) times daily as needed for headache., Starting Wed 05/06/2017, Until Sat 05/16/2017, Print         Cristina GongHammond, Cinthya Bors W, PA-C 05/07/17 1627    Cathren LaineSteinl, Kevin, MD 05/08/17 1404

## 2017-05-19 ENCOUNTER — Emergency Department (HOSPITAL_COMMUNITY)
Admission: EM | Admit: 2017-05-19 | Discharge: 2017-05-19 | Disposition: A | Discharge status: Home / Self Care | Payer: Self-pay | Attending: Emergency Medicine | Admitting: Emergency Medicine

## 2017-05-19 ENCOUNTER — Encounter (HOSPITAL_COMMUNITY): Payer: Self-pay | Admitting: Emergency Medicine

## 2017-05-19 DIAGNOSIS — D573 Sickle-cell trait: Secondary | ICD-10-CM | POA: Insufficient documentation

## 2017-05-19 DIAGNOSIS — J069 Acute upper respiratory infection, unspecified: Secondary | ICD-10-CM

## 2017-05-19 DIAGNOSIS — Z79899 Other long term (current) drug therapy: Secondary | ICD-10-CM | POA: Insufficient documentation

## 2017-05-19 DIAGNOSIS — J399 Disease of upper respiratory tract, unspecified: Secondary | ICD-10-CM | POA: Insufficient documentation

## 2017-05-19 MED ORDER — AMOXICILLIN 500 MG PO CAPS: 500.0000 mg | ORAL_CAPSULE | Freq: Two times a day (BID) | ORAL | 0 refills | Status: DC

## 2017-05-19 NOTE — ED Notes (Signed)
PT states understanding of care given, follow up care, and medication prescribed. PT ambulated from ED to car with a steady gait. 

## 2017-05-19 NOTE — Discharge Instructions (Signed)
Please read attached information. If you experience any new or worsening signs or symptoms please return to the emergency room for evaluation. Please follow-up with your primary care provider or specialist as discussed. Please use medication prescribed only as directed and discontinue taking if you have any concerning signs or symptoms.   °

## 2017-05-19 NOTE — ED Provider Notes (Signed)
[facility] Provider [facility]   CSN: [id number] Arrival date & time: 05/19/17  1813     History   Chief Complaint Chief Complaint  Patient presents with  . Otalgia  . Nasal Congestion  . Headache    HPI Anna Walls is a 36 y.o. female.  HPI   36 year old female presents today with complaints of ear pain and upper respiratory congestion.  Patient reports she was seen approximate 10 days ago with pain in her right ear, nasal congestion.  She was noted to have a perforated TM on the right, she was started on topical antibiotics.  She notes the right ear pain is improved, now having pain on the left, she continues to endorse nasal congestion and rhinorrhea.  She reports a frontal headache due to the sinus pressure.  Patient denies any fever.  She reports she has a history of allergies, but is not taking her Zyrtec.  She notes some dry throat, denies any severe pain.    Past Medical History:  Diagnosis Date  . Chronic headaches   . Hyperemesis   . Low back pain   . Pericardial effusion    a. Trace by echo 12/2015.  Marland Kitchen. Pericarditis    a. Post-partum 12/2015.  Marland Kitchen. Preeclampsia in postpartum period   . Sickle cell trait [facility](HCC)     Patient Active Problem List   Diagnosis Date Noted  . Chronic nonintractable headache 05/06/2017  . Tympanic membrane rupture, right 05/06/2017  . Mastitis, right, acute 01/08/2016  . Postoperative pulmonary edema (HCC) 01/07/2016  . Chest pain at rest   . Shortness of breath   . Acute pericarditis   . Preeclampsia in postpartum period 01/06/2016  . Supervision of high risk pregnancy, antepartum 06/25/2015    History reviewed. No pertinent surgical history.  OB History    Gravida Para Term Preterm AB Living   5 5 5     4    SAB TAB Ectopic Multiple Live Births         0 4       Home Medications    Prior to Admission medications   Medication Sig Start Date End Date Taking? Authorizing Provider    Aspirin-Salicylamide-Caffeine (BC HEADACHE PO) Take 1 packet by mouth daily as needed (pain).   Yes [provider]  butalbital-acetaminophen-caffeine (FIORICET, ESGIC) [phone] MG tablet Take 1-2 tablets by mouth every 6 (six) hours as needed for headache. 04/04/17 04/04/18 Yes Law, Waylan BogaAlexandra M, PA-C  cetirizine (ZYRTEC) 10 MG tablet Take 10 mg daily by mouth.   Yes [provider]  amoxicillin (AMOXIL) 500 MG capsule Take 1 capsule (500 mg total) 2 (two) times daily by mouth. 05/19/17   Ikea Demicco, Tinnie GensJeffrey, PA-C  benzonatate (TESSALON) 100 MG capsule Take 1 capsule (100 mg total) by mouth every 8 (eight) hours as needed for cough. Patient not taking: Reported on 05/19/2017 05/06/17   Cristina GongHammond, Elizabeth W, PA-C  colchicine 0.6 MG tablet Take 1 tablet (0.6 mg total) by mouth daily. Patient not taking: Reported on 04/03/2017 01/11/16   Levie HeritageStinson, Jacob J, DO  dicloxacillin (DYNAPEN) 500 MG capsule Take 1 capsule (500 mg total) by mouth every 6 (six) hours. Patient not taking: Reported on 04/03/2017 01/11/16   Levie HeritageStinson, Jacob J, DO  ibuprofen (ADVIL,MOTRIN) 600 MG tablet Take 1 tablet (600 mg total) by mouth every 6 (six) hours. Patient not taking: Reported on 05/19/2017 04/04/17   Emi HolesLaw, Alexandra M, PA-C  NIFEdipine (PROCARDIA-XL/ADALAT CC) 30 MG 24  hr tablet Take 1 tablet (30 mg total) by mouth 2 (two) times daily. Patient not taking: Reported on 04/03/2017 01/11/16   Levie HeritageStinson, Jacob J, DO  Prenatal Vit-Fe Fumarate-FA (PRENATAL COMPLETE) 14-0.4 MG TABS Take 1 tablet by mouth daily. Patient not taking: Reported on 04/03/2017 09/26/15   Leftwich-Kirby, Wilmer FloorLisa A, CNM  pseudoephedrine (SUDAFED) 30 MG tablet Take 1 tablet (30 mg total) by mouth daily. Patient not taking: Reported on 04/03/2017 01/11/16   Levie HeritageStinson, Jacob J, DO  ranitidine (ZANTAC) 150 MG capsule Take 1 capsule (150 mg total) by mouth 2 (two) times daily. Patient not taking: Reported on 04/03/2017 12/19/15   Poe, Deirdre C, CNM   sulfamethoxazole-trimethoprim (BACTRIM DS,SEPTRA DS) 800-160 MG tablet Take 1 tablet by mouth every 12 (twelve) hours. Patient not taking: Reported on 04/03/2017 01/11/16   Levie HeritageStinson, Jacob J, DO    Family History Family History  Family history unknown: Yes    Social History Social History   Tobacco Use  . Smoking status: Never Smoker  . Smokeless tobacco: Never Used  Substance Use Topics  . Alcohol use: No  . Drug use: No     Allergies   Patient has no known allergies.   Review of Systems Review of Systems  All other systems reviewed and are negative.    Physical Exam Updated Vital Signs BP 119/79 (BP Location: Left Arm)   Pulse 95   Temp 98.1 F (36.7 C) (Oral)   Resp 17   LMP 03/22/2017   SpO2 99%   Physical Exam  Constitutional: She is oriented to person, place, and time. She appears well-developed and well-nourished.  HENT:  Head: Normocephalic and atraumatic.  Right Ear: Hearing and ear canal normal. No swelling or tenderness. No mastoid tenderness. Tympanic membrane is scarred. Tympanic membrane is not injected and not perforated. No middle ear effusion.  Left Ear: Hearing, tympanic membrane and ear canal normal. No swelling or tenderness. No mastoid tenderness. Tympanic membrane is not injected, not scarred and not perforated.  No middle ear effusion.  Mouth/Throat: Oropharynx is clear and moist and mucous membranes are normal. No oropharyngeal exudate, posterior oropharyngeal edema or posterior oropharyngeal erythema.  Eyes: Conjunctivae are normal. Pupils are equal, round, and reactive to light. Right eye exhibits no discharge. Left eye exhibits no discharge. No scleral icterus.  Neck: Normal range of motion. No JVD present. No tracheal deviation present.  Pulmonary/Chest: Effort normal. No stridor.  Neurological: She is alert and oriented to person, place, and time. Coordination normal.  Psychiatric: She has a normal mood and affect. Her behavior is  normal. Judgment and thought content normal.  Nursing note and vitals reviewed.    ED Treatments / Results  Labs (all labs ordered are listed, but only abnormal results are displayed) Labs Reviewed - No data to display  EKG  EKG Interpretation None       Radiology No results found.  Procedures Procedures (including critical care time)  Medications Ordered in ED Medications - No data to display   Initial Impression / Assessment and Plan / ED Course  I have reviewed the triage vital signs and the nursing notes.  Pertinent labs & imaging results that were available during my care of the patient were reviewed by me and considered in my medical decision making (see chart for details).    Final Clinical Impressions(s) / ED Diagnoses   Final diagnoses:  Upper respiratory tract infection, unspecified type    Labs:   Imaging:  Consults:  Therapeutics:  Discharge Meds:   Assessment/Plan: 36 year old female presents today with nasal congestion and ear pain.  I have a high suspicion that this is allergic in nature given patient's history of allergies and noncompliance with Zyrtec.  Patient did have a perforated TM at her previous visit now having pain on the left.  She has no significant signs of bacterial sinusitis, but given duration of symptoms and previous perforation I think it prudent to place patient on antibiotics at this time.  I highly encourage patient to use Zyrtec, nasal saline rinses prior to initiating antibiotic therapy, if symptoms do not improve with symptomatic care she is instructed to initiate antibiotics.  She will return immediately with any new or worsening signs or symptoms.  Patient verbalized understanding and agreement to today's plan had no further questions or concerns at time discharge.      ED Discharge Orders        Ordered    amoxicillin (AMOXIL) 500 MG capsule  2 times daily     05/19/17 2020       Eyvonne Mechanic, PA-C 05/19/17  2021    Gwyneth Sprout, MD 05/19/17 2316

## 2017-05-19 NOTE — ED Triage Notes (Signed)
Pt. Stated, I was here 2 weeks ago for the same problem, both my ears are hurt and itching, I also have a headache and some still congestion.

## 2017-10-13 ENCOUNTER — Encounter: Payer: Self-pay | Admitting: *Deleted

## 2017-12-15 ENCOUNTER — Other Ambulatory Visit: Payer: Self-pay

## 2017-12-15 ENCOUNTER — Emergency Department (HOSPITAL_COMMUNITY)
Admission: EM | Admit: 2017-12-15 | Discharge: 2017-12-15 | Disposition: A | Discharge status: Home / Self Care | Payer: Self-pay | Attending: Emergency Medicine | Admitting: Emergency Medicine

## 2017-12-15 ENCOUNTER — Emergency Department (HOSPITAL_COMMUNITY): Payer: Self-pay

## 2017-12-15 ENCOUNTER — Encounter (HOSPITAL_COMMUNITY): Payer: Self-pay

## 2017-12-15 DIAGNOSIS — Z79899 Other long term (current) drug therapy: Secondary | ICD-10-CM | POA: Insufficient documentation

## 2017-12-15 DIAGNOSIS — J069 Acute upper respiratory infection, unspecified: Secondary | ICD-10-CM | POA: Insufficient documentation

## 2017-12-15 MED ORDER — ALBUTEROL SULFATE HFA 108 (90 BASE) MCG/ACT IN AERS: 1.0000 | INHALATION_SPRAY | Freq: Four times a day (QID) | RESPIRATORY_TRACT | 0 refills | Status: DC | PRN

## 2017-12-15 MED ORDER — IPRATROPIUM-ALBUTEROL 0.5-2.5 (3) MG/3ML IN SOLN
3.0000 mL | Freq: Once | RESPIRATORY_TRACT | Status: AC
  Administered 2017-12-15: 3 mL via RESPIRATORY_TRACT
  Filled 2017-12-15: qty 3

## 2017-12-15 MED ORDER — BENZONATATE 100 MG PO CAPS: 100.0000 mg | ORAL_CAPSULE | Freq: Three times a day (TID) | ORAL | 0 refills | Status: DC

## 2017-12-15 MED ORDER — FLUTICASONE PROPIONATE 50 MCG/ACT NA SUSP: 2.0000 | Freq: Every day | NASAL | 0 refills | Status: DC

## 2017-12-15 NOTE — ED Triage Notes (Signed)
Pt is c/o cough x 2 weeks that is productive with yellow sputum. Pt reports that she has been taking OTC and benzoate but has not been effective. Pt aslo has nasal congestion denies fever.

## 2017-12-15 NOTE — Discharge Instructions (Addendum)
You were given a perception for Flonase, benzonatate for your cough, and an albuterol inhaler for your cough as well.  Make sure to stay well-hydrated over the next several days.  You may use a humidifier in your room to help with your cough.  Please follow-up with your primary care doctor in 1 week for reevaluation and return to the ER if you have any new or worsening symptoms.

## 2017-12-15 NOTE — ED Provider Notes (Signed)
[facility]-EMERGENCY DEPT Provider Note   CSN: [id number] Arrival date & time: 12/15/17  1819     History   Chief Complaint Chief Complaint  Patient presents with  . Cough    HPI Anna Walls is a 37 y.o. female.  HPI  Patient is a 37 year old female who presents the emergency department today to be evaluated for productive cough with clear/yellow sputum, rhinorrhea, nasal congestion, intermittent sore throat for the last 2 weeks.  States she initially had some cold chills and headaches when her symptoms started but that has resolved.  She denies any fevers.  No chest pain.  Intermittently has difficulty breathing when she has a coughing spell however no persistent difficulty breathing.  No swelling in her legs.  No abdominal pain nausea or vomiting.  No bilateral ear pain.  Has tried over-the-counter cold medications with no significant relief.  Immunizations up-to-date.  No recent foreign travel.   Past Medical History:  Diagnosis Date  . Chronic headaches   . Hyperemesis   . Low back pain   . Pericardial effusion    a. Trace by echo 12/2015.  Marland Kitchen. Pericarditis    a. Post-partum 12/2015.  Marland Kitchen. Preeclampsia in postpartum period   . Sickle cell trait [facility](HCC)     Patient Active Problem List   Diagnosis Date Noted  . Chronic nonintractable headache 05/06/2017  . Tympanic membrane rupture, right 05/06/2017  . Mastitis, right, acute 01/08/2016  . Postoperative pulmonary edema (HCC) 01/07/2016  . Chest pain at rest   . Shortness of breath   . Acute pericarditis   . Preeclampsia in postpartum period 01/06/2016  . Supervision of high risk pregnancy, antepartum 06/25/2015    History reviewed. No pertinent surgical history.   OB History    Gravida  5   Para  5   Term  5   Preterm      AB      Living  4     SAB      TAB      Ectopic      Multiple  0   Live Births  4            Home Medications    Prior to Admission medications     Medication Sig Start Date End Date Taking? Authorizing Provider  albuterol (PROVENTIL HFA;VENTOLIN HFA) 108 (90 Base) MCG/ACT inhaler Inhale 1-2 puffs into the lungs every 6 (six) hours as needed for wheezing or shortness of breath. 12/15/17   Eadie Repetto S, PA-C  amoxicillin (AMOXIL) 500 MG capsule Take 1 capsule (500 mg total) 2 (two) times daily by mouth. 05/19/17   Hedges, Tinnie GensJeffrey, PA-C  Aspirin-Salicylamide-Caffeine (BC HEADACHE PO) Take 1 packet by mouth daily as needed (pain).    [provider]  benzonatate (TESSALON) 100 MG capsule Take 1 capsule (100 mg total) by mouth every 8 (eight) hours. 12/15/17   Darlina Mccaughey S, PA-C  butalbital-acetaminophen-caffeine (FIORICET, ESGIC) 567075200550-325-40 MG tablet Take 1-2 tablets by mouth every 6 (six) hours as needed for headache. 04/04/17 04/04/18  Law, Waylan BogaAlexandra M, PA-C  cetirizine (ZYRTEC) 10 MG tablet Take 10 mg daily by mouth.    [provider]  colchicine 0.6 MG tablet Take 1 tablet (0.6 mg total) by mouth daily. Patient not taking: Reported on 04/03/2017 01/11/16   Levie HeritageStinson, Jacob J, DO  dicloxacillin (DYNAPEN) 500 MG capsule Take 1 capsule (500 mg total) by mouth every 6 (six) hours. Patient not taking: Reported on 04/03/2017  01/11/16   Levie Heritage, DO  fluticasone (FLONASE) 50 MCG/ACT nasal spray Place 2 sprays into both nostrils daily. 12/15/17   Leanne Sisler S, PA-C  ibuprofen (ADVIL,MOTRIN) 600 MG tablet Take 1 tablet (600 mg total) by mouth every 6 (six) hours. Patient not taking: Reported on 05/19/2017 04/04/17   Emi Holes, PA-C  NIFEdipine (PROCARDIA-XL/ADALAT CC) 30 MG 24 hr tablet Take 1 tablet (30 mg total) by mouth 2 (two) times daily. Patient not taking: Reported on 04/03/2017 01/11/16   Levie Heritage, DO  Prenatal Vit-Fe Fumarate-FA (PRENATAL COMPLETE) 14-0.4 MG TABS Take 1 tablet by mouth daily. Patient not taking: Reported on 04/03/2017 09/26/15   Leftwich-Kirby, Wilmer Floor, CNM  pseudoephedrine (SUDAFED) 30 MG  tablet Take 1 tablet (30 mg total) by mouth daily. Patient not taking: Reported on 04/03/2017 01/11/16   Levie Heritage, DO  ranitidine (ZANTAC) 150 MG capsule Take 1 capsule (150 mg total) by mouth 2 (two) times daily. Patient not taking: Reported on 04/03/2017 12/19/15   Poe, Deirdre C, CNM  sulfamethoxazole-trimethoprim (BACTRIM DS,SEPTRA DS) 800-160 MG tablet Take 1 tablet by mouth every 12 (twelve) hours. Patient not taking: Reported on 04/03/2017 01/11/16   Levie Heritage, DO    Family History Family History  Family history unknown: Yes    Social History Social History   Tobacco Use  . Smoking status: Never Smoker  . Smokeless tobacco: Never Used  Substance Use Topics  . Alcohol use: No  . Drug use: No     Allergies   Patient has no known allergies.   Review of Systems Review of Systems  Constitutional: Negative for chills and fever.  HENT: Positive for congestion, rhinorrhea and sneezing. Negative for ear pain and sore throat.   Eyes: Negative for pain and visual disturbance.  Respiratory: Positive for cough and shortness of breath. Negative for wheezing.   Cardiovascular: Negative for chest pain and leg swelling.  Gastrointestinal: Negative for abdominal pain, diarrhea, nausea and vomiting.  Genitourinary: Negative for dysuria and hematuria.  Musculoskeletal: Negative for back pain.  Skin: Negative for color change and rash.  Neurological: Negative for dizziness, weakness, light-headedness, numbness and headaches.  All other systems reviewed and are negative.   Physical Exam Updated Vital Signs BP 137/88   Pulse 85   Temp 98.5 F (36.9 C) (Oral)   Resp 18   Ht 5\' 4"  (1.626 m)   Wt 90.7 kg (200 lb)   LMP 12/08/2017   SpO2 100%   BMI 34.33 kg/m   Physical Exam  Constitutional: She is oriented to person, place, and time. She appears well-developed and well-nourished. No distress.  HENT:  Head: Normocephalic and atraumatic.  Bilateral TMs without  erythema or effusion.  No pharyngeal erythema.  No tonsillar swelling or exudates.  Tolerating secretions.  No evidence of PTA or retropharyngeal abscess.  Bilateral nasal turbinates are swollen.  Sounds congested.  Eyes: Pupils are equal, round, and reactive to light. Conjunctivae and EOM are normal.  Neck: Neck supple.  Cardiovascular: Normal rate, regular rhythm, normal heart sounds and intact distal pulses.  No murmur heard. Pulmonary/Chest: Effort normal and breath sounds normal. No stridor. No respiratory distress. She has no wheezes.  Good air exchange throughout, no tachypnea.  Abdominal: Soft. There is no tenderness.  Musculoskeletal: She exhibits no edema.  Lymphadenopathy:    She has no cervical adenopathy.  Neurological: She is alert and oriented to person, place, and time. No cranial nerve deficit.  Skin:  Skin is warm and dry.  Psychiatric: She has a normal mood and affect.  Nursing note and vitals reviewed.  ED Treatments / Results  Labs (all labs ordered are listed, but only abnormal results are displayed) Labs Reviewed - No data to display  EKG None  Radiology Dg Chest 2 View  Result Date: 12/15/2017 CLINICAL DATA:  Cough for 2 weeks. Patient reports productive cough with yellow sputum. EXAM: CHEST - 2 VIEW COMPARISON:  Radiographs 10248 FINDINGS: Low lung volumes.The cardiomediastinal contours are normal. Pulmonary vasculature is normal. No consolidation, pleural effusion, or pneumothorax. No acute osseous abnormalities are seen. IMPRESSION: Low lung volumes without acute chest findings. Electronically Signed   By: Rubye Oaks M.D.   On: 12/15/2017 20:09    Procedures Procedures (including critical care time)  Medications Ordered in ED Medications  ipratropium-albuterol (DUONEB) 0.5-2.5 (3) MG/3ML nebulizer solution 3 mL (3 mLs Nebulization Given 12/15/17 2105)    Initial Impression / Assessment and Plan / ED Course  I have reviewed the triage vital signs and  the nursing notes.  Pertinent labs & imaging results that were available during my care of the patient were reviewed by me and considered in my medical decision making (see chart for details).     Final Clinical Impressions(s) / ED Diagnoses   Final diagnoses:  Upper respiratory tract infection, unspecified type   Pt CXR negative for acute infiltrate.  Patient feels unchanged after nebulizer treatment.  Patients symptoms are consistent with URI, likely viral etiology. Discussed that antibiotics are not indicated for viral infections. Pt will be discharged with symptomatic treatment.  Verbalizes understanding and is agreeable with plan. Pt is hemodynamically stable & in NAD prior to dc.   ED Discharge Orders        Ordered    fluticasone (FLONASE) 50 MCG/ACT nasal spray  Daily     12/15/17 2151    albuterol (PROVENTIL HFA;VENTOLIN HFA) 108 (90 Base) MCG/ACT inhaler  Every 6 hours PRN     12/15/17 2151    benzonatate (TESSALON) 100 MG capsule  Every 8 hours     12/15/17 2151       Karrie Meres, PA-C 12/15/17 2202    Bethann Berkshire, MD 12/18/17 1310

## 2018-08-29 ENCOUNTER — Emergency Department (HOSPITAL_COMMUNITY)
Admission: EM | Admit: 2018-08-29 | Discharge: 2018-08-30 | Disposition: A | Discharge status: Home / Self Care | Payer: Self-pay | Attending: Emergency Medicine | Admitting: Emergency Medicine

## 2018-08-29 ENCOUNTER — Encounter (HOSPITAL_COMMUNITY): Payer: Self-pay | Admitting: Emergency Medicine

## 2018-08-29 ENCOUNTER — Emergency Department (HOSPITAL_COMMUNITY): Payer: Self-pay

## 2018-08-29 DIAGNOSIS — Z79899 Other long term (current) drug therapy: Secondary | ICD-10-CM | POA: Insufficient documentation

## 2018-08-29 DIAGNOSIS — N644 Mastodynia: Secondary | ICD-10-CM | POA: Insufficient documentation

## 2018-08-29 DIAGNOSIS — R072 Precordial pain: Secondary | ICD-10-CM | POA: Insufficient documentation

## 2018-08-29 DIAGNOSIS — R0789 Other chest pain: Secondary | ICD-10-CM | POA: Insufficient documentation

## 2018-08-29 LAB — BASIC METABOLIC PANEL
Anion gap: 9 (ref 5–15)
BUN: 10 mg/dL (ref 6–20)
CO2: 24 mmol/L (ref 22–32)
Calcium: 9.6 mg/dL (ref 8.9–10.3)
Chloride: 105 mmol/L (ref 98–111)
Creatinine, Ser: 0.68 mg/dL (ref 0.44–1.00)
GFR calc Af Amer: >60 mL/min (ref >60)
GFR calc non Af Amer: >60 mL/min (ref >60)
Glucose, Bld: 97 mg/dL (ref 70–99)
Potassium: 3.4 mmol/L — ABNORMAL LOW (ref 3.5–5.1)
Sodium: 138 mmol/L (ref 135–145)

## 2018-08-29 LAB — CBC
HCT: 38.3 % (ref 36.0–46.0)
Hemoglobin: 13.1 g/dL (ref 12.0–15.0)
MCH: 26.3 pg (ref 26.0–34.0)
MCHC: 34.2 g/dL (ref 30.0–36.0)
MCV: 76.8 fL — ABNORMAL LOW (ref 80.0–100.0)
Platelets: 408 10*3/uL — ABNORMAL HIGH (ref 150–400)
RBC: 4.99 MIL/uL (ref 3.87–5.11)
RDW: 13.2 % (ref 11.5–15.5)
WBC: 7.1 10*3/uL (ref 4.0–10.5)
nRBC: 0 % (ref 0.0–0.2)

## 2018-08-29 LAB — I-STAT TROPONIN, ED: Troponin i, poc: 0 ng/mL (ref 0.00–0.08)

## 2018-08-29 LAB — I-STAT BETA HCG BLOOD, ED (MC, WL, AP ONLY): I-stat hCG, quantitative: <5 m[IU]/mL (ref <5)

## 2018-08-29 MED ORDER — SODIUM CHLORIDE 0.9% FLUSH: 3.0000 mL | Freq: Once | INTRAVENOUS | Status: DC

## 2018-08-29 MED ORDER — ACETAMINOPHEN 325 MG PO TABS
650.0000 mg | ORAL_TABLET | Freq: Once | ORAL | Status: AC
  Administered 2018-08-29: 650 mg via ORAL
  Filled 2018-08-29: qty 2

## 2018-08-29 NOTE — ED Triage Notes (Signed)
Pt c/o upper chest pain radiating through to back x 2 weeks, increased shob, especially with exertion.  L breast pain onset yesterday, not relieved by apap at home. Hx of mastitis  C/o hemorrhoidal itching Denies n/v/d. No edema noted.

## 2018-08-30 LAB — BRAIN NATRIURETIC PEPTIDE: B Natriuretic Peptide: 9.8 pg/mL (ref 0.0–100.0)

## 2018-08-30 LAB — CK: Total CK: 56 U/L (ref 38–234)

## 2018-08-30 LAB — I-STAT TROPONIN, ED: Troponin i, poc: 0.01 ng/mL (ref 0.00–0.08)

## 2018-08-30 MED ORDER — NAPROXEN 250 MG PO TABS
500.0000 mg | ORAL_TABLET | Freq: Once | ORAL | Status: AC
  Administered 2018-08-30: 500 mg via ORAL
  Filled 2018-08-30: qty 2

## 2018-08-30 MED ORDER — ACETAMINOPHEN 325 MG PO TABS: 650.0000 mg | ORAL_TABLET | Freq: Four times a day (QID) | ORAL | 0 refills | Status: DC | PRN

## 2018-08-30 MED ORDER — NAPROXEN 500 MG PO TABS: 500.0000 mg | ORAL_TABLET | Freq: Two times a day (BID) | ORAL | 0 refills | Status: DC

## 2018-08-30 NOTE — Discharge Instructions (Addendum)
We saw you in the ER for breast and chest discomfort. All the results in the ER are normal, labs and imaging. We are not sure what is causing your symptoms but it appears that your chest pain is likely musculoskeletal in nature.  The breast pain does not appear to be because of infection.  The workup in the ER is not complete, and is limited to screening for life threatening and emergent conditions only, so please see a primary care doctor for further evaluation.  Take the medications prescribed for optimal pain control.

## 2018-08-30 NOTE — ED Notes (Signed)
Patient verbalizes understanding of discharge instructions. Opportunity for questioning and answers were provided. Armband removed by staff, pt discharged from ED ambulatory.   

## 2018-08-30 NOTE — ED Provider Notes (Signed)
[facility] EMERGENCY DEPARTMENT Provider Note   CSN: [id number] Arrival date & time: 08/29/18  2150     History   Chief Complaint Chief Complaint  Patient presents with  . Chest Pain    HPI Anna Walls is a 38 y.o. female.  HPI  38 year old female comes in with chief complaint of chest discomfort.  Patient has history of pericarditis, preeclampsia and mastitis. Patient states that she has been having chest pain for the last 2 weeks.  Her chest pain is epigastric, and it is worse with movement.  The pain also radiates to her back.  She is also complaining of breast pain that started earlier today.  The pain is similar to her mastitis type pain, however she denies any increased redness or discharge from the nipple.  Review of system is negative for any nausea, vomiting, fevers, chills.  Past Medical History:  Diagnosis Date  . Chronic headaches   . Hyperemesis   . Low back pain   . Pericardial effusion    a. Trace by echo 12/2015.  Marland Kitchen Pericarditis    a. Post-partum 12/2015.  Marland Kitchen Preeclampsia in postpartum period   . Sickle cell trait [facility])     Patient Active Problem List   Diagnosis Date Noted  . Chronic nonintractable headache 05/06/2017  . Tympanic membrane rupture, right 05/06/2017  . Mastitis, right, acute 01/08/2016  . Postoperative pulmonary edema (HCC) 01/07/2016  . Chest pain at rest   . Shortness of breath   . Acute pericarditis   . Preeclampsia in postpartum period 01/06/2016  . Supervision of high risk pregnancy, antepartum 06/25/2015    History reviewed. No pertinent surgical history.   OB History    Gravida  5   Para  5   Term  5   Preterm      AB      Living  4     SAB      TAB      Ectopic      Multiple  0   Live Births  4            Home Medications    Prior to Admission medications   Medication Sig Start Date End Date Taking? Authorizing Provider  albuterol (PROVENTIL HFA;VENTOLIN HFA) 108 (90  Base) MCG/ACT inhaler Inhale 1-2 puffs into the lungs every 6 (six) hours as needed for wheezing or shortness of breath. 12/15/17  Yes Couture, Cortni S, PA-C  Aspirin-Salicylamide-Caffeine (BC HEADACHE PO) Take 1 packet by mouth daily as needed (pain).   Yes [provider]  cetirizine (ZYRTEC) 10 MG tablet Take 10 mg by mouth daily as needed for allergies.    Yes [provider]  pseudoephedrine (SUDAFED) 30 MG tablet Take 1 tablet (30 mg total) by mouth daily. Patient taking differently: Take 30 mg by mouth every 8 (eight) hours as needed for congestion.  01/11/16  Yes Levie Heritage, DO  vitamin B-12 (CYANOCOBALAMIN) 1000 MCG tablet Take 1,000 mcg by mouth daily.   Yes [provider]  acetaminophen (TYLENOL) 325 MG tablet Take 2 tablets (650 mg total) by mouth every 6 (six) hours as needed. 08/30/18   Derwood Kaplan, MD  amoxicillin (AMOXIL) 500 MG capsule Take 1 capsule (500 mg total) 2 (two) times daily by mouth. Patient not taking: Reported on 08/30/2018 05/19/17   Hedges, Tinnie Gens, PA-C  benzonatate (TESSALON) 100 MG capsule Take 1 capsule (100 mg total) by mouth every 8 (eight) hours. Patient not  taking: Reported on 08/30/2018 12/15/17   Couture, Cortni S, PA-C  colchicine 0.6 MG tablet Take 1 tablet (0.6 mg total) by mouth daily. Patient not taking: Reported on 04/03/2017 01/11/16   Levie HeritageStinson, Jacob J, DO  dicloxacillin (DYNAPEN) 500 MG capsule Take 1 capsule (500 mg total) by mouth every 6 (six) hours. Patient not taking: Reported on 04/03/2017 01/11/16   Levie HeritageStinson, Jacob J, DO  fluticasone [facility](FLONASE) 50 MCG/ACT nasal spray Place 2 sprays into both nostrils daily. Patient not taking: Reported on 08/30/2018 12/15/17   Couture, Cortni S, PA-C  ibuprofen (ADVIL,MOTRIN) 600 MG tablet Take 1 tablet (600 mg total) by mouth every 6 (six) hours. Patient not taking: Reported on 05/19/2017 04/04/17   Emi HolesLaw, Alexandra M, PA-C  naproxen (NAPROSYN) 500 MG tablet Take 1 tablet (500 mg total) by  mouth 2 (two) times daily. 08/30/18   Derwood KaplanNanavati, Sherrill Mckamie, MD  NIFEdipine (PROCARDIA-XL/ADALAT CC) 30 MG 24 hr tablet Take 1 tablet (30 mg total) by mouth 2 (two) times daily. Patient not taking: Reported on 04/03/2017 01/11/16   Levie HeritageStinson, Jacob J, DO  Prenatal Vit-Fe Fumarate-FA (PRENATAL COMPLETE) 14-0.4 MG TABS Take 1 tablet by mouth daily. Patient not taking: Reported on 04/03/2017 09/26/15   Hurshel PartyLeftwich-Kirby, Lisa A, CNM  ranitidine (ZANTAC) 150 MG capsule Take 1 capsule (150 mg total) by mouth 2 (two) times daily. Patient not taking: Reported on 04/03/2017 12/19/15   Poe, Deirdre C, CNM  sulfamethoxazole-trimethoprim (BACTRIM DS,SEPTRA DS) 800-160 MG tablet Take 1 tablet by mouth every 12 (twelve) hours. Patient not taking: Reported on 04/03/2017 01/11/16   Levie HeritageStinson, Jacob J, DO    Family History Family History  Family history unknown: Yes    Social History Social History   Tobacco Use  . Smoking status: Never Smoker  . Smokeless tobacco: Never Used  Substance Use Topics  . Alcohol use: No  . Drug use: No     Allergies   Patient has no known allergies.   Review of Systems Review of Systems  Constitutional: Positive for activity change.  Respiratory: Positive for chest tightness. Negative for cough and shortness of breath.   Cardiovascular: Positive for chest pain.  Gastrointestinal: Negative for nausea and vomiting.  Allergic/Immunologic: Negative for immunocompromised state.  Hematological: Does not bruise/bleed easily.  All other systems reviewed and are negative.    Physical Exam Updated Vital Signs BP 115/81 (BP Location: Right Arm)   Pulse 78   Temp 97.8 F (36.6 C) (Oral)   Resp 19   Ht 5\' 4"  (1.626 m)   Wt 86.2 kg   LMP 08/25/2018   SpO2 100%   BMI 32.61 kg/m   Physical Exam Vitals signs and nursing note reviewed.  Constitutional:      Appearance: She is well-developed.  HENT:     Head: Normocephalic and atraumatic.  Eyes:     Pupils: Pupils are equal,  round, and reactive to light.  Neck:     Musculoskeletal: Neck supple.  Cardiovascular:     Rate and Rhythm: Normal rate and regular rhythm.     Heart sounds: Normal heart sounds. No murmur.  Pulmonary:     Effort: Pulmonary effort is normal. No respiratory distress.  Chest:     Comments: Breast exam reveals no axillary lymphadenopathy, breast erythema, signs of fluctuance.  Chest tenderness is worst with forward flexion, pec flexion and chest wall palpation.  Abdominal:     General: There is no distension.     Palpations: Abdomen is soft.  Tenderness: There is no abdominal tenderness. There is no guarding or rebound.  Skin:    General: Skin is warm and dry.  Neurological:     Mental Status: She is alert and oriented to person, place, and time.      ED Treatments / Results  Labs (all labs ordered are listed, but only abnormal results are displayed) Labs Reviewed  BASIC METABOLIC PANEL - Abnormal; Notable for the following components:      Result Value   Potassium 3.4 (*)    All other components within normal limits  CBC - Abnormal; Notable for the following components:   MCV 76.8 (*)    Platelets 408 (*)    All other components within normal limits  BRAIN NATRIURETIC PEPTIDE  CK  I-STAT TROPONIN, ED  I-STAT BETA HCG BLOOD, ED ([facility], [facility], [facility] ONLY)  I-STAT TROPONIN, ED    EKG EKG Interpretation  Date/Time:  Sunday August 29 2018 21:59:36 EST Ventricular Rate:  106 PR Interval:  148 QRS Duration: 78 QT Interval:  322 QTC Calculation: 427 R Axis:   55 Text Interpretation:  Sinus tachycardia Cannot rule out Anterior infarct , age undetermined Abnormal ECG No acute changes No significant change since last tracing Confirmed by Derwood Kaplananavati, Benzion Mesta [phone]([id number]) on 08/30/2018 12:21:26 AM   Radiology Dg Chest 2 View  Result Date: 08/29/2018 CLINICAL DATA:  38 year old female with left chest and breast pain for 2 weeks. EXAM: CHEST - 2 VIEW COMPARISON:  Chest radiograph  12/15/2017 and earlier. FINDINGS: Stable mild elevation of the right hemidiaphragm compatible with normal variant. Stable cardiac size at the upper limits of normal. Other mediastinal contours are within normal limits. Visualized tracheal air column is within normal limits. No pneumothorax, pulmonary edema, pleural effusion or confluent pulmonary opacity. No osseous abnormality identified. Negative visible bowel gas pattern. IMPRESSION: No acute cardiopulmonary abnormality. Cardiac size chronically at the upper limits of normal. Electronically Signed   By: Odessa FlemingH  Hall M.D.   On: 08/29/2018 23:33    Procedures Procedures (including critical care time)  Medications Ordered in ED Medications  sodium chloride flush (NS) 0.9 % injection 3 mL (has no administration in time range)  acetaminophen (TYLENOL) tablet 650 mg (650 mg Oral Given 08/29/18 2324)  naproxen (NAPROSYN) tablet 500 mg (500 mg Oral Given 08/30/18 0131)     Initial Impression / Assessment and Plan / ED Course  I have reviewed the triage vital signs and the nursing notes.  Pertinent labs & imaging results that were available during my care of the patient were reviewed by me and considered in my medical decision making (see chart for details).     Differential diagnosis includes: ACS syndrome Aortic dissection Valvular disorder Endocarditis Pericardial effusion / tamponade Pneumonia Pleural effusion / Pulmonary edema PE Pneumothorax Musculoskeletal pain PUD / Gastritis / Esophagitis Esophageal spasm  Patient comes in a chief complaint of chest discomfort. She is having midsternal chest pain that is radiating to her back side that has been going on for 2 weeks.  The pain is constant.  EKG is not showing any acute findings and troponins x2 are negative.  Her pain is reproducible with movement of her upper extremities and engagement of the pec muscle.  The pain is also worse with palpation of her chest area.  It seems like the pain  is likely musculoskeletal.  Additionally we also considered esophagitis or esophageal spasms in the differential given that she does have history of GERD -but it seems unlikely that  someone can have pain constantly for 2 weeks because of esophagitis or esophageal spasms that is not changing with food intake.  Patient is also having breast pain which started today.  No signs of infection on our evaluation.  Patient has been requested to follow-up with women's outpatient clinic for outpatient f/u.  Chaperone was utilized for the breast exam.  Final Clinical Impressions(s) / ED Diagnoses   Final diagnoses:  Breast pain in female  Precordial chest pain  Chest wall pain    ED Discharge Orders         Ordered    naproxen (NAPROSYN) 500 MG tablet  2 times daily     08/30/18 0251    acetaminophen (TYLENOL) 325 MG tablet  Every 6 hours PRN     08/30/18 0251           Derwood Kaplan, MD 08/30/18 [phone]

## 2018-08-30 NOTE — ED Notes (Signed)
Attempted to discharge pt, pt has multiple questions for provider. Dr. Rhunette Croft aware, at bedside.

## 2018-10-24 ENCOUNTER — Encounter (HOSPITAL_COMMUNITY): Payer: Self-pay

## 2018-10-24 ENCOUNTER — Other Ambulatory Visit: Payer: Self-pay

## 2018-10-24 ENCOUNTER — Emergency Department (HOSPITAL_COMMUNITY): Payer: Self-pay

## 2018-10-24 ENCOUNTER — Emergency Department (HOSPITAL_COMMUNITY)
Admission: EM | Admit: 2018-10-24 | Discharge: 2018-10-25 | Disposition: A | Discharge status: Home / Self Care | Payer: Self-pay | Attending: Emergency Medicine | Admitting: Emergency Medicine

## 2018-10-24 DIAGNOSIS — M5432 Sciatica, left side: Secondary | ICD-10-CM

## 2018-10-24 DIAGNOSIS — Z79899 Other long term (current) drug therapy: Secondary | ICD-10-CM | POA: Insufficient documentation

## 2018-10-24 DIAGNOSIS — M5416 Radiculopathy, lumbar region: Secondary | ICD-10-CM | POA: Insufficient documentation

## 2018-10-24 LAB — POC URINE PREG, ED: Preg Test, Ur: NEGATIVE

## 2018-10-24 LAB — HCG, QUANTITATIVE, PREGNANCY: hCG, Beta Chain, Quant, S: <1 m[IU]/mL (ref <5)

## 2018-10-24 MED ORDER — OXYCODONE-ACETAMINOPHEN 5-325 MG PO TABS
1.0000 | ORAL_TABLET | Freq: Once | ORAL | Status: AC
  Administered 2018-10-24: 1 via ORAL
  Filled 2018-10-24: qty 1

## 2018-10-24 NOTE — ED Triage Notes (Signed)
Onset 2 weeks left buttock, hip pain radiating down anterior left leg to foot.  Anterior thigh feels numb.   Pt talked to PCP who prescribed Prednisone, Motrin, flexeril.  Pt has taken meds with no relief.  No known injuries.  Pt walks up and down stairs at work.  Pt does not take BP medications anymore as PCP took her off meds.

## 2018-10-24 NOTE — ED Provider Notes (Signed)
[facility]   CSN: [id number] Arrival date & time: 10/24/18  2037    History   Chief Complaint Chief Complaint  Patient presents with  . Hip Pain    HPI Anna MangoMaimouna Reaser is a 38 y.o. female.     Patient to ED with progressive left lower back and hip pain x 4 weeks. That pain radiates down anterior and lateral thigh. No weakness, numbness, bladder/bowel dysfunction, saddle anesthesia. No known injury. She reports her pain started as a mild pain in her lumbar back prior to moving into the hip area. Her doctor prescribed taper prednisone, ibuprofen and Flexeril which she has taken for the past 2 weeks without any improvement. She feels her symptoms are worse. No abdominal pain, fever, nausea, wound, redness or LE swelling.  The history is provided by the patient. No language interpreter was used.  Hip Pain     Past Medical History:  Diagnosis Date  . Chronic headaches   . Hyperemesis   . Low back pain   . Pericardial effusion    a. Trace by echo 12/2015.  Marland Kitchen. Pericarditis    a. Post-partum 12/2015.  Marland Kitchen. Preeclampsia in postpartum period   . Sickle cell trait [facility](HCC)     Patient Active Problem List   Diagnosis Date Noted  . Chronic nonintractable headache 05/06/2017  . Tympanic membrane rupture, right 05/06/2017  . Mastitis, right, acute 01/08/2016  . Postoperative pulmonary edema (HCC) 01/07/2016  . Chest pain at rest   . Shortness of breath   . Acute pericarditis   . Preeclampsia in postpartum period 01/06/2016  . Supervision of high risk pregnancy, antepartum 06/25/2015    History reviewed. No pertinent surgical history.   OB History    Gravida  5   Para  5   Term  5   Preterm      AB      Living  4     SAB      TAB      Ectopic      Multiple  0   Live Births  4            Home Medications    Prior to Admission medications   Medication Sig Start Date End Date Taking? Authorizing  Provider  acetaminophen (TYLENOL) 325 MG tablet Take 2 tablets (650 mg total) by mouth every 6 (six) hours as needed. 08/30/18   Derwood KaplanNanavati, Ankit, MD  albuterol (PROVENTIL HFA;VENTOLIN HFA) 108 (90 Base) MCG/ACT inhaler Inhale 1-2 puffs into the lungs every 6 (six) hours as needed for wheezing or shortness of breath. 12/15/17   Couture, Cortni S, PA-C  amoxicillin (AMOXIL) 500 MG capsule Take 1 capsule (500 mg total) 2 (two) times daily by mouth. Patient not taking: Reported on 08/30/2018 05/19/17   Hedges, Tinnie GensJeffrey, PA-C  Aspirin-Salicylamide-Caffeine (BC HEADACHE PO) Take 1 packet by mouth daily as needed (pain).    [provider]  benzonatate (TESSALON) 100 MG capsule Take 1 capsule (100 mg total) by mouth every 8 (eight) hours. Patient not taking: Reported on 08/30/2018 12/15/17   Couture, Cortni S, PA-C  cetirizine (ZYRTEC) 10 MG tablet Take 10 mg by mouth daily as needed for allergies.     [provider]  colchicine 0.6 MG tablet Take 1 tablet (0.6 mg total) by mouth daily. Patient not taking: Reported on 04/03/2017 01/11/16   Levie HeritageStinson, Jacob J, DO  dicloxacillin (DYNAPEN) 500 MG capsule Take 1 capsule (500 mg  total) by mouth every 6 (six) hours. Patient not taking: Reported on 04/03/2017 01/11/16   Levie Heritage, DO  fluticasone [facility]) 50 MCG/ACT nasal spray Place 2 sprays into both nostrils daily. Patient not taking: Reported on 08/30/2018 12/15/17   Couture, Cortni S, PA-C  ibuprofen (ADVIL,MOTRIN) 600 MG tablet Take 1 tablet (600 mg total) by mouth every 6 (six) hours. Patient not taking: Reported on 05/19/2017 04/04/17   Emi Holes, PA-C  naproxen (NAPROSYN) 500 MG tablet Take 1 tablet (500 mg total) by mouth 2 (two) times daily. 08/30/18   Derwood Kaplan, MD  NIFEdipine (PROCARDIA-XL/ADALAT CC) 30 MG 24 hr tablet Take 1 tablet (30 mg total) by mouth 2 (two) times daily. Patient not taking: Reported on 04/03/2017 01/11/16   Levie Heritage, DO  Prenatal Vit-Fe Fumarate-FA  (PRENATAL COMPLETE) 14-0.4 MG TABS Take 1 tablet by mouth daily. Patient not taking: Reported on 04/03/2017 09/26/15   Leftwich-Kirby, Wilmer Floor, CNM  pseudoephedrine (SUDAFED) 30 MG tablet Take 1 tablet (30 mg total) by mouth daily. Patient taking differently: Take 30 mg by mouth every 8 (eight) hours as needed for congestion.  01/11/16   Levie Heritage, DO  ranitidine (ZANTAC) 150 MG capsule Take 1 capsule (150 mg total) by mouth 2 (two) times daily. Patient not taking: Reported on 04/03/2017 12/19/15   Poe, Deirdre C, CNM  sulfamethoxazole-trimethoprim (BACTRIM DS,SEPTRA DS) 800-160 MG tablet Take 1 tablet by mouth every 12 (twelve) hours. Patient not taking: Reported on 04/03/2017 01/11/16   Levie Heritage, DO  vitamin B-12 (CYANOCOBALAMIN) 1000 MCG tablet Take 1,000 mcg by mouth daily.    [provider]    Family History Family History  Family history unknown: Yes    Social History Social History   Tobacco Use  . Smoking status: Never Smoker  . Smokeless tobacco: Never Used  Substance Use Topics  . Alcohol use: No  . Drug use: No     Allergies   Patient has no known allergies.   Review of Systems Review of Systems  Constitutional: Negative for chills and fever.  Respiratory: Negative.   Cardiovascular: Negative.   Gastrointestinal: Negative.   Genitourinary: Negative for difficulty urinating and enuresis.  Musculoskeletal:       See HPI.  Skin: Negative.   Neurological: Negative.  Negative for weakness and numbness.     Physical Exam Updated Vital Signs BP 121/86 (BP Location: Right Arm)   Pulse (!) 107   Temp (!) 97.5 F (36.4 C) (Oral)   Resp 18   LMP 10/17/2018   SpO2 97%   Physical Exam Constitutional:      Appearance: She is well-developed.  Neck:     Musculoskeletal: Normal range of motion.  Pulmonary:     Effort: Pulmonary effort is normal.  Musculoskeletal:       Legs:     Comments: No lumbar or paralumbar tenderness. She is tender  over the areas as marked. No swelling, redness. She has full ROM bilateral LE's with equal strength without deficits.   Skin:    General: Skin is warm and dry.     Findings: No erythema.  Neurological:     Mental Status: She is alert and oriented to person, place, and time.     Sensory: No sensory deficit.     Deep Tendon Reflexes: Reflexes normal.      ED Treatments / Results  Labs (all labs ordered are listed, but only abnormal results are displayed) Labs Reviewed  HCG, QUANTITATIVE, PREGNANCY    EKG None  Radiology No results found.  Procedures Procedures (including critical care time)  Medications Ordered in ED Medications  oxyCODONE-acetaminophen (PERCOCET/ROXICET) 5-325 MG per tablet 1 tablet (has no administration in time range)     Initial Impression / Assessment and Plan / ED Course  I have reviewed the triage vital signs and the nursing notes.  Pertinent labs & imaging results that were available during my care of the patient were reviewed by me and considered in my medical decision making (see chart for details).        Patient to ED with 4 weeks of progressive left lower back to hip pain. Has taken prednisone, Flexeril, ibuprofen without relief. No neurologic deficits. No abdominal pain, fever.   Hip x-ray performed to assure the patient no arthritic changes to suggest a reason for her pain. Pain follows a radicular pattern, again, no neurologic deficits to cause concern for acute condition, cauda equina. Do not feel emergent MRI is indicated although discussed with the patient that she may need one if pain persists through her primary care provider.   She feels some relief with Percocet but reports persistent pain and drowsiness with the medication. Will provide pain relief. Suggested continuation of ibuprofen, rest.   Final Clinical Impressions(s) / ED Diagnoses   Final diagnoses:  None   1. Lumbar radiculopathy  ED Discharge Orders    None        Elpidio Anis, Cordelia Poche 10/24/18 2359    Geoffery Lyons, MD 10/25/18 2134

## 2018-10-25 MED ORDER — NAPROXEN 375 MG PO TABS: 375.0000 mg | ORAL_TABLET | Freq: Three times a day (TID) | ORAL | 0 refills | Status: DC

## 2018-10-25 MED ORDER — HYDROCODONE-ACETAMINOPHEN 5-325 MG PO TABS: 1.0000 | ORAL_TABLET | Freq: Four times a day (QID) | ORAL | 0 refills | Status: DC | PRN

## 2018-10-25 NOTE — Discharge Instructions (Addendum)
Please take the medications as prescribed. You need to rest lying down as much as possible to improve your back and hip pain.   Call for an appointment to see your doctor in one week for recheck. If your pain persists, an MRI study of your back may be needed to determine if there is a "pinched nerve" that would require neurosurgical intervention. This study is ordered by your doctor.

## 2019-03-11 ENCOUNTER — Emergency Department (HOSPITAL_COMMUNITY)
Admission: EM | Admit: 2019-03-11 | Discharge: 2019-03-11 | Disposition: A | Discharge status: Home / Self Care | Payer: Self-pay | Attending: Emergency Medicine | Admitting: Emergency Medicine

## 2019-03-11 ENCOUNTER — Emergency Department (HOSPITAL_COMMUNITY): Payer: Self-pay

## 2019-03-11 ENCOUNTER — Emergency Department (HOSPITAL_BASED_OUTPATIENT_CLINIC_OR_DEPARTMENT_OTHER): Payer: Self-pay

## 2019-03-11 ENCOUNTER — Other Ambulatory Visit: Payer: Self-pay

## 2019-03-11 DIAGNOSIS — Z79899 Other long term (current) drug therapy: Secondary | ICD-10-CM | POA: Insufficient documentation

## 2019-03-11 DIAGNOSIS — M7989 Other specified soft tissue disorders: Secondary | ICD-10-CM

## 2019-03-11 DIAGNOSIS — R52 Pain, unspecified: Secondary | ICD-10-CM

## 2019-03-11 DIAGNOSIS — M79604 Pain in right leg: Secondary | ICD-10-CM | POA: Insufficient documentation

## 2019-03-11 MED ORDER — NAPROXEN 500 MG PO TABS: 500.0000 mg | ORAL_TABLET | Freq: Two times a day (BID) | ORAL | 0 refills | Status: AC

## 2019-03-11 MED ORDER — ACETAMINOPHEN 325 MG PO TABS
650.0000 mg | ORAL_TABLET | Freq: Once | ORAL | Status: AC
  Administered 2019-03-11: 650 mg via ORAL
  Filled 2019-03-11: qty 2

## 2019-03-11 NOTE — Progress Notes (Signed)
Right lower extremity venous duplex completed. Preliminary results in Chart review XCV Proc. [place] Gwenyth Dingee,RVS 03/11/2019, 7:58 PM

## 2019-03-11 NOTE — ED Notes (Signed)
Patient returned from Ultrasound. 

## 2019-03-11 NOTE — Discharge Instructions (Addendum)
You were seen today for pain in your right leg. Please read and follow all provided instructions.    1. Medications: alternate ibuprofen and tylenol for pain control, usual home medications  2. Treatment: rest, ice, elevate and use ace wrap if needed, drink plenty of fluids, gentle stretching  3. Follow Up: Please followup with your PCP in 1 week if no improvement for discussion of your diagnoses and further evaluation after today's visit; if you do not have a primary care doctor use the resource guide provided to find one; Please return to the ER for worsening symptoms or other concerns

## 2019-03-11 NOTE — ED Provider Notes (Signed)
[facility]   CSN: [id number] Arrival date & time: 03/11/19  1721     History   Chief Complaint Chief Complaint  Patient presents with  . Leg Pain    HPI Anna Walls is a 38 y.o. female with past medical history as listed below presents emergency department today with chief complaint of right leg pain.  She states this started yesterday.  She was walking down stairs and when she got to the bottom she stopped to put her coat on.  The next few steps she took she heard a cracking noise that she thinks came from her right leg.  Since hearing the noise she is reporting pain in her right knee that radiates down her right foot.  Describes the pain as sharp.  Pain is worse with movement.  She rates a 8 of 10 in severity.  She denies any fall or injury.  She took ibuprofen last night without symptom relief. When she got off work today she noticed swelling in her right lower leg. She denies fall, fever, chills, numbness, tingling, history of similar pain.   Past Medical History:  Diagnosis Date  . Chronic headaches   . Hyperemesis   . Low back pain   . Pericardial effusion    a. Trace by echo 12/2015.  Marland Kitchen. Pericarditis    a. Post-partum 12/2015.  Marland Kitchen. Preeclampsia in postpartum period   . Sickle cell trait [facility](HCC)     Patient Active Problem List   Diagnosis Date Noted  . Chronic nonintractable headache 05/06/2017  . Tympanic membrane rupture, right 05/06/2017  . Mastitis, right, acute 01/08/2016  . Postoperative pulmonary edema (HCC) 01/07/2016  . Chest pain at rest   . Shortness of breath   . Acute pericarditis   . Preeclampsia in postpartum period 01/06/2016  . Supervision of high risk pregnancy, antepartum 06/25/2015    No past surgical history on file.   OB History    Gravida  5   Para  5   Term  5   Preterm      AB      Living  4     SAB      TAB      Ectopic      Multiple  0   Live Births  4             Home Medications    Prior to Admission medications   Medication Sig Start Date End Date Taking? Authorizing Provider  acetaminophen (TYLENOL) 325 MG tablet Take 2 tablets (650 mg total) by mouth every 6 (six) hours as needed. 08/30/18   Derwood KaplanNanavati, Ankit, MD  albuterol (PROVENTIL HFA;VENTOLIN HFA) 108 (90 Base) MCG/ACT inhaler Inhale 1-2 puffs into the lungs every 6 (six) hours as needed for wheezing or shortness of breath. 12/15/17   Couture, Cortni S, PA-C  amoxicillin (AMOXIL) 500 MG capsule Take 1 capsule (500 mg total) 2 (two) times daily by mouth. Patient not taking: Reported on 08/30/2018 05/19/17   Hedges, Tinnie GensJeffrey, PA-C  Aspirin-Salicylamide-Caffeine (BC HEADACHE PO) Take 1 packet by mouth daily as needed (pain).    [provider]  benzonatate (TESSALON) 100 MG capsule Take 1 capsule (100 mg total) by mouth every 8 (eight) hours. Patient not taking: Reported on 08/30/2018 12/15/17   Couture, Cortni S, PA-C  cetirizine (ZYRTEC) 10 MG tablet Take 10 mg by mouth daily as needed for allergies.     [provider]  colchicine  0.6 MG tablet Take 1 tablet (0.6 mg total) by mouth daily. Patient not taking: Reported on 04/03/2017 01/11/16   Levie HeritageStinson, Jacob J, DO  dicloxacillin (DYNAPEN) 500 MG capsule Take 1 capsule (500 mg total) by mouth every 6 (six) hours. Patient not taking: Reported on 04/03/2017 01/11/16   Levie HeritageStinson, Jacob J, DO  fluticasone [facility](FLONASE) 50 MCG/ACT nasal spray Place 2 sprays into both nostrils daily. Patient not taking: Reported on 08/30/2018 12/15/17   Couture, Cortni S, PA-C  HYDROcodone-acetaminophen (NORCO/VICODIN) 5-325 MG tablet Take 1 tablet by mouth every 6 (six) hours as needed for severe pain. 10/25/18   Elpidio AnisUpstill, Shari, PA-C  ibuprofen (ADVIL,MOTRIN) 600 MG tablet Take 1 tablet (600 mg total) by mouth every 6 (six) hours. Patient not taking: Reported on 05/19/2017 04/04/17   Emi HolesLaw, Alexandra M, PA-C  naproxen (NAPROSYN) 375 MG tablet Take 1 tablet (375 mg total) by  mouth 3 (three) times daily with meals. 10/25/18   Elpidio AnisUpstill, Shari, PA-C  NIFEdipine (PROCARDIA-XL/ADALAT CC) 30 MG 24 hr tablet Take 1 tablet (30 mg total) by mouth 2 (two) times daily. Patient not taking: Reported on 04/03/2017 01/11/16   Levie HeritageStinson, Jacob J, DO  Prenatal Vit-Fe Fumarate-FA (PRENATAL COMPLETE) 14-0.4 MG TABS Take 1 tablet by mouth daily. Patient not taking: Reported on 04/03/2017 09/26/15   Leftwich-Kirby, Wilmer FloorLisa A, CNM  pseudoephedrine (SUDAFED) 30 MG tablet Take 1 tablet (30 mg total) by mouth daily. Patient taking differently: Take 30 mg by mouth every 8 (eight) hours as needed for congestion.  01/11/16   Levie HeritageStinson, Jacob J, DO  ranitidine (ZANTAC) 150 MG capsule Take 1 capsule (150 mg total) by mouth 2 (two) times daily. Patient not taking: Reported on 04/03/2017 12/19/15   Poe, Deirdre C, CNM  sulfamethoxazole-trimethoprim (BACTRIM DS,SEPTRA DS) 800-160 MG tablet Take 1 tablet by mouth every 12 (twelve) hours. Patient not taking: Reported on 04/03/2017 01/11/16   Levie HeritageStinson, Jacob J, DO  vitamin B-12 (CYANOCOBALAMIN) 1000 MCG tablet Take 1,000 mcg by mouth daily.    [provider]    Family History Family History  Family history unknown: Yes    Social History Social History   Tobacco Use  . Smoking status: Never Smoker  . Smokeless tobacco: Never Used  Substance Use Topics  . Alcohol use: No  . Drug use: No     Allergies   Patient has no known allergies.   Review of Systems Review of Systems  Constitutional: Negative for chills and fever.  Respiratory: Negative for chest tightness and shortness of breath.   Cardiovascular: Positive for leg swelling. Negative for chest pain and palpitations.  Musculoskeletal: Positive for arthralgias. Negative for gait problem, joint swelling and myalgias.  Skin: Negative for color change, rash and wound.  Neurological: Negative for weakness and numbness.     Physical Exam Updated Vital Signs BP 127/87 (BP Location: Right  Arm)   Pulse 94   Temp 98.3 F (36.8 C) (Oral)   Resp 16   Ht 5\' 4"  (1.626 m)   Wt 91.6 kg   LMP 03/07/2019   SpO2 99%   BMI 34.67 kg/m   Physical Exam Vitals signs and nursing note reviewed.  Constitutional:      General: She is not in acute distress.    Appearance: She is not ill-appearing.  HENT:     Head: Normocephalic and atraumatic.     Right Ear: Tympanic membrane and external ear normal.     Left Ear: Tympanic membrane and external ear normal.  Nose: Nose normal.     Mouth/Throat:     Mouth: Mucous membranes are moist.     Pharynx: Oropharynx is clear.  Eyes:     General: No scleral icterus.       Right eye: No discharge.        Left eye: No discharge.     Extraocular Movements: Extraocular movements intact.     Conjunctiva/sclera: Conjunctivae normal.     Pupils: Pupils are equal, round, and reactive to light.  Neck:     Musculoskeletal: Normal range of motion.     Vascular: No JVD.  Cardiovascular:     Rate and Rhythm: Normal rate and regular rhythm.     Pulses: Normal pulses.          Radial pulses are 2+ on the right side and 2+ on the left side.     Heart sounds: Normal heart sounds.  Pulmonary:     Comments: Lungs clear to auscultation in all fields. Symmetric chest rise. No wheezing, rales, or rhonchi. Abdominal:     Comments: Abdomen is soft, non-distended, and non-tender in all quadrants. No rigidity, no guarding. No peritoneal signs.  Musculoskeletal: Normal range of motion.     Right hip: Normal.     Right knee: She exhibits bony tenderness. She exhibits no swelling, no effusion, no ecchymosis, no deformity, no laceration and no erythema. No tenderness found. No medial joint line, no lateral joint line, no MCL, no LCL and no patellar tendon tenderness noted.     Right ankle: She exhibits normal range of motion and no deformity. Tenderness. Lateral malleolus tenderness found. No head of 5th metatarsal and no proximal fibula tenderness found.      Right lower leg: Edema present.     Left lower leg: No edema.     Comments: Negative anterior and posterior drawer test on right knee. Pt ambulates with mildly antalgic gait.  Homans sign absent on right lower extremity, no palpable cords, compartment is soft   Skin:    General: Skin is warm and dry.     Capillary Refill: Capillary refill takes less than 2 seconds.  Neurological:     Mental Status: She is oriented to person, place, and time.     GCS: GCS eye subscore is 4. GCS verbal subscore is 5. GCS motor subscore is 6.     Comments: Fluent speech, no facial droop.  Psychiatric:        Behavior: Behavior normal.      ED Treatments / Results  Labs (all labs ordered are listed, but only abnormal results are displayed) Labs Reviewed - No data to display  EKG None  Radiology Dg Ankle Complete Right  Result Date: 03/11/2019 CLINICAL DATA:  Pain after walking last night. EXAM: RIGHT ANKLE - COMPLETE 3+ VIEW COMPARISON:  None. FINDINGS: There is no evidence of fracture, dislocation, or joint effusion. There is no evidence of arthropathy or other focal bone abnormality. Soft tissues are unremarkable. IMPRESSION: Negative. Electronically Signed   By: Gerome Sam III M.D   On: 03/11/2019 18:55   Dg Knee Complete 4 Views Right  Result Date: 03/11/2019 CLINICAL DATA:  Pain EXAM: RIGHT KNEE - COMPLETE 4+ VIEW COMPARISON:  None. FINDINGS: No evidence of fracture, dislocation, or joint effusion. No evidence of arthropathy or other focal bone abnormality. Soft tissues are unremarkable. IMPRESSION: Negative. Electronically Signed   By: Charlett Nose M.D.   On: 03/11/2019 18:55    Procedures Procedures (including critical  care time)  Medications Ordered in ED Medications  acetaminophen (TYLENOL) tablet 650 mg (650 mg Oral Given 03/11/19 1833)     Initial Impression / Assessment and Plan / ED Course  I have reviewed the triage vital signs and the nursing notes.  Pertinent labs &  imaging results that were available during my care of the patient were reviewed by me and considered in my medical decision making (see chart for details).  Patient presents to the ED with complaints of pain to the right leg without known injury. Exam without obvious deformity or open wounds. ROM intact. She has mild swelling to lateral right ankle. Llateral malleolus tender to palpation, medial malleolus and 5th metatarsal are non tender.  NVI distally. Xray of right knee and ankle are  negative for fracture/dislocation. Pt has mild swelling to right calf, negative homans sign. However, pt is on birth control so can not rule out for PE. Low suspicion for this but will evaluate with [place].  Patient care transferred to J. Soto PA-C at the end of my shift. Patient presentation, ED course, and plan of care discussed with review of all pertinent labs and imaging. Please see her note for further details regarding further ED course and disposition. If [place] is negative, suspect she will be discharged home with symptomatic treatment.   This note was prepared using Dragon voice recognition software and may include unintentional dictation errors due to the inherent limitations of voice recognition software.   Final Clinical Impressions(s) / ED Diagnoses   Final diagnoses:  Right leg pain    ED Discharge Orders    None       Flint Melter 03/11/19 1953    Isla Pence, MD 03/11/19 2324

## 2019-03-11 NOTE — ED Provider Notes (Signed)
  Physical Exam  BP 137/83 (BP Location: Right Arm)   Pulse 81   Temp 98.3 F (36.8 C) (Oral)   Resp 16   Ht 5\' 4"  (1.626 m)   Wt 91.6 kg   LMP 03/07/2019   SpO2 98%   BMI 34.67 kg/m   Physical Exam  ED Course/Procedures     Procedures  MDM   Patient care assumed from [place]. Briese, PA at shift change, please see her note for full HPI.  Briefly, patient here with right knee pain after hearing a popping sensation to her right leg.  X-ray of her knee and ankle were within normal limits.  An ultrasound was obtained as patient is currently on birth control although no tachycardia on ED visit. DVT study was also negative for any deep venous thrombosis.  8:10 PM I have discussed these results with patient, she will go home on symptomatic treatment with naproxen along with rice therapy.  We will also provide patient with a knee sleeve.  Patient understands and agrees with management.  Return precautions provided at length.  With stable vital signs, afebrile patient stable for discharge.  Portions of this note were generated with [employer]. Dictation errors may occur despite best attempts at proofreading.      Janeece Fitting, PA-C 03/11/19 2015    Isla Pence, MD 03/11/19 2018

## 2019-03-11 NOTE — ED Triage Notes (Signed)
Pt presents to the ED with right leg pain that started last night when she was walking. Pt reports she heard a "crack" in her right leg and since then she has had increased pain in her right knee that radiates down into her foot. Pt does have some swelling in her right foot. Pt also reports that she has been having increased itchiness in her face for the last two weeks from her allergies. No obvious injury or deformity noted to right lower extremity, pulse, motor and sensation intact.

## 2019-04-10 ENCOUNTER — Other Ambulatory Visit: Payer: Self-pay

## 2019-04-10 ENCOUNTER — Emergency Department (HOSPITAL_COMMUNITY)
Admission: EM | Admit: 2019-04-10 | Discharge: 2019-04-11 | Disposition: A | Discharge status: Home / Self Care | Payer: Self-pay | Attending: Emergency Medicine | Admitting: Emergency Medicine

## 2019-04-10 ENCOUNTER — Encounter (HOSPITAL_COMMUNITY): Payer: Self-pay | Admitting: Emergency Medicine

## 2019-04-10 ENCOUNTER — Emergency Department (HOSPITAL_COMMUNITY): Payer: Self-pay

## 2019-04-10 DIAGNOSIS — G43809 Other migraine, not intractable, without status migrainosus: Secondary | ICD-10-CM | POA: Insufficient documentation

## 2019-04-10 DIAGNOSIS — Z7982 Long term (current) use of aspirin: Secondary | ICD-10-CM | POA: Insufficient documentation

## 2019-04-10 DIAGNOSIS — Z79899 Other long term (current) drug therapy: Secondary | ICD-10-CM | POA: Insufficient documentation

## 2019-04-10 MED ORDER — IBUPROFEN 400 MG PO TABS
400.0000 mg | ORAL_TABLET | Freq: Once | ORAL | Status: AC
  Administered 2019-04-10: 400 mg via ORAL
  Filled 2019-04-10: qty 1

## 2019-04-10 MED ORDER — SODIUM CHLORIDE 0.9 % IV BOLUS: 1000.0000 mL | Freq: Once | INTRAVENOUS | Status: DC

## 2019-04-10 MED ORDER — DIPHENHYDRAMINE HCL 50 MG/ML IJ SOLN: 12.5000 mg | Freq: Once | INTRAMUSCULAR | Status: DC

## 2019-04-10 MED ORDER — ACETAMINOPHEN 325 MG PO TABS
650.0000 mg | ORAL_TABLET | Freq: Once | ORAL | Status: AC
  Administered 2019-04-10: 650 mg via ORAL
  Filled 2019-04-10: qty 2

## 2019-04-10 MED ORDER — METOCLOPRAMIDE HCL 5 MG/ML IJ SOLN: 10.0000 mg | Freq: Once | INTRAMUSCULAR | Status: DC

## 2019-04-10 NOTE — ED Notes (Signed)
Pt given extended education regarding follow up, medication and migraine triggers. Pt provided work note for tomorrow per her request.  Pt verbalized understanding

## 2019-04-10 NOTE — ED Notes (Signed)
In to start IV and medicate patient. Pt would like to have CT or xray of her head before getting IV or medication, pt states in the past the pain returns after meds. Pt has not followed up with neuro or PCP.

## 2019-04-10 NOTE — Discharge Instructions (Signed)
Continue your home medications as previously prescribed. Follow-up with the neurologist listed below for further evaluation of your migraines. Return to the ED if you start to experience worsening symptoms, numbness in arms or legs, blurry vision, neck stiffness with fever.

## 2019-04-10 NOTE — ED Triage Notes (Signed)
Pt to ED with c/o migraine headache x's 1 week.  Pt st's she has been taking her meds without relief.  Nausea without vomiting.

## 2019-04-10 NOTE — ED Provider Notes (Signed)
[facility]PART[facility] Provider Note   CSN: [id number] Arrival date & time: 04/10/19  1806     History   Chief Complaint Chief Complaint  Patient presents with  . Migraine    HPI Anna Walls is a 38 y.o. female with a past medical history of sickle cell trait, chronic headaches, presents to ED for intermittent migraine for the past week.  Reports pain throughout her entire head with associated nausea, photophobia and phonophobia.  States that she has been taking Goody powders, Fioricet and Excedrin's with improvement in her symptoms but then her headache will return.  States that her symptoms feel similar to her prior migraines.  States that she will get migraines several times a month.  She has never been evaluated by neurology.  She denies any numbness in arms or legs, fever, neck stiffness, vomiting, injuries or falls, anticoagulant use. She denies possibility of pregnancy.      HPI  Past Medical History:  Diagnosis Date  . Chronic headaches   . Hyperemesis   . Low back pain   . Pericardial effusion    a. Trace by echo 12/2015.  Marland Kitchen Pericarditis    a. Post-partum 12/2015.  Marland Kitchen Preeclampsia in postpartum period   . Sickle cell trait [facility])     Patient Active Problem List   Diagnosis Date Noted  . Chronic nonintractable headache 05/06/2017  . Tympanic membrane rupture, right 05/06/2017  . Mastitis, right, acute 01/08/2016  . Postoperative pulmonary edema ([place]) 01/07/2016  . Chest pain at rest   . Shortness of breath   . Acute pericarditis   . Preeclampsia in postpartum period 01/06/2016  . Supervision of high risk pregnancy, antepartum 06/25/2015    History reviewed. No pertinent surgical history.   OB History    Gravida  5   Para  5   Term  5   Preterm      AB      Living  4     SAB      TAB      Ectopic      Multiple  0   Live Births  4            Home Medications    Prior to Admission medications    Medication Sig Start Date End Date Taking? Authorizing Provider  acetaminophen (TYLENOL) 325 MG tablet Take 2 tablets (650 mg total) by mouth every 6 (six) hours as needed. 08/30/18   Varney Biles, MD  albuterol (PROVENTIL HFA;VENTOLIN HFA) 108 (90 Base) MCG/ACT inhaler Inhale 1-2 puffs into the lungs every 6 (six) hours as needed for wheezing or shortness of breath. 12/15/17   Couture, Cortni S, PA-C  amoxicillin (AMOXIL) 500 MG capsule Take 1 capsule (500 mg total) 2 (two) times daily by mouth. Patient not taking: Reported on 08/30/2018 05/19/17   Hedges, Dellis Filbert, PA-C  Aspirin-Salicylamide-Caffeine (BC HEADACHE PO) Take 1 packet by mouth daily as needed (pain).    [provider]  benzonatate (TESSALON) 100 MG capsule Take 1 capsule (100 mg total) by mouth every 8 (eight) hours. Patient not taking: Reported on 08/30/2018 12/15/17   Couture, Cortni S, PA-C  cetirizine (ZYRTEC) 10 MG tablet Take 10 mg by mouth daily as needed for allergies.     [provider]  colchicine 0.6 MG tablet Take 1 tablet (0.6 mg total) by mouth daily. Patient not taking: Reported on 04/03/2017 01/11/16   Truett Mainland, DO  dicloxacillin (DYNAPEN) 500 MG capsule  Take 1 capsule (500 mg total) by mouth every 6 (six) hours. Patient not taking: Reported on 04/03/2017 01/11/16   Levie Heritage, DO  fluticasone [facility]) 50 MCG/ACT nasal spray Place 2 sprays into both nostrils daily. Patient not taking: Reported on 08/30/2018 12/15/17   Couture, Cortni S, PA-C  HYDROcodone-acetaminophen (NORCO/VICODIN) 5-325 MG tablet Take 1 tablet by mouth every 6 (six) hours as needed for severe pain. 10/25/18   Elpidio Anis, PA-C  ibuprofen (ADVIL,MOTRIN) 600 MG tablet Take 1 tablet (600 mg total) by mouth every 6 (six) hours. Patient not taking: Reported on 05/19/2017 04/04/17   Emi Holes, PA-C  NIFEdipine (PROCARDIA-XL/ADALAT CC) 30 MG 24 hr tablet Take 1 tablet (30 mg total) by mouth 2 (two) times daily. Patient not  taking: Reported on 04/03/2017 01/11/16   Levie Heritage, DO  Prenatal Vit-Fe Fumarate-FA (PRENATAL COMPLETE) 14-0.4 MG TABS Take 1 tablet by mouth daily. Patient not taking: Reported on 04/03/2017 09/26/15   Leftwich-Kirby, Wilmer Floor, CNM  pseudoephedrine (SUDAFED) 30 MG tablet Take 1 tablet (30 mg total) by mouth daily. Patient taking differently: Take 30 mg by mouth every 8 (eight) hours as needed for congestion.  01/11/16   Levie Heritage, DO  ranitidine (ZANTAC) 150 MG capsule Take 1 capsule (150 mg total) by mouth 2 (two) times daily. Patient not taking: Reported on 04/03/2017 12/19/15   Poe, Deirdre C, CNM  sulfamethoxazole-trimethoprim (BACTRIM DS,SEPTRA DS) 800-160 MG tablet Take 1 tablet by mouth every 12 (twelve) hours. Patient not taking: Reported on 04/03/2017 01/11/16   Levie Heritage, DO  vitamin B-12 (CYANOCOBALAMIN) 1000 MCG tablet Take 1,000 mcg by mouth daily.    [provider]    Family History Family History  Family history unknown: Yes    Social History Social History   Tobacco Use  . Smoking status: Never Smoker  . Smokeless tobacco: Never Used  Substance Use Topics  . Alcohol use: No  . Drug use: No     Allergies   Patient has no known allergies.   Review of Systems Review of Systems  Constitutional: Negative for appetite change, chills and fever.  HENT: Negative for ear pain, rhinorrhea, sneezing and sore throat.   Eyes: Positive for photophobia. Negative for visual disturbance.  Respiratory: Negative for cough, chest tightness, shortness of breath and wheezing.   Cardiovascular: Negative for chest pain and palpitations.  Gastrointestinal: Positive for nausea. Negative for abdominal pain, blood in stool, constipation, diarrhea and vomiting.  Genitourinary: Negative for dysuria, hematuria and urgency.  Musculoskeletal: Negative for myalgias.  Skin: Negative for rash.  Neurological: Positive for headaches. Negative for dizziness, weakness and  light-headedness.     Physical Exam Updated Vital Signs BP (!) 146/95   Pulse 87   Temp 98.1 F (36.7 C) (Oral)   Resp 18   Ht 5\' 4"  (1.626 m)   Wt 90.7 kg   LMP 04/04/2019 (Exact Date)   SpO2 98%   BMI 34.33 kg/m   Physical Exam Vitals signs and nursing note reviewed.  Constitutional:      General: She is not in acute distress.    Appearance: She is well-developed.     Comments: Ambulatory without difficulty.  HENT:     Head: Normocephalic and atraumatic.     Nose: Nose normal.  Eyes:     General: No scleral icterus.       Right eye: No discharge.        Left eye: No discharge.  Conjunctiva/sclera: Conjunctivae normal.     Pupils: Pupils are equal, round, and reactive to light.  Neck:     Musculoskeletal: Normal range of motion and neck supple.  Cardiovascular:     Rate and Rhythm: Normal rate and regular rhythm.     Heart sounds: Normal heart sounds. No murmur. No friction rub. No gallop.   Pulmonary:     Effort: Pulmonary effort is normal. No respiratory distress.     Breath sounds: Normal breath sounds.  Abdominal:     General: Bowel sounds are normal. There is no distension.     Palpations: Abdomen is soft.     Tenderness: There is no abdominal tenderness. There is no guarding.  Musculoskeletal: Normal range of motion.  Skin:    General: Skin is warm and dry.     Findings: No rash.  Neurological:     General: No focal deficit present.     Mental Status: She is alert and oriented to person, place, and time. Mental status is at baseline.     Cranial Nerves: No cranial nerve deficit.     Sensory: No sensory deficit.     Motor: No weakness or abnormal muscle tone.     Coordination: Coordination normal.     Comments: Alert and oriented x4. Pupils reactive. No facial asymmetry noted. Cranial nerves appear grossly intact. Sensation intact to light touch on face, BUE and BLE. Strength 5/5 in BUE and BLE.      ED Treatments / Results  Labs (all labs  ordered are listed, but only abnormal results are displayed) Labs Reviewed - No data to display  EKG None  Radiology Ct Head Wo Contrast  Result Date: 04/10/2019 CLINICAL DATA:  Severe headache EXAM: CT HEAD WITHOUT CONTRAST TECHNIQUE: Contiguous axial images were obtained from the base of the skull through the vertex without intravenous contrast. COMPARISON:  None. FINDINGS: Brain: No evidence of acute infarction, hemorrhage, hydrocephalus, extra-axial collection or mass lesion/mass effect. Vascular: No hyperdense vessel or unexpected calcification. Skull: Normal. Negative for fracture or focal lesion. Sinuses/Orbits: Mucosal thickening in the ethmoid and maxillary sinuses. Other: None IMPRESSION: Negative non contrasted CT appearance of the brain Electronically Signed   By: Jasmine PangKim  Fujinaga M.D.   On: 04/10/2019 22:55    Procedures Procedures (including critical care time)  Medications Ordered in ED Medications  acetaminophen (TYLENOL) tablet 650 mg (has no administration in time range)  ibuprofen (ADVIL) tablet 400 mg (has no administration in time range)     Initial Impression / Assessment and Plan / ED Course  I have reviewed the triage vital signs and the nursing notes.  Pertinent labs & imaging results that were available during my care of the patient were reviewed by me and considered in my medical decision making (see chart for details).        38 year old female with a past medical history of migraines presents to ED for intermittent migraine for the past week.  She has been taking NSAIDs, Fioricet with improvement in her symptoms but then has the headache again.  Reports associated nausea, photophobia and phonophobia.  States that this feels similar to her typical migraines.  She has not been evaluated by neurology.  She denies any head injuries or falls, numbness in arms or legs, neck stiffness or fever.  On my exam patient is overall well-appearing.  No deficits neurological  exam noted.  No facial asymmetry, vital signs are within normal limits. Will give migraine cocktail and reassess.  Patient  requests that we obtain CT of her head.  11:07 PM CT returned as unremarkable.  I went to discuss the patient about receiving IV medications.  States that she does not want to receive IV medications because "my headache just comes back after I get them."  She also reports feeling "like my body is minty after I get these medications." Patient unable to explain what she means by this. Nevertheless, I asked her if she would want to receive the migraine cocktail or would just do p.o. medications.  She requests that we give her p.o. medications as well as neurology follow-up. There are no headache characteristics that are lateralizing or concerning for increased ICP, infectious or vascular cause of her symptoms. Feel outpatient f/u is reasonable as her migraines have been occurring more often, she has a normal neuro exam and vitals.  Patient agreeable to plan.  We will have her return for worsening symptoms.  Patient is hemodynamically stable, in NAD, and able to ambulate in the ED. Evaluation does not show pathology that would require ongoing emergent intervention or inpatient treatment. I explained the diagnosis to the patient. Pain has been managed and has no complaints prior to discharge. Patient is comfortable with above plan and is stable for discharge at this time. All questions were answered prior to disposition. Strict return precautions for returning to the ED were discussed. Encouraged follow up with PCP.   An After Visit Summary was printed and given to the patient.   Portions of this note were generated with Scientist, clinical (histocompatibility and immunogenetics). Dictation errors may occur despite best attempts at proofreading.  Final Clinical Impressions(s) / ED Diagnoses   Final diagnoses:  Other migraine without status migrainosus, not intractable    ED Discharge Orders         Ordered     Ambulatory referral to Neurology    Comments: An appointment is requested in approximately: 1 week   04/10/19 2306           Dietrich Pates, PA-C 04/10/19 2310    Charlynne Pander, MD 04/19/19 [phone]

## 2019-05-24 ENCOUNTER — Encounter: Payer: Self-pay | Admitting: Neurology

## 2019-05-24 ENCOUNTER — Ambulatory Visit: Payer: Self-pay | Admitting: Neurology

## 2019-06-13 ENCOUNTER — Other Ambulatory Visit: Payer: Self-pay

## 2019-06-13 ENCOUNTER — Ambulatory Visit (INDEPENDENT_AMBULATORY_CARE_PROVIDER_SITE_OTHER): Payer: 59 | Admitting: Neurology

## 2019-06-13 ENCOUNTER — Encounter: Payer: Self-pay | Admitting: Neurology

## 2019-06-13 VITALS — BP 140/70 | HR 68 | Temp 97.6°F | Ht 64.0 in | Wt 213.0 lb

## 2019-06-13 DIAGNOSIS — E669 Obesity, unspecified: Secondary | ICD-10-CM | POA: Diagnosis not present

## 2019-06-13 DIAGNOSIS — R0683 Snoring: Secondary | ICD-10-CM | POA: Diagnosis not present

## 2019-06-13 DIAGNOSIS — G43019 Migraine without aura, intractable, without status migrainosus: Secondary | ICD-10-CM

## 2019-06-13 DIAGNOSIS — R519 Headache, unspecified: Secondary | ICD-10-CM

## 2019-06-13 MED ORDER — RIZATRIPTAN BENZOATE 10 MG PO TBDP: 10.0000 mg | ORAL_TABLET | ORAL | 3 refills | Status: DC | PRN

## 2019-06-13 MED ORDER — TOPIRAMATE 50 MG PO TABS: ORAL_TABLET | ORAL | 5 refills | Status: DC

## 2019-06-13 NOTE — Progress Notes (Signed)
Subjective:    Patient ID: Anna Walls is a 38 y.o. female.  HPI     Star Age, MD, PhD [facility] [address], [address] [place], [place] [address]  I saw patient, Anna Walls, as a referral from the emergency room for evaluation of her recurrent headaches, concern for migraines.  The patient is unaccompanied today. Anna Walls is a 38 year old right-handed woman with an underlying medical history of sickle cell trait, low back pain, history of preeclampsia, history of pericarditis and pericardial effusion in 2017, and obesity, who presented to the emergency room on 04/10/2019 with recurrent migrainous headaches.  I reviewed the emergency room records.  She had a head CT without contrast on 04/10/2019 and I reviewed the results: IMPRESSION: Negative non contrasted CT appearance of the brain. She declined IV medications, i.e. headache cocktail at the time.  She was treated symptomatically with p.o. medication including ibuprofen and Tylenol.  She denies being pregnant, she reports having had migrainous headaches for the past 10 years, she does not have a consistent PCP.  She did not think Imitrex helped, she took it a few times, she has tried ondansetron for nausea but does not typically have any vomiting and does not request any nausea medicine at this time.  She reports throbbing headache, sometimes it is bilateral, sometimes it is one-sided, throbbing typically but also more sharp and stabbing at times.  She has associated nausea but rare vomiting.  She does not sleep well.  She snores, at times loudly.  She has woken up with a headache.  She had a sleep study some years ago and was not told she needed CPAP treatment.  She tries to hydrate well with water, she is not aware of any family history of migraines.  She drinks caffeine and limitation, typically in the form of hot tea with lemon, 1 or 2 cups/day.  She has a migraine headache about 3 times per  week on average, in the past, it was more infrequent.  She has associated photophobia and sometimes sonophobia, she patient about 4 months ago and had an updated prescription on her eyeglasses.    Her Past Medical History Is Significant For: Past Medical History:  Diagnosis Date  . Chronic headaches   . Hyperemesis   . Low back pain   . Pericardial effusion    a. Trace by echo 12/2015.  Marland Kitchen Pericarditis    a. Post-partum 12/2015.  Marland Kitchen Preeclampsia in postpartum period   . Sickle cell trait ([place])     Her Past Surgical History Is Significant For: History reviewed. No pertinent surgical history.  Her Family History Is Significant For: Family History  Family history unknown: Yes    Her Social History Is Significant For: Social History   Socioeconomic History  . Marital status: Married    Spouse name: Not on file  . Number of children: Not on file  . Years of education: Not on file  . Highest education level: Not on file  Occupational History  . Not on file  Social Needs  . Financial resource strain: Not on file  . Food insecurity    Worry: Not on file    Inability: Not on file  . Transportation needs    Medical: Not on file    Non-medical: Not on file  Tobacco Use  . Smoking status: Never Smoker  . Smokeless tobacco: Never Used  Substance and Sexual Activity  . Alcohol use: No  . Drug  use: No  . Sexual activity: Not on file  Lifestyle  . Physical activity    Days per week: Not on file    Minutes per session: Not on file  . Stress: Not on file  Relationships  . Social Musician on phone: Not on file    Gets together: Not on file    Attends religious service: Not on file    Active member of club or organization: Not on file    Attends meetings of clubs or organizations: Not on file    Relationship status: Not on file  Other Topics Concern  . Not on file  Social History Narrative  . Not on file    Her Allergies Are:  No Known Allergies:   Her  Current Medications Are:  Outpatient Encounter Medications as of 06/13/2019  Medication Sig  . acetaminophen (TYLENOL) 325 MG tablet Take 650 mg by mouth every 6 (six) hours as needed.  Marland Kitchen ibuprofen (ADVIL,MOTRIN) 600 MG tablet Take 1 tablet (600 mg total) by mouth every 6 (six) hours. (Patient taking differently: Take 800 mg by mouth every 6 (six) hours. )  . rizatriptan (MAXALT-MLT) 10 MG disintegrating tablet Take 1 tablet (10 mg total) by mouth as needed for migraine. May repeat in 2 hours if needed  . topiramate (TOPAMAX) 50 MG tablet 1/2 pill each bedtime x 1 week, then 1 pill nightly x 1 week, then 1-1/2 pills nightly x 1 week, then 2 pills nightly thereafter.  . [DISCONTINUED] acetaminophen (TYLENOL) 325 MG tablet Take 2 tablets (650 mg total) by mouth every 6 (six) hours as needed. (Patient not taking: Reported on 06/13/2019)  . [DISCONTINUED] albuterol (PROVENTIL HFA;VENTOLIN HFA) 108 (90 Base) MCG/ACT inhaler Inhale 1-2 puffs into the lungs every 6 (six) hours as needed for wheezing or shortness of breath. (Patient not taking: Reported on 06/13/2019)  . [DISCONTINUED] amoxicillin (AMOXIL) 500 MG capsule Take 1 capsule (500 mg total) 2 (two) times daily by mouth. (Patient not taking: Reported on 06/13/2019)  . [DISCONTINUED] Aspirin-Salicylamide-Caffeine (BC HEADACHE PO) Take 1 packet by mouth daily as needed (pain).  . [DISCONTINUED] benzonatate (TESSALON) 100 MG capsule Take 1 capsule (100 mg total) by mouth every 8 (eight) hours. (Patient not taking: Reported on 06/13/2019)  . [DISCONTINUED] cetirizine (ZYRTEC) 10 MG tablet Take 10 mg by mouth daily as needed for allergies.   . [DISCONTINUED] colchicine 0.6 MG tablet Take 1 tablet (0.6 mg total) by mouth daily. (Patient not taking: Reported on 06/13/2019)  . [DISCONTINUED] cyclobenzaprine (FLEXERIL) 10 MG tablet Take by mouth.  . [DISCONTINUED] dicloxacillin (DYNAPEN) 500 MG capsule Take 1 capsule (500 mg total) by mouth every 6 (six)  hours. (Patient not taking: Reported on 06/13/2019)  . [DISCONTINUED] fluticasone (FLONASE) 50 MCG/ACT nasal spray Place 2 sprays into both nostrils daily. (Patient not taking: Reported on 06/13/2019)  . [DISCONTINUED] HYDROcodone-acetaminophen (NORCO/VICODIN) 5-325 MG tablet Take 1 tablet by mouth every 6 (six) hours as needed for severe pain. (Patient not taking: Reported on 06/13/2019)  . [DISCONTINUED] NIFEdipine (PROCARDIA-XL/ADALAT CC) 30 MG 24 hr tablet Take 1 tablet (30 mg total) by mouth 2 (two) times daily. (Patient not taking: Reported on 06/13/2019)  . [DISCONTINUED] Prenatal Vit-Fe Fumarate-FA (PRENATAL COMPLETE) 14-0.4 MG TABS Take 1 tablet by mouth daily. (Patient not taking: Reported on 06/13/2019)  . [DISCONTINUED] pseudoephedrine (SUDAFED) 30 MG tablet Take 1 tablet (30 mg total) by mouth daily. (Patient not taking: Reported on 06/13/2019)  . [DISCONTINUED] ranitidine (ZANTAC) 150  MG capsule Take 1 capsule (150 mg total) by mouth 2 (two) times daily. (Patient not taking: Reported on 06/13/2019)  . [DISCONTINUED] sulfamethoxazole-trimethoprim (BACTRIM DS,SEPTRA DS) 800-160 MG tablet Take 1 tablet by mouth every 12 (twelve) hours. (Patient not taking: Reported on 06/13/2019)  . [DISCONTINUED] SUMAtriptan (IMITREX) 100 MG tablet Take by mouth.  . [DISCONTINUED] vitamin B-12 (CYANOCOBALAMIN) 1000 MCG tablet Take 1,000 mcg by mouth daily.  . [DISCONTINUED] vitamin B-12 (CYANOCOBALAMIN) 1000 MCG tablet Take by mouth.   No facility-administered encounter medications on file as of 06/13/2019.   :   Review of Systems:  Out of a complete 14 point review of systems, all are reviewed and negative with the exception of these symptoms as listed below:  Review of Systems  Constitutional: Positive for fatigue.  HENT: Negative.   Eyes: Positive for itching.  Respiratory: Negative.   Cardiovascular: Positive for leg swelling.  Gastrointestinal: Negative.   Endocrine: Negative.    Genitourinary: Negative.   Musculoskeletal: Positive for back pain, joint swelling, neck pain and neck stiffness.  Skin: Negative.   Allergic/Immunologic: Negative.   Neurological: Positive for headaches.  Hematological: Negative.   Psychiatric/Behavioral: Positive for sleep disturbance.    Objective:  Neurological Exam  Physical Exam Physical Examination:   Vitals:   06/13/19 0956  BP: 140/70  Pulse: 68  Temp: 97.6 F (36.4 C)    General Examination: The patient is a very pleasant 38 y.o. female in no acute distress. She appears well-developed and well-nourished and well groomed.   HEENT: Normocephalic, atraumatic, pupils are equal, round and reactive to light and accommodation. Funduscopic exam is normal with sharp disc margins noted. Extraocular tracking is good without limitation to gaze excursion or nystagmus noted. Normal smooth pursuit is noted. Hearing is grossly intact. Face is symmetric with normal facial animation and normal facial sensation. Speech is clear with no dysarthria noted. There is no hypophonia. There is no lip, neck/head, jaw or voice tremor. Neck is supple with full range of passive and active motion. There are no carotid bruits on auscultation. Oropharynx exam reveals: mild mouth dryness, adequate dental hygiene and moderate airway crowding, due to Small airway entry, Mallampati class III, tonsils not fully visualized and uvula tip not fully visualized.  Tongue protrudes centrally in palate elevates symmetrically. Neck circumference is 15 inches.  Chest: Clear to auscultation without wheezing, rhonchi or crackles noted.  Heart: S1+S2+0, regular and normal without murmurs, rubs or gallops noted.   Abdomen: Soft, non-tender and non-distended with normal bowel sounds appreciated on auscultation.  Extremities: There is trace pitting edema in the distal lower extremities bilaterally. Pedal pulses are intact.  Skin: Warm and dry without trophic changes  noted.  Musculoskeletal: exam reveals no obvious joint deformities, tenderness or joint swelling or erythema.   Neurologically:  Mental status: The patient is awake, alert and oriented in all 4 spheres. Her immediate and remote memory, attention, language skills and fund of knowledge are appropriate. There is no evidence of aphasia, agnosia, apraxia or anomia. Speech is clear with normal prosody and enunciation. Thought process is linear. Mood is normal and affect is normal.  Cranial nerves II - XII are as described above under HEENT exam. In addition: shoulder shrug is normal with equal shoulder height noted. Motor exam: Normal bulk, strength and tone is noted. There is no drift, tremor or rebound. Romberg is negative. Reflexes are 2+ throughout. Babinski: Toes are flexor bilaterally. Fine motor skills and coordination: intact with normal finger taps, normal hand  movements, normal rapid alternating patting, normal foot taps and normal foot agility.  Cerebellar testing: No dysmetria or intention tremor on finger to nose testing. Heel to shin is unremarkable bilaterally. There is no truncal or gait ataxia.  Sensory exam: intact to light touch, vibration, temperature sense in the upper and lower extremities.  Gait, station and balance: She stands easily. No veering to one side is noted. No leaning to one side is noted. Posture is age-appropriate and stance is narrow based. Gait shows normal stride length and normal pace. No problems turning are noted.   Assessment and Plan:  Assessment and Plan:  In summary, Anna Walls is a very pleasant 38 y.o.-year old female with an underlying medical history of sickle cell trait, low back pain, history of preeclampsia, history of pericarditis and pericardial effusion in 2017, and obesity, who Presents for evaluation of her recurrent headaches of approximately 10 years duration.  Her history is in keeping with migraine without aura, currently intractable.  She  reports about 3 migraine attacks per week.  She has not been on a preventative medication.  She is advised to start Topamax for prevention.  I advised her regarding expectations and side effects and benefits.  For as needed use I suggested another triptan in the form of Maxalt under the tongue.  She is advised regarding potential side effects and how to take the medication on an as-needed basis.  She had a recent head CT without contrast which was unremarkable and physical exam and neurological exam are nonfocal thankfully.  She is reassured in that regard, nevertheless, she may be at risk for underlying obstructive sleep apnea and is advised to undergo sleep study.  We talked about treatment for sleep apnea in the form of AutoPap or CPAP as well.  She will be Contacted by the sleep lab to schedule her sleep test.  She is advised to stay well-hydrated.  She is furthermore advised to establish care with a primary care physician, as she has other complaints such as feeling cold and having cold limbs, swelling in her legs and low back pain.  She also reports some joint pain at times. I provided written instructions and new prescriptions for Topamax starting at 25 mg at night with build up to 100 mg at bedtime.  She also was given a new prescription for Maxalt as needed.  We will also call her with her sleep study results once the test is done.  She is advised to follow-up with one of our nurse practitioners in 3 months, sooner if needed.  I answered all her questions today and she was in agreement with the plan.   Huston Foley, MD, PhD

## 2019-06-13 NOTE — Patient Instructions (Addendum)
You neurological exam is normal.  For your migraines, I recommend daily preventative for in the form of a Medication called Topamax 50 mg: Take half a pill each bedtime for one week, then one pill at bedtime daily for one week, then 1-1/2 pills at bedtime daily for one week, then 2 pills at bedtime daily thereafter. Common side effects reported are: Sedation, sleepiness, tingling, change in taste especially with carbonated drinks, and rare side effects are glaucoma, kidney stones, and problems with thinking including word finding difficulties. For acute migraine, for as needed use, start Maxalt orally disintegrating tab, 10 mg: take 1 pill early on when you suspect a migraine attack come on. You may take another pill within 2 hours, no more than 2 pills in 24 hours. Most people who take triptans do not have any serious side-effects. However, they can cause drowsiness (remember to not drive or use heavy machinery when drowsy), nausea, dizziness, dry mouth. Less common side effects include strange sensations, such as tightness in your chest or throat, tingling, flushing, and feelings of heaviness or pressure in areas such as the face, limbs, and chest. These in the chest can mimic heart related pain (angina) and may cause alarm, but usually these sensations are not harmful or a sign of a heart attack. However, if you develop intense chest pain or sensations of discomfort, you should stop taking your medication and consult with me or your PCP or go to the nearest urgent care facility or ER or call 911.   As discussed, I will also order a sleep study, underlying obstructive sleep apnea may be a possibility in a contributor to your headaches especially morning headaches.  If you have obstructive sleep apnea, You may benefit from using a CPAP machine.  We will call you with your sleep study results.

## 2019-06-13 NOTE — Progress Notes (Signed)
Subjective:    Patient ID: Anna Walls is a 38 y.o. female.  HPI   Review of Systems  Neurological:       Epworth Sleepiness Scale 0= would never doze 1= slight chance of dozing 2= moderate chance of dozing 3= high chance of dozing  Sitting and reading:1 Watching TV:0 Sitting inactive in a public place (ex. Theater or meeting):0 As a passenger in a car for an hour without a break:1 Lying down to rest in the afternoon:1 Sitting and talking to someone:1 Sitting quietly after lunch (no alcohol):1 In a car, while stopped in traffic:0 Total:5

## 2019-06-21 ENCOUNTER — Telehealth: Payer: Self-pay

## 2019-06-21 NOTE — Telephone Encounter (Signed)
We have attempted to call the patient two times to schedule sleep study.  Patient has been unavailable at the phone numbers we have on file and has not returned our calls.  We are unable to leave a message due to voicemail box not set up yet.  If patient calls back we will schedule them for their sleep study.

## 2019-07-11 ENCOUNTER — Encounter (HOSPITAL_COMMUNITY): Payer: Self-pay | Admitting: Emergency Medicine

## 2019-07-11 ENCOUNTER — Other Ambulatory Visit: Payer: Self-pay

## 2019-07-11 ENCOUNTER — Emergency Department (HOSPITAL_COMMUNITY)
Admission: EM | Admit: 2019-07-11 | Discharge: 2019-07-12 | Discharge status: Against Medical Advice | Payer: Self-pay | Attending: Emergency Medicine | Admitting: Emergency Medicine

## 2019-07-11 DIAGNOSIS — Z5321 Procedure and treatment not carried out due to patient leaving prior to being seen by health care provider: Secondary | ICD-10-CM | POA: Insufficient documentation

## 2019-07-11 NOTE — ED Triage Notes (Signed)
Pt arrives to ED from home with complaints of headache, chills, body aches, lost of taste and smell, and congestion since Thursday.

## 2019-07-12 NOTE — ED Notes (Signed)
Pt stated that that the wait was too long and that she would go to her primary in the morning. This NT encouraged the pt to stay and be seen by one of our ED Providers however pt insisted on leaving.

## 2019-09-26 ENCOUNTER — Emergency Department (HOSPITAL_COMMUNITY)
Admission: EM | Admit: 2019-09-26 | Discharge: 2019-09-27 | Disposition: A | Discharge status: Home / Self Care | Payer: Self-pay | Attending: Emergency Medicine | Admitting: Emergency Medicine

## 2019-09-26 ENCOUNTER — Emergency Department (HOSPITAL_COMMUNITY): Payer: Self-pay

## 2019-09-26 ENCOUNTER — Other Ambulatory Visit: Payer: Self-pay

## 2019-09-26 ENCOUNTER — Encounter (HOSPITAL_COMMUNITY): Payer: Self-pay

## 2019-09-26 DIAGNOSIS — Z5321 Procedure and treatment not carried out due to patient leaving prior to being seen by health care provider: Secondary | ICD-10-CM | POA: Insufficient documentation

## 2019-09-26 DIAGNOSIS — R079 Chest pain, unspecified: Secondary | ICD-10-CM | POA: Insufficient documentation

## 2019-09-26 DIAGNOSIS — G43909 Migraine, unspecified, not intractable, without status migrainosus: Secondary | ICD-10-CM | POA: Insufficient documentation

## 2019-09-26 LAB — BASIC METABOLIC PANEL
Anion gap: 11 (ref 5–15)
BUN: 7 mg/dL (ref 6–20)
CO2: 25 mmol/L (ref 22–32)
Calcium: 9.3 mg/dL (ref 8.9–10.3)
Chloride: 105 mmol/L (ref 98–111)
Creatinine, Ser: 0.61 mg/dL (ref 0.44–1.00)
GFR calc Af Amer: >60 mL/min (ref >60)
GFR calc non Af Amer: >60 mL/min (ref >60)
Glucose, Bld: 103 mg/dL — ABNORMAL HIGH (ref 70–99)
Potassium: 3.8 mmol/L (ref 3.5–5.1)
Sodium: 141 mmol/L (ref 135–145)

## 2019-09-26 LAB — CBC
HCT: 38.2 % (ref 36.0–46.0)
Hemoglobin: 13.3 g/dL (ref 12.0–15.0)
MCH: 27.2 pg (ref 26.0–34.0)
MCHC: 34.8 g/dL (ref 30.0–36.0)
MCV: 78.1 fL — ABNORMAL LOW (ref 80.0–100.0)
Platelets: 371 10*3/uL (ref 150–400)
RBC: 4.89 MIL/uL (ref 3.87–5.11)
RDW: 13.9 % (ref 11.5–15.5)
WBC: 6.9 10*3/uL (ref 4.0–10.5)
nRBC: 0 % (ref 0.0–0.2)

## 2019-09-26 LAB — TROPONIN I (HIGH SENSITIVITY): Troponin I (High Sensitivity): <2 ng/L (ref <18)

## 2019-09-26 LAB — I-STAT BETA HCG BLOOD, ED (MC, WL, AP ONLY): I-stat hCG, quantitative: <5 m[IU]/mL (ref <5)

## 2019-09-26 MED ORDER — SODIUM CHLORIDE 0.9% FLUSH: 3.0000 mL | Freq: Once | INTRAVENOUS | Status: DC

## 2019-09-26 NOTE — ED Triage Notes (Signed)
Pt reports migraine x5 days, cp, runny nose, cough and congestion x3 days and chronic Right leg/hip pain, requesting muscle relaxer for that. Reports she has taken all her migraine medication with no relief

## 2019-09-27 ENCOUNTER — Telehealth: Payer: Self-pay | Admitting: Neurology

## 2019-09-27 MED ORDER — NORTRIPTYLINE HCL 10 MG PO CAPS: ORAL_CAPSULE | ORAL | 3 refills | Status: DC

## 2019-09-27 NOTE — Telephone Encounter (Signed)
Pt called back in regards to missed call  

## 2019-09-27 NOTE — Telephone Encounter (Signed)
Patient came in the office today complaining of an ongoing headache. Patient states she has visited the ER and was never able to be seen due to long wait times. She states the medication she takes is not working. I have scheduled an appointment for her next Wednesday. She is requesting a call to see if there is anything else she can try for headache.

## 2019-09-27 NOTE — ED Notes (Signed)
Pt does not respond when called for room

## 2019-09-27 NOTE — Telephone Encounter (Signed)
I called pt and discussed this with her. She is agreeable to this plan. I reminded her of the appt on 10/05/19. She will let me know of any side effects from nortriptyline. Pt verbalized understanding of recommendations and medication instructions.

## 2019-09-27 NOTE — Telephone Encounter (Signed)
I called pt to discuss. No answer, VM not set up. Will try again later. 

## 2019-09-27 NOTE — Telephone Encounter (Signed)
I called pt. She has been taking topamax 100mg  QHS and maxalt PRN for migraine. Neither of which are helping. She has a migraine currently. She went to ER yesterday but waited for 7 hours and had to leave. She has not scheduled her sleep study because she reports that she was not called. She reports that is well hydrated. She reports that she "dreams a lot" when she has a migraine and doesn't sleep well. I encouraged her to call our sleep lab at [phone] to schedule her sleep study. She is requesting another medication to help with her migraine in the meantime.

## 2019-09-27 NOTE — Addendum Note (Signed)
Addended by: Huston Foley on: 09/27/2019 09:52 AM   Modules accepted: Orders

## 2019-09-27 NOTE — Telephone Encounter (Signed)
Please advise patient that she can taper off the Topamax by reducing it to 1 pill each night for 1 week and then stop it.  She can start for preventative/daily use for migraine prevention: Pamelor (generic name: nortriptyline), 10 mg: Take 1 pill daily at bedtime for one week, then 2 pills daily at bedtime thereafter. Common side effects reported are: mouth dryness, drowsiness, confusion, dizziness.  Rx sent to pharmacy. She has FU pending for this month.

## 2019-10-05 ENCOUNTER — Encounter: Payer: Self-pay | Admitting: Neurology

## 2019-10-05 ENCOUNTER — Other Ambulatory Visit: Payer: Self-pay

## 2019-10-05 ENCOUNTER — Ambulatory Visit (INDEPENDENT_AMBULATORY_CARE_PROVIDER_SITE_OTHER): Payer: 59 | Admitting: Neurology

## 2019-10-05 VITALS — BP 140/70 | HR 68 | Temp 96.9°F | Ht 64.0 in | Wt 218.5 lb

## 2019-10-05 DIAGNOSIS — R0683 Snoring: Secondary | ICD-10-CM

## 2019-10-05 DIAGNOSIS — E669 Obesity, unspecified: Secondary | ICD-10-CM

## 2019-10-05 DIAGNOSIS — G4733 Obstructive sleep apnea (adult) (pediatric): Secondary | ICD-10-CM

## 2019-10-05 DIAGNOSIS — G43019 Migraine without aura, intractable, without status migrainosus: Secondary | ICD-10-CM | POA: Diagnosis not present

## 2019-10-05 DIAGNOSIS — G479 Sleep disorder, unspecified: Secondary | ICD-10-CM

## 2019-10-05 DIAGNOSIS — R519 Headache, unspecified: Secondary | ICD-10-CM

## 2019-10-05 MED ORDER — NORTRIPTYLINE HCL 10 MG PO CAPS: 30.0000 mg | ORAL_CAPSULE | Freq: Every day | ORAL | 3 refills | Status: AC

## 2019-10-05 NOTE — Patient Instructions (Addendum)
As discussed, we will do a home sleep test to see if you have obstructive sleep apnea as treating sleep apnea with an AutoPap machine or CPAP machine may help your headaches.  You have increased nortriptyline to 20 mg at bedtime.  After about a week, you can go up to 30 mg at bedtime which is 3 pills of the 10 mg strength.  I adjusted your prescription.  Please try to get about 7 to 8 hours of sleep on any given night possible as sleep deprivation may also cause escalation in your headache.  Please try to hydrate well with water, try to get 6 to 8 cups of water per day on average, 8 ounce each. Please follow-up in about 3 months to see the nurse practitioner.  We will call you in the meantime with your sleep study results. Please try to establish care with a primary care physician.

## 2019-10-05 NOTE — Progress Notes (Signed)
Subjective:    Patient ID: Anna Walls is a 39 y.o. female.  HPI     Interim history:   Anna Walls is a 39 year old right-handed woman with an underlying medical history of sickle cell trait, low back pain, history of preeclampsia, history of pericarditis and pericardial effusion in 2017, and obesity, who Presents for follow-up consultation of her migraine headaches.  The patient is unaccompanied today.  I first met her as a referral from the emergency room on 06/13/2019, at which time she reported an at least 10-year history of migraine headaches.  She was advised to start preventative treatment in the form of generic Topamax and I prescribed Maxalt for as needed use.  She was also advised to proceed with a sleep study to rule out obstructive sleep apnea.  She did not schedule her sleep studies.  She called in the interim earlier this month reporting that her medications were not helping.  She was advised to taper off the Topamax and started titration of nortriptyline. She reported that she had not scheduled her sleep studies because she was not contacted.  The sleep lab had tried to call her in December.  The patient was provided with the number for the sleep lab when she called in March 2021.  Today, 10/05/19: she reports that she is scheduled to pick up her home sleep test equipment today.  She indicates that she does not sleep very well.  She estimates that she gets about 5 or 6 hours of sleep on any given night but sometimes up to 8 hours.  She tries to hydrate well with water.  She had an eye examination within the last 8 months.  She still does not have a primary care physician.  She has been treated with a muscle relaxer for back pain in the recent past.  She has been able to tolerate the nortriptyline but has not noticed much in the way of improvement, she currently takes 20 mg at bedtime, started with the 10 mg strength but increased it about 3 days ago to 20 mg at night.  She has not had  any new symptoms but her headaches are nearly daily.  The patient's allergies, current medications, family history, past medical history, past social history, past surgical history and problem list were reviewed and updated as appropriate.   Previously:   06/13/19: (She) presented to the emergency room on 04/10/2019 with recurrent migrainous headaches.  I reviewed the emergency room records.  She had a head CT without contrast on 04/10/2019 and I reviewed the results: IMPRESSION: Negative non contrasted CT appearance of the brain. She declined IV medications, i.e. headache cocktail at the time.  She was treated symptomatically with p.o. medication including ibuprofen and Tylenol.   She denies being pregnant, she reports having had migrainous headaches for the past 10 years, she does not have a consistent PCP.  She did not think Imitrex helped, she took it a few times, she has tried ondansetron for nausea but does not typically have any vomiting and does not request any nausea medicine at this time.  She reports throbbing headache, sometimes it is bilateral, sometimes it is one-sided, throbbing typically but also more sharp and stabbing at times.  She has associated nausea but rare vomiting.  She does not sleep well.  She snores, at times loudly.  She has woken up with a headache.  She had a sleep study some years ago and was not told she needed CPAP treatment.  She  tries to hydrate well with water, she is not aware of any family history of migraines.  She drinks caffeine and limitation, typically in the form of hot tea with lemon, 1 or 2 cups/day.  She has a migraine headache about 3 times per week on average, in the past, it was more infrequent.  She has associated photophobia and sometimes sonophobia, she patient about 4 months ago and had an updated prescription on her eyeglasses.    Her Past Medical History Is Significant For: Past Medical History:  Diagnosis Date  . Chronic headaches   .  Hyperemesis   . Low back pain   . Pericardial effusion    a. Trace by echo 12/2015.  Marland Kitchen Pericarditis    a. Post-partum 12/2015.  Marland Kitchen Preeclampsia in postpartum period   . Sickle cell trait ([place])     Her Past Surgical History Is Significant For: No past surgical history on file.  Her Family History Is Significant For: Family History  Family history unknown: Yes    Her Social History Is Significant For: Social History   Socioeconomic History  . Marital status: Married    Spouse name: Not on file  . Number of children: Not on file  . Years of education: Not on file  . Highest education level: Not on file  Occupational History  . Not on file  Tobacco Use  . Smoking status: Never Smoker  . Smokeless tobacco: Never Used  Substance and Sexual Activity  . Alcohol use: No  . Drug use: No  . Sexual activity: Not on file  Other Topics Concern  . Not on file  Social History Narrative  . Not on file   Social Determinants of Health   Financial Resource Strain:   . Difficulty of Paying Living Expenses:   Food Insecurity:   . Worried About Charity fundraiser in the Last Year:   . Arboriculturist in the Last Year:   Transportation Needs:   . Film/video editor (Medical):   Marland Kitchen Lack of Transportation (Non-Medical):   Physical Activity:   . Days of Exercise per Week:   . Minutes of Exercise per Session:   Stress:   . Feeling of Stress :   Social Connections:   . Frequency of Communication with Friends and Family:   . Frequency of Social Gatherings with Friends and Family:   . Attends Religious Services:   . Active Member of Clubs or Organizations:   . Attends Archivist Meetings:   Marland Kitchen Marital Status:     Her Allergies Are:  No Known Allergies:   Her Current Medications Are:  Outpatient Encounter Medications as of 10/05/2019  Medication Sig  . acetaminophen (TYLENOL) 325 MG tablet Take 650 mg by mouth every 6 (six) hours as needed.  .  Aspirin-Acetaminophen-Caffeine (MIGRAINE RELIEF PO) Take by mouth.  Marland Kitchen ibuprofen (ADVIL) 800 MG tablet Take by mouth.  . loratadine (CLARITIN) 10 MG tablet Take 10 mg by mouth daily.  . nortriptyline (PAMELOR) 10 MG capsule take 1 pill daily at bedtime for one week, then 2 pills daily at bedtime thereafter.  . [DISCONTINUED] cyclobenzaprine (FLEXERIL) 10 MG tablet Take by mouth.  . [DISCONTINUED] ibuprofen (ADVIL,MOTRIN) 600 MG tablet Take 1 tablet (600 mg total) by mouth every 6 (six) hours. (Patient not taking: Reported on 10/05/2019)  . [DISCONTINUED] omeprazole (PRILOSEC) 20 MG capsule Take by mouth.  . [DISCONTINUED] rizatriptan (MAXALT-MLT) 10 MG disintegrating tablet Take 1 tablet (10 mg  total) by mouth as needed for migraine. May repeat in 2 hours if needed (Patient not taking: Reported on 10/05/2019)  . [DISCONTINUED] rizatriptan (MAXALT-MLT) 10 MG disintegrating tablet Take by mouth.  . [DISCONTINUED] topiramate (TOPAMAX) 50 MG tablet 1/2 pill each bedtime x 1 week, then 1 pill nightly x 1 week, then 1-1/2 pills nightly x 1 week, then 2 pills nightly thereafter. (Patient not taking: Reported on 10/05/2019)  . [DISCONTINUED] topiramate (TOPAMAX) 50 MG tablet TAKE 1 2 (ONE HALF) TABLET BY MOUTH AT BEDTIME FOR 7 DAYS THEN 1 TABLET NIGHTLY FOR 7 DAYS THEN 1 & 1 2 (ONE & ONE HALF) TABLETS NIGHTLY FOR   No facility-administered encounter medications on file as of 10/05/2019.  :  Review of Systems:  Out of a complete 14 point review of systems, all are reviewed and negative with the exception of these symptoms as listed below: Review of Systems  Constitutional: Negative.   HENT: Negative.   Eyes: Negative.   Respiratory: Negative.   Cardiovascular: Negative.   Gastrointestinal: Negative.   Endocrine: Negative.   Genitourinary: Negative.   Musculoskeletal: Negative.   Skin: Negative.   Allergic/Immunologic: Negative.   Neurological: Positive for headaches.       Headache radiates from left  side to right side of head   Hematological: Negative.   Psychiatric/Behavioral: Negative.     Objective:  Neurological Exam  Physical Exam Physical Examination:   Vitals:   10/05/19 0941  BP: 140/70  Pulse: 68  Temp: (!) 96.9 F (36.1 C)   General Examination: The patient is a very pleasant 39 y.o. female in no acute distress. She appears well-developed and well-nourished and well groomed.   HEENT: Normocephalic, atraumatic, pupils are equal, round and reactive to light, corrective eye glasses in place. Funduscopic exam is normal with sharp disc margins noted. Extraocular tracking is good without limitation to gaze excursion or nystagmus noted. Normal smooth pursuit is noted. Hearing is grossly intact. Face is symmetric with normal facial animation and normal facial sensation. Speech is clear with no dysarthria noted. There is no hypophonia. There is no lip, neck/head, jaw or voice tremor. Neck is supple with full range of passive and active motion. There are no carotid bruits on auscultation. Oropharynx exam reveals: mild mouth dryness, adequate dental hygiene and moderate airway crowding.  Tongue protrudes centrally in palate elevates symmetrically.   Chest: Clear to auscultation without wheezing, rhonchi or crackles noted.  Heart: S1+S2+0, regular and normal without murmurs, rubs or gallops noted.   Abdomen: Soft, non-tender and non-distended with normal bowel sounds appreciated on auscultation.  Extremities: There is no pitting edema in the distal lower extremities bilaterally.  Skin: Warm and dry without trophic changes noted.  Musculoskeletal: exam reveals no obvious joint deformities, tenderness or joint swelling or erythema. She reports R sided LBP.  Neurologically:  Mental status: The patient is awake, alert and oriented in all 4 spheres. Her immediate and remote memory, attention, language skills and fund of knowledge are appropriate. There is no evidence of  aphasia, agnosia, apraxia or anomia. Speech is clear with normal prosody and enunciation. Thought process is linear. Mood is normal and affect is normal.  Cranial nerves II - XII are as described above under HEENT exam. In addition: shoulder shrug is normal with equal shoulder height noted. Motor exam: Normal bulk, strength and tone is noted. There is no drift, tremor or rebound. Romberg is negative. Reflexes are 2+ throughout. Babinski: Toes are flexor bilaterally. Fine motor skills  and coordination: grossly intact. Cerebellar testing: No dysmetria or intention tremor on finger to nose testing. Heel to shin is unremarkable bilaterally. There is no truncal or gait ataxia.  Sensory exam: intact to light touch.  Gait, station and balance: She stands easily. No veering to one side is noted. No leaning to one side is noted. Posture is age-appropriate and stance is narrow based. Gait shows normal stride length and normal pace. No problems turning are noted. Tandem walk is slow, but doable.   Assessment and Plan:   In summary, Traci Gafford is a very pleasant 39 year old female with an underlying medical history of sickle cell trait, low back pain, history of preeclampsia, history of pericarditis and pericardial effusion in 2017, and obesity, who Presents for follow-up consultation of her migraine headaches.  She did not notice any benefit from taking Topamax which we started in November.  When she called this month I called in a prescription for nortriptyline.  She is currently taking 20 mg at bedtime, she is tolerating this but has not noticed much in the way of benefit.  She does report that she is not sleeping very well.  She has not yet had her sleep study as recommended.  She is scheduled to pick up her home test equipment today.  She is advised that untreated obstructive sleep apnea may also be a contributor to her escalating headaches.  We talked about headache triggers.  Sleep deprivation may be in  part also a trigger.  She is advised to stay well-hydrated with water, well rested, try to achieve 7 to 8 hours of sleep.  We will call her with her home sleep test results.  She is advised to increase her nortriptyline after about a week to 30 mg each night.  She is advised to follow-up in about 3 months to see the nurse practitioner and we will be in touch by phone call after the home sleep test results are available.  She is encouraged to consider AutoPap or CPAP therapy for sleep apnea if her sleep test results indicate obstructive sleep apnea.  In addition, she is again encouraged to establish care with a primary care physician. I answered all her questions today and she was in agreement. I spent 30 minutes in total face-to-face time and in reviewing records during pre-charting, more than 50% of which was spent in counseling and coordination of care, reviewing test results, reviewing medications and treatment regimen and/or in discussing or reviewing the diagnosis of migraines, sleep disturbance, the prognosis and treatment options. Pertinent laboratory and imaging test results that were available during this visit with the patient were reviewed by me and considered in my medical decision making (see chart for details).

## 2019-10-12 ENCOUNTER — Telehealth: Payer: Self-pay | Admitting: Neurology

## 2019-10-12 NOTE — Telephone Encounter (Signed)
I called pt. I advised pt that Dr. Frances Furbish reviewed their sleep study results and found that pt has sleep apnea. Dr. Frances Furbish recommends that pt starts auto CPAP. I reviewed PAP compliance expectations with the pt. Pt is agreeable to starting a CPAP. I advised pt that an order will be sent to a DME, [facility], and [facility] will call the pt within about one week after they file with the pt's insurance. [facility] will show the pt how to use the machine, fit for masks, and troubleshoot the CPAP if needed. A follow up appt was made for insurance purposes with Shawnie Dapper, NP on May 27,2021 at 9 am. Pt verbalized understanding to arrive 15 minutes early and bring their CPAP. A letter with all of this information in it will be mailed to the pt as a reminder. I verified with the pt that the address we have on file is correct. Pt verbalized understanding of results. Pt had no questions at this time but was encouraged to call back if questions arise. I have sent the order to [facility] and have received confirmation that they have received the order.

## 2019-10-12 NOTE — Telephone Encounter (Signed)
-----   Message from Huston Foley, MD sent at 10/12/2019  7:40 AM EDT ----- Patient was referred by the ER for headaches. Last seen by me on 10/05/19, HST on 10/06/19.    Please call and notify the patient that the recent home sleep test showed obstructive sleep apnea in the severe range. While I recommend treatment for this in the form CPAP, her insurance will not approve a sleep study for this. They will likely only approve a trial of autoPAP, which means, that we don't have to bring her in for a sleep study with CPAP, but will let her try an autoPAP machine at home, through a DME company (of her choice, or as per insurance requirement). The DME representative will educate her on how to use the machine, how to put the mask on, etc. I have placed an order in the chart. Please send referral, talk to patient, send report to referring MD. We will need a FU in sleep clinic for 10 weeks post-PAP set up, please arrange that with me or one of our NPs. Thanks,   Huston Foley, MD, PhD [facility])

## 2019-10-12 NOTE — Addendum Note (Signed)
Addended by: Huston Foley on: 10/12/2019 07:40 AM   Modules accepted: Orders

## 2019-10-12 NOTE — Progress Notes (Signed)
Patient was referred by the ER for headaches. Last seen by me on 10/05/19, HST on 10/06/19.    Please call and notify the patient that the recent home sleep test showed obstructive sleep apnea in the severe range. While I recommend treatment for this in the form CPAP, her insurance will not approve a sleep study for this. They will likely only approve a trial of autoPAP, which means, that we don't have to bring her in for a sleep study with CPAP, but will let her try an autoPAP machine at home, through a DME company (of her choice, or as per insurance requirement). The DME representative will educate her on how to use the machine, how to put the mask on, etc. I have placed an order in the chart. Please send referral, talk to patient, send report to referring MD. We will need a FU in sleep clinic for 10 weeks post-PAP set up, please arrange that with me or one of our NPs. Thanks,   Huston Foley, MD, PhD [facility])

## 2019-10-12 NOTE — Procedures (Signed)
Patient Information     First Name: Anna Last Name: Walls ID: [id number]  Birth Date: [birth year] Age: 39 Gender: Female  Referring Provider: Delia Heady, PA-C BMI: 36.5 (W=214 lb, H=5' 4'')  Neck Circ.:  15 '' Epworth:  5/24   Sleep Study Information    Study Date: 10/06/2019 S/H/A Version: 001.001.001.001 / 4.1.1528 / 64  History:    39 year old with a history of sickle cell trait, low back pain, history of preeclampsia, history of pericarditis and pericardial effusion in 2017, and obesity, who reports recurrent headaches and sleep disruption.  Summary & Diagnosis:    OSA Recommendations:     This home sleep test demonstrates severe obstructive sleep apnea with a total AHI of 36.8/hour and O2 nadir of 84%. Treatment with positive airway pressure (in the form of CPAP) is recommended. This will require a full night CPAP titration study for proper treatment settings, O2 monitoring and mask fitting. Based on the severity of the sleep disordered breathing an attended titration study is indicated. However, patient's insurance has denied an attended sleep study; therefore, the patient will be advised to proceed with an autoPAP titration/trial at home for now. Please note that untreated obstructive sleep apnea may carry additional perioperative morbidity. Patients with significant obstructive sleep apnea should receive perioperative PAP therapy and the surgeons and particularly the anesthesiologist should be informed of the diagnosis and the severity of the sleep disordered breathing. The patient should be cautioned not to drive, work at heights, or operate dangerous or heavy equipment when tired or sleepy. Review and reiteration of good sleep hygiene measures should be pursued with any patient. Other causes of the patient's symptoms, including circadian rhythm disturbances, an underlying mood disorder, medication effect and/or an underlying medical problem cannot be ruled out based on this test. Clinical  correlation is recommended. The patient and his referring provider will be notified of the test results. The patient will be seen in follow up in sleep clinic at [facility].  I certify that I have reviewed the raw data recording prior to the issuance of this report in accordance with the standards of the American Academy of Sleep Medicine (AASM).  Star Age, MD, PhD [facility]) Diplomat, ABPN (Neurology and Sleep)                   Sleep Summary  Oxygen Saturation Statistics   Start Study Time: End Study Time: Total Recording Time:  2:08:37 AM 8:22:37 AM   6 h, 14 min  Total Sleep Time % REM of Sleep Time:  4 h, 58 min  36.9    Mean: 96 Minimum: 84 Maximum: 100  Mean of Desaturations Nadirs (%):   92  Oxygen Desaturation. %:   4-9 10-20 >20 Total  Events Number Total    62  6 91.2 8.8  0 0.0  68 100.0  Oxygen Saturation <90 <=88 <85 <80 <70  Duration (minutes): Sleep % 1.2 0.4  0.4 0.0  0.1 0.0 0.0 0.0 0.0 0.0     Respiratory Indices      Total Events REM NREM All Night  pRDI:  124  pAHI:  122 ODI:  68  pAHIc:  0  % CSR: 0.0 66.1 65.2 45.2 0.0 22.2 21.7 7.4 0.0 37.4 36.8 20.5 0.0       Pulse Rate Statistics during Sleep (BPM)      Mean: 97 Minimum: 58 Maximum: 132    Indices are calculated using technically valid sleep  time of 4 h, 18 min. Central-Indices are calculated using technically valid sleep time of 3 h, 56 min. pRDI/pAHI are calculated using oxi desaturations ? 3%                 Body Position Statistics  Position Supine Prone Right Left Non-Supine  Sleep (min) 239.0 0.0 59.5 0.0 59.5  Sleep % 80.1 0.0 19.9 0.0 19.9  pRDI 36.8 N/A N/A N/A N/A  pAHI 36.5 N/A N/A N/A N/A  ODI 20.7 N/A N/A N/A N/A     Snoring Statistics Snoring Level (dB) >40 >50 >60 >70 >80 >Threshold (45)  Sleep (min) 184.4 7.1 1.1 0.6 0.0 36.2  Sleep % 61.8 2.4 0.4 0.2 0.0 12.1    Mean: 42 dB Sleep Stages  Chart                                                        pAHI=36.8                                                                    Mild              Moderate                    Severe                                                 5              15                    30

## 2019-12-08 ENCOUNTER — Ambulatory Visit: Payer: Self-pay | Admitting: Family Medicine

## 2020-01-25 ENCOUNTER — Ambulatory Visit: Payer: Self-pay | Attending: Internal Medicine

## 2020-03-29 ENCOUNTER — Encounter (HOSPITAL_COMMUNITY): Payer: Self-pay | Admitting: Emergency Medicine

## 2020-03-29 ENCOUNTER — Ambulatory Visit (HOSPITAL_COMMUNITY)
Admission: EM | Admit: 2020-03-29 | Discharge: 2020-03-29 | Disposition: A | Discharge status: Home / Self Care | Payer: 59 | Attending: Family Medicine | Admitting: Family Medicine

## 2020-03-29 ENCOUNTER — Ambulatory Visit (INDEPENDENT_AMBULATORY_CARE_PROVIDER_SITE_OTHER): Payer: 59

## 2020-03-29 ENCOUNTER — Other Ambulatory Visit: Payer: Self-pay

## 2020-03-29 DIAGNOSIS — K146 Glossodynia: Secondary | ICD-10-CM

## 2020-03-29 DIAGNOSIS — R5383 Other fatigue: Secondary | ICD-10-CM

## 2020-03-29 DIAGNOSIS — M25562 Pain in left knee: Secondary | ICD-10-CM

## 2020-03-29 MED ORDER — ONDANSETRON HCL 4 MG/2ML IJ SOLN
INTRAMUSCULAR | Status: AC
  Filled 2020-03-29: qty 2

## 2020-03-29 MED ORDER — ONDANSETRON HCL 4 MG/2ML IJ SOLN
4.0000 mg | Freq: Once | INTRAMUSCULAR | Status: AC
  Administered 2020-03-29: 4 mg via INTRAMUSCULAR

## 2020-03-29 MED ORDER — ONDANSETRON 4 MG PO TBDP: 4.0000 mg | ORAL_TABLET | Freq: Three times a day (TID) | ORAL | 0 refills | Status: DC | PRN

## 2020-03-29 MED ORDER — NYSTATIN 100000 UNIT/ML MT SUSP: 500000.0000 [IU] | Freq: Four times a day (QID) | OROMUCOSAL | 0 refills | Status: DC

## 2020-03-29 MED ORDER — PREDNISONE 20 MG PO TABS: 40.0000 mg | ORAL_TABLET | Freq: Every day | ORAL | 0 refills | Status: DC

## 2020-03-29 NOTE — ED Provider Notes (Signed)
[facility]    CSN: [id number] Arrival date & time: 03/29/20  1607      History   Chief Complaint Chief Complaint  Patient presents with  . Fall  . Knee Pain  . URI    HPI Anna Walls is a 39 y.o. female.   Here today for 2 concerns.   Left medial knee pain since a fall she took in the airport about a week ago. States her knee hit the ground directly to the area of pain. Has had swelling, bruising to the area since and cannot straighten or bend the knee fully. Has been using ice, topical rubs, and ibuprofen and tylenol without much relief. No known hx of knee issues.   Mouth soreness, fatigue, body aches, sore throat, rhinorrhea for about a week now since traveling back from [place]. Took some malaria medicine in [place] 2 weeks ago, prior to onset of these sxs and states she has another pack left at home that she has not yet taken. Was tested for COVID 1 week ago and this was neg. She states she just had COVID in Dec and this does not feel consistent with that. Denies fever, N/V/D, abdominal pain, rashes.      Past Medical History:  Diagnosis Date  . Chronic headaches   . Hyperemesis   . Low back pain   . Pericardial effusion    a. Trace by echo 12/2015.  Marland Kitchen Pericarditis    a. Post-partum 12/2015.  Marland Kitchen Preeclampsia in postpartum period   . Sickle cell trait [facility])     Patient Active Problem List   Diagnosis Date Noted  . Chronic nonintractable headache 05/06/2017  . Tympanic membrane rupture, right 05/06/2017  . Mastitis, right, acute 01/08/2016  . Postoperative pulmonary edema (HCC) 01/07/2016  . Chest pain at rest   . Shortness of breath   . Acute pericarditis   . Preeclampsia in postpartum period 01/06/2016  . Supervision of high risk pregnancy, antepartum 06/25/2015    History reviewed. No pertinent surgical history.  OB History    Gravida  5   Para  5   Term  5   Preterm      AB      Living  4     SAB      TAB      Ectopic        Multiple  0   Live Births  4            Home Medications    Prior to Admission medications   Medication Sig Start Date End Date Taking? Authorizing Provider  acetaminophen (TYLENOL) 325 MG tablet Take 650 mg by mouth every 6 (six) hours as needed.    [provider]  Aspirin-Acetaminophen-Caffeine (MIGRAINE RELIEF PO) Take by mouth.    [provider]  ibuprofen (ADVIL) 800 MG tablet Take by mouth. 06/28/19 06/28/20  [provider]  loratadine (CLARITIN) 10 MG tablet Take 10 mg by mouth daily. 06/28/19   [provider]  nortriptyline (PAMELOR) 10 MG capsule Take 3 capsules (30 mg total) by mouth at bedtime. 10/05/19   Huston Foley, MD  nystatin (MYCOSTATIN) 100000 UNIT/ML suspension Take 5 mLs (500,000 Units total) by mouth 4 (four) times daily. 03/29/20   Particia Nearing, PA-C  ondansetron (ZOFRAN ODT) 4 MG disintegrating tablet Take 1 tablet (4 mg total) by mouth every 8 (eight) hours as needed for nausea or vomiting. 03/29/20   Particia Nearing, PA-C  predniSONE (DELTASONE) 20 MG tablet Take 2 tablets (40 mg total) by mouth daily with breakfast. 03/29/20   Particia Nearing, PA-C    Family History Family History  Family history unknown: Yes    Social History Social History   Tobacco Use  . Smoking status: Never Smoker  . Smokeless tobacco: Never Used  Vaping Use  . Vaping Use: Never used  Substance Use Topics  . Alcohol use: No  . Drug use: No     Allergies   Patient has no known allergies.   Review of Systems Review of Systems PER HPI    Physical Exam Triage Vital Signs ED Triage Vitals  Enc Vitals Group     BP 03/29/20 1803 (!) 135/103     Pulse Rate 03/29/20 1803 98     Resp 03/29/20 1803 16     Temp 03/29/20 1803 98.4 F (36.9 C)     Temp Source 03/29/20 1803 Oral     SpO2 03/29/20 1803 96 %     Weight --      Height --      Head Circumference --      Peak Flow --      Pain Score 03/29/20  1800 10     Pain Loc --      Pain Edu? --      Excl. in GC? --    No data found.  Updated Vital Signs BP (!) 135/103 (BP Location: Left Arm)   Pulse 98   Temp 98.4 F (36.9 C) (Oral)   Resp 16   LMP 03/26/2020   SpO2 96%   Visual Acuity Right Eye Distance:   Left Eye Distance:   Bilateral Distance:    Right Eye Near:   Left Eye Near:    Bilateral Near:     Physical Exam Vitals and nursing note reviewed.  Constitutional:      Appearance: Normal appearance. She is not ill-appearing.  HENT:     Head: Atraumatic.     Right Ear: Tympanic membrane normal.     Left Ear: Tympanic membrane normal.     Nose: Nose normal. No congestion.     Mouth/Throat:     Mouth: Mucous membranes are moist.     Pharynx: Posterior oropharyngeal erythema (orohparynx erythematous) present.     Comments: Tongue erythematous with white coating  Eyes:     Extraocular Movements: Extraocular movements intact.     Conjunctiva/sclera: Conjunctivae normal.  Cardiovascular:     Rate and Rhythm: Normal rate and regular rhythm.     Heart sounds: Normal heart sounds.  Pulmonary:     Effort: Pulmonary effort is normal.     Breath sounds: Normal breath sounds. No wheezing or rales.  Abdominal:     General: Bowel sounds are normal. There is no distension.     Palpations: Abdomen is soft.     Tenderness: There is no abdominal tenderness. There is no guarding.  Musculoskeletal:        General: Swelling, tenderness (ttp left medial knee) and signs of injury present. Normal range of motion.     Cervical back: Normal range of motion and neck supple.     Comments: No obvious deformity to left knee on palpation ROM exam limited by patient's pain, but no joint laxity appreciable  Skin:    General: Skin is warm and dry.     Findings: Bruising present. No erythema.  Neurological:     Mental Status: She is alert  and oriented to person, place, and time.     Comments: B/l lower extremities neurovascularly  intact  Psychiatric:        Mood and Affect: Mood normal.        Thought Content: Thought content normal.        Judgment: Judgment normal.      UC Treatments / Results  Labs (all labs ordered are listed, but only abnormal results are displayed) Labs Reviewed - No data to display  EKG   Radiology DG Knee Complete 4 Views Left  Result Date: 03/29/2020 CLINICAL DATA:  Left knee pain after fall EXAM: LEFT KNEE - COMPLETE 4+ VIEW COMPARISON:  None. FINDINGS: No evidence of fracture, dislocation, or joint effusion. No evidence of arthropathy or other focal bone abnormality. Soft tissues are unremarkable. IMPRESSION: Negative. Electronically Signed   By: Jasmine Pang M.D.   On: 03/29/2020 19:14    Procedures Procedures (including critical care time)  Medications Ordered in UC Medications  ondansetron (ZOFRAN) injection 4 mg (4 mg Intramuscular Given 03/29/20 1907)    Initial Impression / Assessment and Plan / UC Course  I have reviewed the triage vital signs and the nursing notes.  Pertinent labs & imaging results that were available during my care of the patient were reviewed by me and considered in my medical decision making (see chart for details).     Left knee pain - x-ray negative for fx or displacement, discussed prednisone burst, continued OTC pain relievers, compression sleeve rest, warm epsom salt soaks. F/u if worsening or not improving over next few weeks.   Will send nystatin mouthwash in case her oral pain and white coating on tongue is related to a thrush infection, and she plans to take her extra pack of anti-malaria pills she has at home in case this is causing her generalized sxs. She declines COVID testing today. Discussed quarantine until feeling better, OTC medications and supportive care. REturn if sxs worsening.   Final Clinical Impressions(s) / UC Diagnoses   Final diagnoses:  Acute pain of left knee  Fatigue, unspecified type  Tongue pain    Discharge Instructions   None    ED Prescriptions    Medication Sig Dispense Auth. Provider   ondansetron (ZOFRAN ODT) 4 MG disintegrating tablet Take 1 tablet (4 mg total) by mouth every 8 (eight) hours as needed for nausea or vomiting. 20 tablet Particia Nearing, [place]   predniSONE (DELTASONE) 20 MG tablet Take 2 tablets (40 mg total) by mouth daily with breakfast. 10 tablet Particia Nearing, PA-C   nystatin (MYCOSTATIN) 100000 UNIT/ML suspension Take 5 mLs (500,000 Units total) by mouth 4 (four) times daily. 60 mL Particia Nearing, [place]     PDMP not reviewed this encounter.   Particia Nearing, [place] 03/29/20 2140

## 2020-03-29 NOTE — ED Triage Notes (Signed)
Pt reports she just returned from [place] last Friday. She fell in the airport on the stairs tripping over her luggage. Pt also c/o mouth feeling sore and lack of appetite since returning. She states she took antivirals for malaria while traveling and she took a covid test before returning which was negative.

## 2020-07-17 ENCOUNTER — Ambulatory Visit (HOSPITAL_COMMUNITY)
Admission: EM | Admit: 2020-07-17 | Discharge: 2020-07-17 | Disposition: A | Discharge status: Home / Self Care | Payer: 59 | Attending: Student | Admitting: Student

## 2020-07-17 ENCOUNTER — Encounter (HOSPITAL_COMMUNITY): Payer: Self-pay | Admitting: Emergency Medicine

## 2020-07-17 ENCOUNTER — Other Ambulatory Visit: Payer: Self-pay

## 2020-07-17 DIAGNOSIS — J209 Acute bronchitis, unspecified: Secondary | ICD-10-CM | POA: Diagnosis not present

## 2020-07-17 DIAGNOSIS — Z7982 Long term (current) use of aspirin: Secondary | ICD-10-CM | POA: Insufficient documentation

## 2020-07-17 DIAGNOSIS — Z79899 Other long term (current) drug therapy: Secondary | ICD-10-CM | POA: Insufficient documentation

## 2020-07-17 DIAGNOSIS — H903 Sensorineural hearing loss, bilateral: Secondary | ICD-10-CM | POA: Diagnosis present

## 2020-07-17 DIAGNOSIS — H66005 Acute suppurative otitis media without spontaneous rupture of ear drum, recurrent, left ear: Secondary | ICD-10-CM | POA: Diagnosis not present

## 2020-07-17 DIAGNOSIS — J069 Acute upper respiratory infection, unspecified: Secondary | ICD-10-CM | POA: Diagnosis not present

## 2020-07-17 DIAGNOSIS — U071 COVID-19: Secondary | ICD-10-CM | POA: Diagnosis not present

## 2020-07-17 LAB — RESP PANEL BY RT-PCR (FLU A&B, COVID) ARPGX2
Influenza A by PCR: NEGATIVE
Influenza B by PCR: NEGATIVE
SARS Coronavirus 2 by RT PCR: POSITIVE — AB

## 2020-07-17 MED ORDER — AMOXICILLIN-POT CLAVULANATE 875-125 MG PO TABS: 1.0000 | ORAL_TABLET | Freq: Two times a day (BID) | ORAL | 0 refills | Status: DC

## 2020-07-17 MED ORDER — PREDNISONE 10 MG (21) PO TBPK: ORAL_TABLET | Freq: Every day | ORAL | 0 refills | Status: DC

## 2020-07-17 NOTE — ED Triage Notes (Signed)
Pt c/o cold sx onset 4 days.... reports back pain started first and then progressed to cold sx   Sx today include: Sore throat, chest congestion, ear/back pain  A&O x4... NAD.Marland Kitchen. ambulatory

## 2020-07-17 NOTE — Discharge Instructions (Addendum)
-  Prednisone taper as directed -Augmentin twice daily x7 days  We are currently awaiting result of your PCR covid-19 test. This typically comes back in 1-2 days. We'll call you if the result is positive. Otherwise, the result will be sent electronically to your MyChart. You can also call this clinic and ask for your result via telephone.   Please isolate at home while awaiting these results. If your test is positive for Covid-19, continue to isolate at home for 5 days if you have mild symptoms, or a total of 10 days from symptom onset if you have more severe symptoms. If you quarantine for a shorter period of time (i.e. 5 days), make sure to wear a mask until day 10 of symptoms. Treat your symptoms at home with OTC remedies like tylenol/ibuprofen, mucinex, nyquil, etc. Seek medical attention if you develop high fevers, chest pain, shortness of breath, ear pain, facial pain, etc. Make sure to get up and move around every 2-3 hours while convalescing to help prevent blood clots. Drink plenty of fluids, and rest as much as possible.

## 2020-07-17 NOTE — ED Provider Notes (Signed)
[facility]    CSN: [id number] Arrival date & time: 07/17/20  1813      History   Chief Complaint Chief Complaint  Patient presents with  . URI    HPI Anna Walls is a 40 y.o. female Presenting for URI symptoms for 4 days. History of headaches, low back pain, hyperemesis. Presenting today with back pain/body aches and sore throat, congestion, ear pain.  Denies fevers/chills, n/v/d, shortness of breath, chest pain,  facial pain, teeth pain, headaches,loss of taste/smell, swollen lymph nodes, ear pain.  Denies chest pain, shortness of breath, confusion, high fevers.  Fully vaccinated for covid-19.   HPI  Past Medical History:  Diagnosis Date  . Chronic headaches   . Hyperemesis   . Low back pain   . Pericardial effusion    a. Trace by echo 12/2015.  Marland Kitchen Pericarditis    a. Post-partum 12/2015.  Marland Kitchen Preeclampsia in postpartum period   . Sickle cell trait [facility])     Patient Active Problem List   Diagnosis Date Noted  . Chronic nonintractable headache 05/06/2017  . Tympanic membrane rupture, right 05/06/2017  . Mastitis, right, acute 01/08/2016  . Postoperative pulmonary edema (HCC) 01/07/2016  . Chest pain at rest   . Shortness of breath   . Acute pericarditis   . Preeclampsia in postpartum period 01/06/2016  . Supervision of high risk pregnancy, antepartum 06/25/2015    History reviewed. No pertinent surgical history.  OB History    Gravida  5   Para  5   Term  5   Preterm      AB      Living  4     SAB      IAB      Ectopic      Multiple  0   Live Births  4            Home Medications    Prior to Admission medications   Medication Sig Start Date End Date Taking? Authorizing Provider  amoxicillin-clavulanate (AUGMENTIN) 875-125 MG tablet Take 1 tablet by mouth every 12 (twelve) hours. 07/17/20  Yes Rhys Martini, PA-C  predniSONE (STERAPRED UNI-PAK 21 TAB) 10 MG (21) TBPK tablet Take by mouth daily. Take 6 tabs by mouth daily   for 2 days, then 5 tabs for 2 days, then 4 tabs for 2 days, then 3 tabs for 2 days, 2 tabs for 2 days, then 1 tab by mouth daily for 2 days 07/17/20  Yes Cheree Ditto, Lyman Speller, PA-C  acetaminophen (TYLENOL) 325 MG tablet Take 650 mg by mouth every 6 (six) hours as needed.    [provider]  Aspirin-Acetaminophen-Caffeine (MIGRAINE RELIEF PO) Take by mouth.    [provider]  loratadine (CLARITIN) 10 MG tablet Take 10 mg by mouth daily. 06/28/19   [provider]  nortriptyline (PAMELOR) 10 MG capsule Take 3 capsules (30 mg total) by mouth at bedtime. 10/05/19   Huston Foley, MD  nystatin (MYCOSTATIN) 100000 UNIT/ML suspension Take 5 mLs (500,000 Units total) by mouth 4 (four) times daily. 03/29/20   Particia Nearing, PA-C  ondansetron (ZOFRAN ODT) 4 MG disintegrating tablet Take 1 tablet (4 mg total) by mouth every 8 (eight) hours as needed for nausea or vomiting. 03/29/20   Particia Nearing, PA-C    Family History Family History  Family history unknown: Yes    Social History Social History   Tobacco Use  . Smoking status: Never Smoker  .  Smokeless tobacco: Never Used  Vaping Use  . Vaping Use: Never used  Substance Use Topics  . Alcohol use: No  . Drug use: No     Allergies   Patient has no known allergies.   Review of Systems Review of Systems  Constitutional: Negative for appetite change, chills and fever.  HENT: Positive for congestion and sore throat. Negative for ear pain, rhinorrhea, sinus pressure and sinus pain.   Eyes: Negative for redness and visual disturbance.  Respiratory: Negative for cough, chest tightness, shortness of breath and wheezing.   Cardiovascular: Negative for chest pain and palpitations.  Gastrointestinal: Negative for abdominal pain, constipation, diarrhea, nausea and vomiting.  Genitourinary: Negative for dysuria, frequency and urgency.  Musculoskeletal: Positive for myalgias.  Neurological: Negative for dizziness,  weakness and headaches.  Psychiatric/Behavioral: Negative for confusion.  All other systems reviewed and are negative.    Physical Exam Triage Vital Signs ED Triage Vitals  Enc Vitals Group     BP 07/17/20 1955 (!) 132/99     Pulse Rate 07/17/20 1955 79     Resp 07/17/20 1955 14     Temp 07/17/20 1955 98.5 F (36.9 C)     Temp Source 07/17/20 1955 Oral     SpO2 07/17/20 1955 97 %     Weight --      Height --      Head Circumference --      Peak Flow --      Pain Score 07/17/20 1953 9     Pain Loc --      Pain Edu? --      Excl. in GC? --    No data found.  Updated Vital Signs BP (!) 132/99 (BP Location: Left Arm)   Pulse 79   Temp 98.5 F (36.9 C) (Oral)   Resp 14   LMP 07/12/2020   SpO2 97%   Breastfeeding No   Visual Acuity Right Eye Distance:   Left Eye Distance:   Bilateral Distance:    Right Eye Near:   Left Eye Near:    Bilateral Near:     Physical Exam Vitals reviewed.  Constitutional:      General: She is not in acute distress.    Appearance: Normal appearance. She is not ill-appearing.  HENT:     Head: Normocephalic and atraumatic.     Right Ear: Hearing, tympanic membrane, ear canal and external ear normal. No swelling or tenderness. There is no impacted cerumen. No mastoid tenderness. Tympanic membrane is not perforated, erythematous, retracted or bulging.     Left Ear: Hearing, ear canal and external ear normal. No swelling or tenderness. There is no impacted cerumen. No mastoid tenderness. Tympanic membrane is erythematous and bulging. Tympanic membrane is not perforated or retracted.     Nose:     Right Sinus: No maxillary sinus tenderness or frontal sinus tenderness.     Left Sinus: No maxillary sinus tenderness or frontal sinus tenderness.     Mouth/Throat:     Mouth: Mucous membranes are moist.     Pharynx: Uvula midline. Posterior oropharyngeal erythema present. No oropharyngeal exudate.     Tonsils: No tonsillar exudate.   Cardiovascular:     Rate and Rhythm: Normal rate and regular rhythm.     Heart sounds: Normal heart sounds.  Pulmonary:     Breath sounds: Normal air entry. Rhonchi present. No wheezing or rales.     Comments: Scattered rhonchi throughout occ cough Chest:  Chest wall: No tenderness.     Comments: Tenderness to palpation along sternal border bilaterally  Abdominal:     General: Abdomen is flat. Bowel sounds are normal.     Tenderness: There is no abdominal tenderness. There is no guarding or rebound.  Lymphadenopathy:     Cervical: No cervical adenopathy.  Neurological:     General: No focal deficit present.     Mental Status: She is alert and oriented to person, place, and time.  Psychiatric:        Attention and Perception: Attention and perception normal.        Mood and Affect: Mood and affect normal.        Behavior: Behavior normal. Behavior is cooperative.        Thought Content: Thought content normal.        Judgment: Judgment normal.      UC Treatments / Results  Labs (all labs ordered are listed, but only abnormal results are displayed) Labs Reviewed  RESP PANEL BY RT-PCR (FLU A&B, COVID) [id number]    EKG   Radiology No results found.  Procedures Procedures (including critical care time)  Medications Ordered in UC Medications - No data to display  Initial Impression / Assessment and Plan / UC Course  I have reviewed the triage vital signs and the nursing notes.  Pertinent labs & imaging results that were available during my care of the patient were reviewed by me and considered in my medical decision making (see chart for details).     Afebrile nontachycardic nontachypneic, oxygenating well on room air.   Covid and influenza tests sent today. Patient is fully vaccinated for covid-19. Isolation precautions per CDC guidelines until negative result. Symptomatic relief with OTC Mucinex, Nyquil, etc. Return precautions- new/worsening fevers/chills,  shortness of breath, chest pain, abd pain, etc.   -Prednisone taper as directed, she is not a diabetic, sugars wnl 09/2019 -Augmentin twice daily x7 days   Final Clinical Impressions(s) / UC Diagnoses   Final diagnoses:  Acute bronchitis, unspecified organism  Acute upper respiratory infection  Recurrent acute suppurative otitis media without spontaneous rupture of left tympanic membrane     Discharge Instructions     -Prednisone taper as directed -Augmentin twice daily x7 days  We are currently awaiting result of your PCR covid-19 test. This typically comes back in 1-2 days. We'll call you if the result is positive. Otherwise, the result will be sent electronically to your MyChart. You can also call this clinic and ask for your result via telephone.   Please isolate at home while awaiting these results. If your test is positive for Covid-19, continue to isolate at home for 5 days if you have mild symptoms, or a total of 10 days from symptom onset if you have more severe symptoms. If you quarantine for a shorter period of time (i.e. 5 days), make sure to wear a mask until day 10 of symptoms. Treat your symptoms at home with OTC remedies like tylenol/ibuprofen, mucinex, nyquil, etc. Seek medical attention if you develop high fevers, chest pain, shortness of breath, ear pain, facial pain, etc. Make sure to get up and move around every 2-3 hours while convalescing to help prevent blood clots. Drink plenty of fluids, and rest as much as possible.     ED Prescriptions    Medication Sig Dispense Auth. Provider   predniSONE (STERAPRED UNI-PAK 21 TAB) 10 MG (21) TBPK tablet Take by mouth daily. Take 6 tabs by mouth daily  for 2 days, then 5 tabs for 2 days, then 4 tabs for 2 days, then 3 tabs for 2 days, 2 tabs for 2 days, then 1 tab by mouth daily for 2 days 42 tablet Hazel Sams, PA-C   amoxicillin-clavulanate (AUGMENTIN) 875-125 MG tablet Take 1 tablet by mouth every 12 (twelve) hours. 14  tablet Hazel Sams, PA-C     PDMP not reviewed this encounter.   Hazel Sams, PA-C 07/17/20 2056

## 2020-07-31 ENCOUNTER — Ambulatory Visit (INDEPENDENT_AMBULATORY_CARE_PROVIDER_SITE_OTHER): Payer: 59

## 2020-07-31 ENCOUNTER — Encounter (HOSPITAL_COMMUNITY): Payer: Self-pay | Admitting: Emergency Medicine

## 2020-07-31 ENCOUNTER — Ambulatory Visit (HOSPITAL_COMMUNITY)
Admission: EM | Admit: 2020-07-31 | Discharge: 2020-07-31 | Disposition: A | Discharge status: Home / Self Care | Payer: 59 | Attending: Family Medicine | Admitting: Family Medicine

## 2020-07-31 DIAGNOSIS — Z8616 Personal history of COVID-19: Secondary | ICD-10-CM

## 2020-07-31 DIAGNOSIS — R0602 Shortness of breath: Secondary | ICD-10-CM

## 2020-07-31 DIAGNOSIS — R059 Cough, unspecified: Secondary | ICD-10-CM

## 2020-07-31 DIAGNOSIS — J40 Bronchitis, not specified as acute or chronic: Secondary | ICD-10-CM | POA: Diagnosis not present

## 2020-07-31 MED ORDER — PREDNISONE 50 MG PO TABS: ORAL_TABLET | ORAL | 0 refills | Status: DC

## 2020-07-31 MED ORDER — PROMETHAZINE-DM 6.25-15 MG/5ML PO SYRP: 5.0000 mL | ORAL_SOLUTION | Freq: Four times a day (QID) | ORAL | 0 refills | Status: DC | PRN

## 2020-07-31 NOTE — ED Provider Notes (Signed)
[facility]    CSN: [id number] Arrival date & time: 07/31/20  1059      History   Chief Complaint Chief Complaint  Patient presents with  . URI    HPI Anna Walls is a 40 y.o. female.   Here today with persistent cough, SOB, chest tightness. Diagnosed with COVID 2.5 weeks ago and was treated with augmentin and prednisone which helped but the cough persists. Denies fever, chills, abdominal pain, N/V/D. Taking claritin daily at this point but otherwise no OTC remedies tried recently. No new sick contacts. Hx of allergic rhinitis and she thinks asthma though never formally diagnosed.      Past Medical History:  Diagnosis Date  . Chronic headaches   . Hyperemesis   . Low back pain   . Pericardial effusion    a. Trace by echo 12/2015.  Marland Kitchen Pericarditis    a. Post-partum 12/2015.  Marland Kitchen Preeclampsia in postpartum period   . Sickle cell trait [facility])     Patient Active Problem List   Diagnosis Date Noted  . Chronic nonintractable headache 05/06/2017  . Tympanic membrane rupture, right 05/06/2017  . Mastitis, right, acute 01/08/2016  . Postoperative pulmonary edema (HCC) 01/07/2016  . Chest pain at rest   . Shortness of breath   . Acute pericarditis   . Preeclampsia in postpartum period 01/06/2016  . Supervision of high risk pregnancy, antepartum 06/25/2015    History reviewed. No pertinent surgical history.  OB History    Gravida  5   Para  5   Term  5   Preterm      AB      Living  4     SAB      IAB      Ectopic      Multiple  0   Live Births  4            Home Medications    Prior to Admission medications   Medication Sig Start Date End Date Taking? Authorizing Provider  predniSONE (DELTASONE) 50 MG tablet Take 1 tab daily with breakfast for 5 days 07/31/20  Yes Particia Nearing, PA-C  promethazine-dextromethorphan (PROMETHAZINE-DM) 6.25-15 MG/5ML syrup Take 5 mLs by mouth 4 (four) times daily as needed for cough. 07/31/20   Yes Particia Nearing, PA-C  acetaminophen (TYLENOL) 325 MG tablet Take 650 mg by mouth every 6 (six) hours as needed.    [provider]  Aspirin-Acetaminophen-Caffeine (MIGRAINE RELIEF PO) Take by mouth.    [provider]  loratadine (CLARITIN) 10 MG tablet Take 10 mg by mouth daily. 06/28/19   [provider]  nortriptyline (PAMELOR) 10 MG capsule Take 3 capsules (30 mg total) by mouth at bedtime. 10/05/19   Huston Foley, MD  nystatin (MYCOSTATIN) 100000 UNIT/ML suspension Take 5 mLs (500,000 Units total) by mouth 4 (four) times daily. 03/29/20   Particia Nearing, PA-C  ondansetron (ZOFRAN ODT) 4 MG disintegrating tablet Take 1 tablet (4 mg total) by mouth every 8 (eight) hours as needed for nausea or vomiting. 03/29/20   Particia Nearing, PA-C    Family History Family History  Family history unknown: Yes    Social History Social History   Tobacco Use  . Smoking status: Never Smoker  . Smokeless tobacco: Never Used  Vaping Use  . Vaping Use: Never used  Substance Use Topics  . Alcohol use: No  . Drug use: No     Allergies   Patient has  no known allergies.   Review of Systems Review of Systems PER HPI    Physical Exam Triage Vital Signs ED Triage Vitals  Enc Vitals Group     BP 07/31/20 1132 130/88     Pulse Rate 07/31/20 1132 95     Resp 07/31/20 1132 18     Temp 07/31/20 1132 97.7 F (36.5 C)     Temp Source 07/31/20 1132 Oral     SpO2 07/31/20 1132 99 %     Weight --      Height --      Head Circumference --      Peak Flow --      Pain Score 07/31/20 1133 7     Pain Loc --      Pain Edu? --      Excl. in GC? --    No data found.  Updated Vital Signs BP 130/88 (BP Location: Right Arm)   Pulse 95   Temp 97.7 F (36.5 C) (Oral)   Resp 18   LMP 07/31/2020   SpO2 99%   Visual Acuity Right Eye Distance:   Left Eye Distance:   Bilateral Distance:    Right Eye Near:   Left Eye Near:    Bilateral Near:      Physical Exam Vitals and nursing note reviewed.  Constitutional:      Appearance: Normal appearance. She is not ill-appearing.  HENT:     Head: Atraumatic.     Right Ear: Tympanic membrane normal.     Left Ear: Tympanic membrane normal.     Nose: Nose normal.     Mouth/Throat:     Mouth: Mucous membranes are moist.     Pharynx: Posterior oropharyngeal erythema present.  Eyes:     Extraocular Movements: Extraocular movements intact.     Conjunctiva/sclera: Conjunctivae normal.  Cardiovascular:     Rate and Rhythm: Normal rate and regular rhythm.     Heart sounds: Normal heart sounds.  Pulmonary:     Effort: Pulmonary effort is normal. No respiratory distress.     Breath sounds: Normal breath sounds. No wheezing or rales.  Abdominal:     General: Bowel sounds are normal. There is no distension.     Palpations: Abdomen is soft.     Tenderness: There is no abdominal tenderness. There is no guarding.  Musculoskeletal:        General: Normal range of motion.     Cervical back: Normal range of motion and neck supple.  Skin:    General: Skin is warm and dry.  Neurological:     Mental Status: She is alert and oriented to person, place, and time.  Psychiatric:        Mood and Affect: Mood normal.        Thought Content: Thought content normal.        Judgment: Judgment normal.      UC Treatments / Results  Labs (all labs ordered are listed, but only abnormal results are displayed) Labs Reviewed - No data to display  EKG   Radiology DG Chest 2 View  Result Date: 07/31/2020 CLINICAL DATA:  40 year old female with history of persistent cough and shortness of breath. History of COVID infection. EXAM: CHEST - 2 VIEW COMPARISON:  Chest x-ray 09/26/2019. FINDINGS: Lung volumes are normal. No consolidative airspace disease. No pleural effusions. No pneumothorax. No pulmonary nodule or mass noted. Pulmonary vasculature and the cardiomediastinal silhouette are within normal limits.  IMPRESSION: No radiographic  evidence of acute cardiopulmonary disease. Electronically Signed   By: Trudie Reed M.D.   On: 07/31/2020 12:38    Procedures Procedures (including critical care time)  Medications Ordered in UC Medications - No data to display  Initial Impression / Assessment and Plan / UC Course  I have reviewed the triage vital signs and the nursing notes.  Pertinent labs & imaging results that were available during my care of the patient were reviewed by me and considered in my medical decision making (see chart for details).     Vitals and exam reassuring, CXR without acute abnormalities. Will treat with prednisone and phenergan DM, discussed continued allergy regimen, supportive home care. Return for acutely worsening sxs. Note to release back to work given per her request.   Final Clinical Impressions(s) / UC Diagnoses   Final diagnoses:  Bronchitis   Discharge Instructions   None    ED Prescriptions    Medication Sig Dispense Auth. Provider   predniSONE (DELTASONE) 50 MG tablet Take 1 tab daily with breakfast for 5 days 5 tablet Particia Nearing, PA-C   promethazine-dextromethorphan (PROMETHAZINE-DM) 6.25-15 MG/5ML syrup Take 5 mLs by mouth 4 (four) times daily as needed for cough. 100 mL Particia Nearing, [place]     PDMP not reviewed this encounter.   Particia Nearing, [place] 07/31/20 1414

## 2020-07-31 NOTE — ED Triage Notes (Signed)
Pt here for persistent cough ... seen here on 1/4 and tested positive for COVID  Sx today include: chest congestion, prod cough and chest pain d/t cough  A&O x4... NAD.Anna Walls. ambulatory

## 2021-05-10 ENCOUNTER — Ambulatory Visit (HOSPITAL_COMMUNITY)
Admission: EM | Admit: 2021-05-10 | Discharge: 2021-05-10 | Disposition: A | Discharge status: Home / Self Care | Payer: 59 | Attending: Emergency Medicine | Admitting: Emergency Medicine

## 2021-05-10 ENCOUNTER — Other Ambulatory Visit: Payer: Self-pay

## 2021-05-10 ENCOUNTER — Encounter (HOSPITAL_COMMUNITY): Payer: Self-pay

## 2021-05-10 DIAGNOSIS — Z20822 Contact with and (suspected) exposure to covid-19: Secondary | ICD-10-CM | POA: Diagnosis not present

## 2021-05-10 DIAGNOSIS — J111 Influenza due to unidentified influenza virus with other respiratory manifestations: Secondary | ICD-10-CM | POA: Insufficient documentation

## 2021-05-10 LAB — CBC
HCT: 38.5 % (ref 36.0–46.0)
Hemoglobin: 14.1 g/dL (ref 12.0–15.0)
MCH: 27.6 pg (ref 26.0–34.0)
MCHC: 36.6 g/dL — ABNORMAL HIGH (ref 30.0–36.0)
MCV: 75.3 fL — ABNORMAL LOW (ref 80.0–100.0)
Platelets: 429 10*3/uL — ABNORMAL HIGH (ref 150–400)
RBC: 5.11 MIL/uL (ref 3.87–5.11)
RDW: 13.7 % (ref 11.5–15.5)
WBC: 8.1 10*3/uL (ref 4.0–10.5)
nRBC: 0 % (ref 0.0–0.2)

## 2021-05-10 LAB — POC INFLUENZA A AND B ANTIGEN (URGENT CARE ONLY)
INFLUENZA A ANTIGEN, POC: NEGATIVE
INFLUENZA B ANTIGEN, POC: NEGATIVE

## 2021-05-10 MED ORDER — OSELTAMIVIR PHOSPHATE 75 MG PO CAPS: 75.0000 mg | ORAL_CAPSULE | Freq: Two times a day (BID) | ORAL | 0 refills | Status: DC

## 2021-05-10 MED ORDER — IBUPROFEN 600 MG PO TABS: 600.0000 mg | ORAL_TABLET | Freq: Four times a day (QID) | ORAL | 0 refills | Status: DC | PRN

## 2021-05-10 MED ORDER — BENZONATATE 200 MG PO CAPS: 200.0000 mg | ORAL_CAPSULE | Freq: Three times a day (TID) | ORAL | 0 refills | Status: DC | PRN

## 2021-05-10 MED ORDER — FLUTICASONE PROPIONATE 50 MCG/ACT NA SUSP: 2.0000 | Freq: Every day | NASAL | 0 refills | Status: DC

## 2021-05-10 MED ORDER — MELOXICAM 15 MG PO TABS: 15.0000 mg | ORAL_TABLET | Freq: Every day | ORAL | 0 refills | Status: AC

## 2021-05-10 NOTE — ED Provider Notes (Signed)
HPI  SUBJECTIVE:  Anna Walls is a 40 y.o. female who presents with multiple issues.  She reports intermittent body aches for "years" and is "cold all the time" that has been going on for years.  It has gotten worse over the past 4 weeks. she is concerned that she may have anemia and is requesting testing for this.  Her acute issue is 2 days of left ear pain, sore throat, fevers T-max 102, coughing, nasal congestion, rhinorrhea, headache, wheezing.  No sinus pain or pressure, loss of sense of smell or taste, shortness of breath, nausea, vomiting, diarrhea, abdominal pain.  Her daughter who is with her has had similar symptoms for the past 3 days as well.  No antipyretic in the past 6 hours.  She has been taking ibuprofen and Naprosyn for her body aches with temporary improvement.  She has tried hot tea and allergy medicine for her flulike symptoms.  Her body aches are worse with sitting and standing.  No antipyretic in the past 6 hours.  No known COVID exposure.  Her other daughter tested positive for influenza A last week.  Her daughter who is with her today has also tested positive for influenza A.  She has a past medical history of COVID x2, hypertension-has not been on medications in years, pericarditis, sickle cell trait.  No history of anemia, pulmonary disease, smoking, hypercholesterolemia, Sickle cell disease, fibromyalgia.  States that she has had her thyroid tested before and it has been normal.  PMD: None.   Past Medical History:  Diagnosis Date   Chronic headaches    Hyperemesis    Low back pain    Pericardial effusion    a. Trace by echo 12/2015.   Pericarditis    a. Post-partum 12/2015.   Preeclampsia in postpartum period    Sickle cell trait (HCC)     History reviewed. No pertinent surgical history.  Family History  Family history unknown: Yes    Social History   Tobacco Use   Smoking status: Never   Smokeless tobacco: Never  Vaping Use   Vaping Use: Never used   Substance Use Topics   Alcohol use: No   Drug use: No    No current facility-administered medications for this encounter.  Current Outpatient Medications:    benzonatate (TESSALON) 200 MG capsule, Take 1 capsule (200 mg total) by mouth 3 (three) times daily as needed for cough., Disp: 30 capsule, Rfl: 0   fluticasone (FLONASE) 50 MCG/ACT nasal spray, Place 2 sprays into both nostrils daily., Disp: 16 g, Rfl: 0   meloxicam (MOBIC) 15 MG tablet, Take 1 tablet (15 mg total) by mouth daily for 10 days., Disp: 10 tablet, Rfl: 0   oseltamivir (TAMIFLU) 75 MG capsule, Take 1 capsule (75 mg total) by mouth 2 (two) times daily. X 5 days, Disp: 10 capsule, Rfl: 0   Aspirin-Acetaminophen-Caffeine (MIGRAINE RELIEF PO), Take by mouth., Disp: , Rfl:    nortriptyline (PAMELOR) 10 MG capsule, Take 3 capsules (30 mg total) by mouth at bedtime., Disp: 90 capsule, Rfl: 3   ondansetron (ZOFRAN ODT) 4 MG disintegrating tablet, Take 1 tablet (4 mg total) by mouth every 8 (eight) hours as needed for nausea or vomiting., Disp: 20 tablet, Rfl: 0  No Known Allergies   ROS  As noted in HPI.   Physical Exam  BP (!) 148/99 (BP Location: Left Arm)   Pulse (!) 111   Temp 98.3 F (36.8 C) (Oral)   Resp 18  LMP 04/29/2021   SpO2 94%   Constitutional: Well developed, well nourished, no acute distress appears ill. Eyes:  EOMI, conjunctiva normal bilaterally HENT: Normocephalic, atraumatic,mucus membranes moist.  Positive nasal congestion.  Positive maxillary, frontal sinus tenderness.  Normal oropharynx, normal tonsils without exudates.  Uvula midline.  Left external ear, EAC, TM normal. Respiratory: Normal inspiratory effort, lungs clear bilaterally Cardiovascular: Regular tachycardia no murmurs rubs gallop GI: nondistended skin: No rash, skin intact Musculoskeletal: no deformities Neurologic: Alert & oriented x 3, no focal neuro deficits Psychiatric: Speech and behavior appropriate   ED  Course   Medications - No data to display  Orders Placed This Encounter  Procedures   SARS CORONAVIRUS 2 (TAT 6-24 HRS) Nasopharyngeal Nasopharyngeal Swab    Standing Status:   Standing    Number of Occurrences:   1   CBC    Standing Status:   Standing    Number of Occurrences:   1   POC Influenza A & B Ag (Urgent Care)    Standing Status:   Standing    Number of Occurrences:   1    Results for orders placed or performed during the hospital encounter of 05/10/21 (from the past 24 hour(s))  CBC     Status: Abnormal   Collection Time: 05/10/21  7:06 PM  Result Value Ref Range   WBC 8.1 4.0 - 10.5 K/uL   RBC 5.11 3.87 - 5.11 MIL/uL   Hemoglobin 14.1 12.0 - 15.0 g/dL   HCT 24.2 35.3 - 61.4 %   MCV 75.3 (L) 80.0 - 100.0 fL   MCH 27.6 26.0 - 34.0 pg   MCHC 36.6 (H) 30.0 - 36.0 g/dL   RDW 43.1 54.0 - 08.6 %   Platelets 429 (H) 150 - 400 K/uL   nRBC 0.0 0.0 - 0.2 %  POC Influenza A & B Ag (Urgent Care)     Status: None   Collection Time: 05/10/21  7:32 PM  Result Value Ref Range   INFLUENZA A ANTIGEN, POC NEGATIVE NEGATIVE   INFLUENZA B ANTIGEN, POC NEGATIVE NEGATIVE   No results found.   ED Clinical Impression  1. Influenza   2. Encounter for laboratory testing for COVID-19 virus      ED Assessment/Plan  Rapid flu is negative, however, both of her daughters are influenza A positive.  Patient is tachycardic.  Will treat with Tamiflu.  Also checking CBC.  Discussed with patient that I would probably not be able to find out why she is having body aches for this long, she will need to follow-up with a primary care provider of her choice for further investigation.  In the meantime, Mobic 15 mg in the morning with 1000 mg of Tylenol.  May take an additional 1000 mg of Tylenol 3 more times a day.  Discontinue allergy medication, start Flonase, Tessalon, saline nasal irrigation.  Follow-up with primary care provider of her choice.  Providing primary care list and ordering  assistance in finding a PMD.  CBC normal.  I advised patient that I will contact her only if her CBC was abnormal and needed immediate intervention.  COVID pending at the time of signing of this note.  Discussed labs, MDM, treatment plan, and plan for follow-up with patient. Discussed sn/sx that should prompt return to the ED. patient agrees with plan.   Meds ordered this encounter  Medications   oseltamivir (TAMIFLU) 75 MG capsule    Sig: Take 1 capsule (75 mg total) by mouth  2 (two) times daily. X 5 days    Dispense:  10 capsule    Refill:  0   fluticasone (FLONASE) 50 MCG/ACT nasal spray    Sig: Place 2 sprays into both nostrils daily.    Dispense:  16 g    Refill:  0   DISCONTD: ibuprofen (ADVIL) 600 MG tablet    Sig: Take 1 tablet (600 mg total) by mouth every 6 (six) hours as needed.    Dispense:  30 tablet    Refill:  0   benzonatate (TESSALON) 200 MG capsule    Sig: Take 1 capsule (200 mg total) by mouth 3 (three) times daily as needed for cough.    Dispense:  30 capsule    Refill:  0   meloxicam (MOBIC) 15 MG tablet    Sig: Take 1 tablet (15 mg total) by mouth daily for 10 days.    Dispense:  10 tablet    Refill:  0       *This clinic note was created using Scientist, clinical (histocompatibility and immunogenetics). Therefore, there may be occasional mistakes despite careful proofreading.  ?    Domenick Gong, MD 05/11/21 [phone]

## 2021-05-10 NOTE — ED Triage Notes (Signed)
Pt is c/o generalized body aches, productive cough with yellow-colored sputum, sore throat, and fever for the past 4 weeks; pt also states that her blood pressure has been unstable for the past 3 days; pt states that she feels cold all the time and would like to be checked if she is anemic

## 2021-05-10 NOTE — Discharge Instructions (Addendum)
I will contact you only if your CBC is significantly abnormal and needs acute intervention.  Mobic 15 mg in the morning.  Take 1000 TYLENOL with it.  may take an additional 1000 mg of Tylenol 2 or 3 more times a day.  Should help with the body aches. Your influenza testing was negative, however because both your daughters have the flu I am treating you as if you have the flu.  Finish the Tamiflu, even if you feel better.  Discontinue allergy medication, start Flonase, Tessalon, saline nasal irrigation with a Lloyd Huger Med rinse and distilled water as often as you want.   Below is a list of primary care practices who are taking new patients for you to follow-up with.  [facility] internal medicine clinic Ground Floor - [facility], [address] [place], [place], [place] [address] [phone]  [facility] Primary Care at [facility] [address] [place], [place] [address] [phone]  [facility] [address] [place], [place] [address] [phone]  [facility] [phone] [address] [place] [address]  [facility]: [address] [place] [address]  [phone]  [facility]: [address] [place] [address]  [phone]  [facility] : [address] [place] [address] [phone]  [facility] Primary Care: [address] [place] [address] [phone]  [facility]: [address] [place] [address] [phone]  Dr. Oneal Grout [address] [facility] [place] [address]  [phone]  Go to www.goodrx.com  or www.costplusdrugs.com to look up your medications. This will give you a list of where you can find your prescriptions at the most affordable prices. Or ask the pharmacist  what the cash price is, or if they have any other discount programs available to help make your medication more affordable. This can be less expensive than what you would pay with insurance.

## 2021-05-11 LAB — SARS CORONAVIRUS 2 (TAT 6-24 HRS): SARS Coronavirus 2: NEGATIVE

## 2022-01-24 ENCOUNTER — Ambulatory Visit (HOSPITAL_COMMUNITY)
Admission: EM | Admit: 2022-01-24 | Discharge: 2022-01-24 | Disposition: A | Discharge status: Home / Self Care | Payer: 59 | Attending: Internal Medicine | Admitting: Internal Medicine

## 2022-01-24 ENCOUNTER — Encounter (HOSPITAL_COMMUNITY): Payer: Self-pay

## 2022-01-24 DIAGNOSIS — M25562 Pain in left knee: Secondary | ICD-10-CM

## 2022-01-24 DIAGNOSIS — M5432 Sciatica, left side: Secondary | ICD-10-CM | POA: Diagnosis not present

## 2022-01-24 MED ORDER — PREDNISONE 20 MG PO TABS: 40.0000 mg | ORAL_TABLET | Freq: Every day | ORAL | 0 refills | Status: AC

## 2022-01-24 NOTE — ED Triage Notes (Signed)
Pt reports left back pain radiating down to her left leg x 2 weeks.  She is taking OTC pain medication and wearing a knee brace.

## 2022-01-24 NOTE — ED Provider Notes (Addendum)
[facility]    CSN: [id number] Arrival date & time: 01/24/22  1927      History   Chief Complaint Chief Complaint  Patient presents with   Knee Pain   Leg Pain    HPI Anna Walls is a 41 y.o. female.   Patient presents with left lower back pain that radiates down left leg.  She is also having some left knee pain.  These pains have been present for about 2 weeks.  Denies any numbness or tingling.  Patient does have a history of left knee pain and sciatica of the left side.  Patient fell on her knee a few years prior.  Patient states that she started a new job with prolonged standing so is attributing her symptoms to this.  Patient has taken ibuprofen, applied ice, applied knee brace, applied over-the-counter topical medications with minimal improvement.  Denies urinary frequency, urinary or bowel continence, saddle anesthesia. Denies any recent injury.    Knee Pain Leg Pain   Past Medical History:  Diagnosis Date   Chronic headaches    Hyperemesis    Low back pain    Pericardial effusion    a. Trace by echo 12/2015.   Pericarditis    a. Post-partum 12/2015.   Preeclampsia in postpartum period    Sickle cell trait [facility])     Patient Active Problem List   Diagnosis Date Noted   Chronic nonintractable headache 05/06/2017   Tympanic membrane rupture, right 05/06/2017   Mastitis, right, acute 01/08/2016   Postoperative pulmonary edema (HCC) 01/07/2016   Chest pain at rest    Shortness of breath    Acute pericarditis    Preeclampsia in postpartum period 01/06/2016   Supervision of high risk pregnancy, antepartum 06/25/2015    History reviewed. No pertinent surgical history.  OB History     Gravida  5   Para  5   Term  5   Preterm      AB      Living  4      SAB      IAB      Ectopic      Multiple  0   Live Births  4            Home Medications    Prior to Admission medications   Medication Sig Start Date End Date Taking?  Authorizing Provider  predniSONE (DELTASONE) 20 MG tablet Take 2 tablets (40 mg total) by mouth daily for 5 days. 01/24/22 01/29/22 Yes Mando Blatz, Acie Fredrickson, FNP  Aspirin-Acetaminophen-Caffeine (MIGRAINE RELIEF PO) Take by mouth.    [provider]  benzonatate (TESSALON) 200 MG capsule Take 1 capsule (200 mg total) by mouth 3 (three) times daily as needed for cough. 05/10/21   Domenick Gong, MD  fluticasone (FLONASE) 50 MCG/ACT nasal spray Place 2 sprays into both nostrils daily. 05/10/21   Domenick Gong, MD  nortriptyline (PAMELOR) 10 MG capsule Take 3 capsules (30 mg total) by mouth at bedtime. 10/05/19   Huston Foley, MD  ondansetron (ZOFRAN ODT) 4 MG disintegrating tablet Take 1 tablet (4 mg total) by mouth every 8 (eight) hours as needed for nausea or vomiting. 03/29/20   Particia Nearing, PA-C  oseltamivir (TAMIFLU) 75 MG capsule Take 1 capsule (75 mg total) by mouth 2 (two) times daily. X 5 days 05/10/21   Domenick Gong, MD    Family History Family History  Family history unknown: Yes    Social History Social History  Tobacco Use   Smoking status: Never   Smokeless tobacco: Never  Vaping Use   Vaping Use: Never used  Substance Use Topics   Alcohol use: No   Drug use: No     Allergies   Patient has no known allergies.   Review of Systems Review of Systems Per HPI  Physical Exam Triage Vital Signs ED Triage Vitals [01/24/22 1937]  Enc Vitals Group     BP (!) 156/102     Pulse Rate 97     Resp 20     Temp (!) 97.1 F (36.2 C)     Temp Source Oral     SpO2 98 %     Weight      Height      Head Circumference      Peak Flow      Pain Score      Pain Loc      Pain Edu?      Excl. in GC?    No data found.  Updated Vital Signs BP (!) 156/102 (BP Location: Left Arm)   Pulse 97   Temp (!) 97.1 F (36.2 C) (Oral)   Resp 20   SpO2 98%   Visual Acuity Right Eye Distance:   Left Eye Distance:   Bilateral Distance:    Right Eye Near:    Left Eye Near:    Bilateral Near:     Physical Exam Constitutional:      General: She is not in acute distress.    Appearance: Normal appearance. She is not toxic-appearing or diaphoretic.  HENT:     Head: Normocephalic and atraumatic.  Eyes:     Extraocular Movements: Extraocular movements intact.     Conjunctiva/sclera: Conjunctivae normal.  Pulmonary:     Effort: Pulmonary effort is normal.  Musculoskeletal:     Comments: Tenderness to left lower back that extends into the left buttock/left hip.  No direct spinal tenderness, crepitus, step-off.  Tenderness to palpation to medial knee and directly above patella.  Mild swelling noted.  No obvious discoloration, warmth, lacerations, abrasions noted.  Patient has full range of motion of knee.  Neurovascular intact throughout.  Neurological:     General: No focal deficit present.     Mental Status: She is alert and oriented to person, place, and time. Mental status is at baseline.     Deep Tendon Reflexes: Reflexes are normal and symmetric.  Psychiatric:        Mood and Affect: Mood normal.        Behavior: Behavior normal.        Thought Content: Thought content normal.        Judgment: Judgment normal.      UC Treatments / Results  Labs (all labs ordered are listed, but only abnormal results are displayed) Labs Reviewed - No data to display  EKG   Radiology No results found.  Procedures Procedures (including critical care time)  Medications Ordered in UC Medications - No data to display  Initial Impression / Assessment and Plan / UC Course  I have reviewed the triage vital signs and the nursing notes.  Pertinent labs & imaging results that were available during my care of the patient were reviewed by me and considered in my medical decision making (see chart for details).     Patient has sciatica of left side as well as left knee pain.  Both of these appear acute on chronic.  Do not think that imaging  is  necessary given chronicity of pain as well as no apparent injury.  Will prescribe low-dose and short course of prednisone given symptoms have been refractory to other medications.  Also advised to continue knee brace, ice application, supportive care.  Patient to follow-up with orthopedist at provided contact information for further evaluation and management.  Patient has taken prednisone before and has tolerated well.  There does not appear to be any obvious contraindications to prednisone.  Discussed return precautions.  Patient verbalized understanding and was agreeable with plan. Final Clinical Impressions(s) / UC Diagnoses   Final diagnoses:  Acute pain of left knee  Sciatica of left side     Discharge Instructions      It appears that you are having inflammation of your knee as well as sciatica.  You have been prescribed prednisone to help alleviate inflammation.  Also recommend ice application and continuing knee brace.  Please follow-up with orthopedist on Monday for further evaluation and management.    ED Prescriptions     Medication Sig Dispense Auth. Provider   predniSONE (DELTASONE) 20 MG tablet Take 2 tablets (40 mg total) by mouth daily for 5 days. 10 tablet Gustavus Bryant, [place]      PDMP not reviewed this encounter.   Gustavus Bryant, [place] 01/24/22 [address] [place] 01/24/22 2008

## 2022-01-24 NOTE — Discharge Instructions (Signed)
It appears that you are having inflammation of your knee as well as sciatica.  You have been prescribed prednisone to help alleviate inflammation.  Also recommend ice application and continuing knee brace.  Please follow-up with orthopedist on Monday for further evaluation and management.

## 2022-06-21 ENCOUNTER — Emergency Department (HOSPITAL_COMMUNITY)
Admission: EM | Admit: 2022-06-21 | Discharge: 2022-06-21 | Disposition: A | Discharge status: Home / Self Care | Payer: 59 | Attending: Emergency Medicine | Admitting: Emergency Medicine

## 2022-06-21 ENCOUNTER — Emergency Department (HOSPITAL_COMMUNITY): Payer: 59

## 2022-06-21 DIAGNOSIS — G43909 Migraine, unspecified, not intractable, without status migrainosus: Secondary | ICD-10-CM | POA: Insufficient documentation

## 2022-06-21 DIAGNOSIS — I10 Essential (primary) hypertension: Secondary | ICD-10-CM | POA: Insufficient documentation

## 2022-06-21 LAB — CBC WITH DIFFERENTIAL/PLATELET
Abs Immature Granulocytes: 0.02 10*3/uL (ref 0.00–0.07)
Basophils Absolute: 0.1 10*3/uL (ref 0.0–0.1)
Basophils Relative: 1 %
Eosinophils Absolute: 0.1 10*3/uL (ref 0.0–0.5)
Eosinophils Relative: 2 %
HCT: 41.3 % (ref 36.0–46.0)
Hemoglobin: 15.3 g/dL — ABNORMAL HIGH (ref 12.0–15.0)
Immature Granulocytes: 0 %
Lymphocytes Relative: 26 %
Lymphs Abs: 2.1 10*3/uL (ref 0.7–4.0)
MCH: 27.5 pg (ref 26.0–34.0)
MCHC: 37 g/dL — ABNORMAL HIGH (ref 30.0–36.0)
MCV: 74.1 fL — ABNORMAL LOW (ref 80.0–100.0)
Monocytes Absolute: 0.4 10*3/uL (ref 0.1–1.0)
Monocytes Relative: 5 %
Neutro Abs: 5.3 10*3/uL (ref 1.7–7.7)
Neutrophils Relative %: 66 %
Platelets: 465 10*3/uL — ABNORMAL HIGH (ref 150–400)
RBC: 5.57 MIL/uL — ABNORMAL HIGH (ref 3.87–5.11)
RDW: 12.8 % (ref 11.5–15.5)
WBC: 8 10*3/uL (ref 4.0–10.5)
nRBC: 0 % (ref 0.0–0.2)

## 2022-06-21 LAB — BASIC METABOLIC PANEL
Anion gap: 10 (ref 5–15)
BUN: 6 mg/dL (ref 6–20)
CO2: 25 mmol/L (ref 22–32)
Calcium: 9.5 mg/dL (ref 8.9–10.3)
Chloride: 100 mmol/L (ref 98–111)
Creatinine, Ser: 0.71 mg/dL (ref 0.44–1.00)
GFR, Estimated: >60 mL/min (ref >60)
Glucose, Bld: 132 mg/dL — ABNORMAL HIGH (ref 70–99)
Potassium: 3.4 mmol/L — ABNORMAL LOW (ref 3.5–5.1)
Sodium: 135 mmol/L (ref 135–145)

## 2022-06-21 LAB — MAGNESIUM: Magnesium: 1.9 mg/dL (ref 1.7–2.4)

## 2022-06-21 LAB — I-STAT BETA HCG BLOOD, ED (MC, WL, AP ONLY): I-stat hCG, quantitative: <5 m[IU]/mL (ref <5)

## 2022-06-21 LAB — CK: Total CK: 65 U/L (ref 38–234)

## 2022-06-21 MED ORDER — KETOROLAC TROMETHAMINE 30 MG/ML IJ SOLN
30.0000 mg | Freq: Once | INTRAMUSCULAR | Status: AC
  Administered 2022-06-21: 30 mg via INTRAVENOUS
  Filled 2022-06-21: qty 1

## 2022-06-21 MED ORDER — DIPHENHYDRAMINE HCL 50 MG/ML IJ SOLN
25.0000 mg | Freq: Once | INTRAMUSCULAR | Status: AC
  Administered 2022-06-21: 25 mg via INTRAVENOUS
  Filled 2022-06-21: qty 1

## 2022-06-21 MED ORDER — METOCLOPRAMIDE HCL 5 MG/ML IJ SOLN
10.0000 mg | Freq: Once | INTRAMUSCULAR | Status: AC
  Administered 2022-06-21: 10 mg via INTRAVENOUS
  Filled 2022-06-21: qty 2

## 2022-06-21 MED ORDER — SODIUM CHLORIDE 0.9 % IV SOLN: INTRAVENOUS | Status: DC

## 2022-06-21 MED ORDER — LISINOPRIL 10 MG PO TABS: 10.0000 mg | ORAL_TABLET | Freq: Every day | ORAL | 1 refills | Status: AC

## 2022-06-21 NOTE — ED Provider Notes (Signed)
[facility] EMERGENCY DEPARTMENT Provider Note   CSN: [id number] Arrival date & time: 06/21/22  1457     History  Chief Complaint  Patient presents with   Headache    Anna Walls is a 41 y.o. female.   Headache  This patient is a 41 year old female, she reports a history of hypertension which was recently diagnosed and she was placed on amlodipine and hydrochlorothiazide, she took her first doses of these medications yesterday evening at 6:00 and by 11:00 in the evening she had a headache which she describes as a migraine.  The headache is located in the bitemporal area with some radiation to the top of the head, it feels similar to prior migraine headaches which she states she has had over the years but this 1 seems to be stronger.  She has not taken any blood pressure medicine in 6 years since she was pregnant, she denies any coughing chest pain abdominal pain shortness of breath swelling of the legs.  She does endorse having frequent urination overnight which she has never had until she took those medications.  She denies any sensitivity to light, denies any numbness or weakness of the arms of the legs, denies any difficulty ambulating.  She has taken some over-the-counter medication for headache but it is still there    Home Medications Prior to Admission medications   Medication Sig Start Date End Date Taking? Authorizing Provider  lisinopril (ZESTRIL) 10 MG tablet Take 1 tablet (10 mg total) by mouth daily. 06/21/22  Yes Eber Hong, MD  Aspirin-Acetaminophen-Caffeine (MIGRAINE RELIEF PO) Take by mouth.    [provider]  nortriptyline (PAMELOR) 10 MG capsule Take 3 capsules (30 mg total) by mouth at bedtime. 10/05/19   Huston Foley, MD      Allergies    Patient has no known allergies.    Review of Systems   Review of Systems  Neurological:  Positive for headaches.  All other systems reviewed and are negative.   Physical Exam Updated  Vital Signs BP (!) 130/92   Pulse 83   Temp 98.2 F (36.8 C) (Oral)   Resp 16   SpO2 98%  Physical Exam Vitals and nursing note reviewed.  Constitutional:      General: She is not in acute distress.    Appearance: She is well-developed.  HENT:     Head: Normocephalic and atraumatic.     Comments: There is tenderness to palpation over the scalp and both the bitemporal area as well as the crown of the head.  She states this reproduces the pain    Mouth/Throat:     Pharynx: No oropharyngeal exudate.  Eyes:     General: No scleral icterus.       Right eye: No discharge.        Left eye: No discharge.     Conjunctiva/sclera: Conjunctivae normal.     Pupils: Pupils are equal, round, and reactive to light.  Neck:     Thyroid: No thyromegaly.     Vascular: No JVD.  Cardiovascular:     Rate and Rhythm: Normal rate and regular rhythm.     Heart sounds: Normal heart sounds. No murmur heard.    No friction rub. No gallop.  Pulmonary:     Effort: Pulmonary effort is normal. No respiratory distress.     Breath sounds: Normal breath sounds. No wheezing or rales.  Abdominal:     General: Bowel sounds are normal. There is no distension.  Palpations: Abdomen is soft. There is no mass.     Tenderness: There is no abdominal tenderness.  Musculoskeletal:        General: No tenderness. Normal range of motion.     Cervical back: Normal range of motion and neck supple.  Lymphadenopathy:     Cervical: No cervical adenopathy.  Skin:    General: Skin is warm and dry.     Findings: No erythema or rash.  Neurological:     Mental Status: She is alert.     Coordination: Coordination normal.     Comments: Speech is clear, cranial nerves III through XII are intact, memory is intact, strength is normal in all 4 extremities including grips, sensation is intact to light touch and pinprick in all 4 extremities. Coordination as tested by finger-nose-finger is normal, no limb ataxia. Normal gait, normal  reflexes at the patellar tendons bilaterally  Psychiatric:        Behavior: Behavior normal.     ED Results / Procedures / Treatments   Labs (all labs ordered are listed, but only abnormal results are displayed) Labs Reviewed  CBC WITH DIFFERENTIAL/PLATELET - Abnormal; Notable for the following components:      Result Value   RBC 5.57 (*)    Hemoglobin 15.3 (*)    MCV 74.1 (*)    MCHC 37.0 (*)    Platelets 465 (*)    All other components within normal limits  BASIC METABOLIC PANEL - Abnormal; Notable for the following components:   Potassium 3.4 (*)    Glucose, Bld 132 (*)    All other components within normal limits  CK  MAGNESIUM  I-STAT BETA HCG BLOOD, ED ([facility], [facility], [facility] ONLY)    EKG None  Radiology CT Head Wo Contrast  Result Date: 06/21/2022 CLINICAL DATA:  Headaches, hypertension EXAM: CT HEAD WITHOUT CONTRAST TECHNIQUE: Contiguous axial images were obtained from the base of the skull through the vertex without intravenous contrast. RADIATION DOSE REDUCTION: This exam was performed according to the departmental dose-optimization program which includes automated exposure control, adjustment of the mA and/or kV according to patient size and/or use of iterative reconstruction technique. COMPARISON:  04/10/2019 FINDINGS: Brain: No acute intracranial findings are seen in noncontrast CT brain. There are no signs of bleeding within the cranium. Ventricles are not dilated. There is no focal edema or mass effect. Cortical sulci are prominent. Vascular: Unremarkable. Skull: No fracture is seen. There is mild anterior convexity in the outer table of left frontal calvarium with no break in the cortical margins. This finding appears stable. Sinuses/Orbits: There is mild mucosal thickening in it might sinus. Other: There is increased amount of CSF insula suggesting partial empty sella. IMPRESSION: No acute intracranial findings are seen in noncontrast CT brain. Electronically Signed   By: Ernie Avena M.D.   On: 06/21/2022 16:36    Procedures Procedures    Medications Ordered in ED Medications  0.9 %  sodium chloride infusion ( Intravenous New Bag/Given 06/21/22 1643)  ketorolac (TORADOL) 30 MG/ML injection 30 mg (30 mg Intravenous Given 06/21/22 1613)  metoCLOPramide (REGLAN) injection 10 mg (10 mg Intravenous Given 06/21/22 1614)  diphenhydrAMINE (BENADRYL) injection 25 mg (25 mg Intravenous Given 06/21/22 1613)    ED Course/ Medical Decision Making/ A&P                           Medical Decision Making Risk Prescription drug management.   This patient  presents to the ED for concern of headache after starting medications, this involves an extensive number of treatment options, and is a complaint that carries with it a high risk of complications and morbidity.  The differential diagnosis includes medication reaction, migraine, tension, cluster, seems less likely to be thing such as subarachnoid or aneurysm or tumor.  Reviewing the medical record she had a CT scan of the head in 2020 which was negative.   Co morbidities that complicate the patient evaluation  Hypertension   Additional history obtained:  Additional history obtained from electronic medical record External records from outside source obtained and reviewed including prior CT scan of the brain which was negative   Lab Tests:  I Ordered, and personally interpreted labs.  The pertinent results include: CBC and metabolic panel both are unremarkable, there is no renal dysfunction, no anemia   Imaging Studies ordered:  I ordered imaging studies including CT scan of the head I independently visualized and interpreted imaging which showed no acute findings I agree with the radiologist interpretation   Cardiac Monitoring: / EKG:  The patient was maintained on a cardiac monitor.  I personally viewed and interpreted the cardiac monitored which showed an underlying rhythm of: Normal sinus  rhythm     Problem List / ED Course / Critical interventions / Medication management  Headache resolved with medications I ordered medication including Toradol Reglan and Benadryl for headache Reevaluation of the patient after these medicines showed that the patient resolved I have reviewed the patients home medicines and have made adjustments as needed   Social Determinants of Health:  None   Test / Admission - Considered:  Stated admission but there is nothing on CT scan that would warrant further evaluation or need to consult with specialist.         Final Clinical Impression(s) / ED Diagnoses Final diagnoses:  Migraine without status migrainosus, not intractable, unspecified migraine type  Primary hypertension    Rx / DC Orders ED Discharge Orders          Ordered    lisinopril (ZESTRIL) 10 MG tablet  Daily        06/21/22 1738              Eber Hong, MD 06/21/22 1741

## 2022-06-21 NOTE — ED Provider Triage Note (Signed)
Emergency Medicine Provider Triage Evaluation Note  Anna Walls , a 41 y.o. female  was evaluated in triage.  Pt complains of headache, muscle cramps, and polyuria that started last night.  Patient diagnosed with hypertension yesterday and started on amlodipine and HCTZ.  She started developing a headache last night around 10 PM.  History of migraines.  Patient states is the worst headache she has ever had.  She admits to vomiting.  No chest pain or shortness of breath.  Review of Systems  Positive: headache Negative: Speech changes  Physical Exam  BP (!) 138/109 (BP Location: Right Arm)   Pulse 100   Temp 98.2 F (36.8 C) (Oral)   Resp 17   SpO2 95%  Gen:   Awake, no distress   Resp:  Normal effort  MSK:   Moves extremities without difficulty  Other:    Medical Decision Making  Medically screening exam initiated at 3:15 PM.  Appropriate orders placed.  Anna Walls was informed that the remainder of the evaluation will be completed by another provider, this initial triage assessment does not replace that evaluation, and the importance of remaining in the ED until their evaluation is complete.  Labs CT head   Anna Walls 06/21/22 1517

## 2022-06-21 NOTE — Discharge Instructions (Signed)
I would like for you to stop taking the blood pressure medication called amlodipine and the blood pressure medication called hydrochlorothiazide.  1 of these 2 medications likely caused her headache this evening.  Instead I have prescribed a medication called lisinopril, this is a once a day medication, there is a small risk of a cough and a small risk of having swelling of your lips or your tongue.  Just like all medications there are potential side effects.  If either of these happens stop taking the medication immediately and talk to your doctor or return to the ER if you have swelling of the face tongues or lips.

## 2022-06-21 NOTE — ED Triage Notes (Signed)
Patient here for further evaluation after she was seen yesterday at an urgent care, diagnosed with hypertension, and prescribed amlodipine and HCTZ. Patient here with complaint of headache, lower extremity cramping, and polyuria after taking one dose of each of these medications. Patient is alert, oriented, and in no apparent distress at this time.

## 2022-07-22 IMAGING — DX DG CHEST 2V
2 series · 2 of 2 positions shown · non-contrast
Comparison: Chest x-ray 09/26/2019.

CLINICAL DATA: 39-year-old female with history of persistent cough
and shortness of breath. History of COVID infection.

EXAM:
CHEST - 2 VIEW

[chest pa]
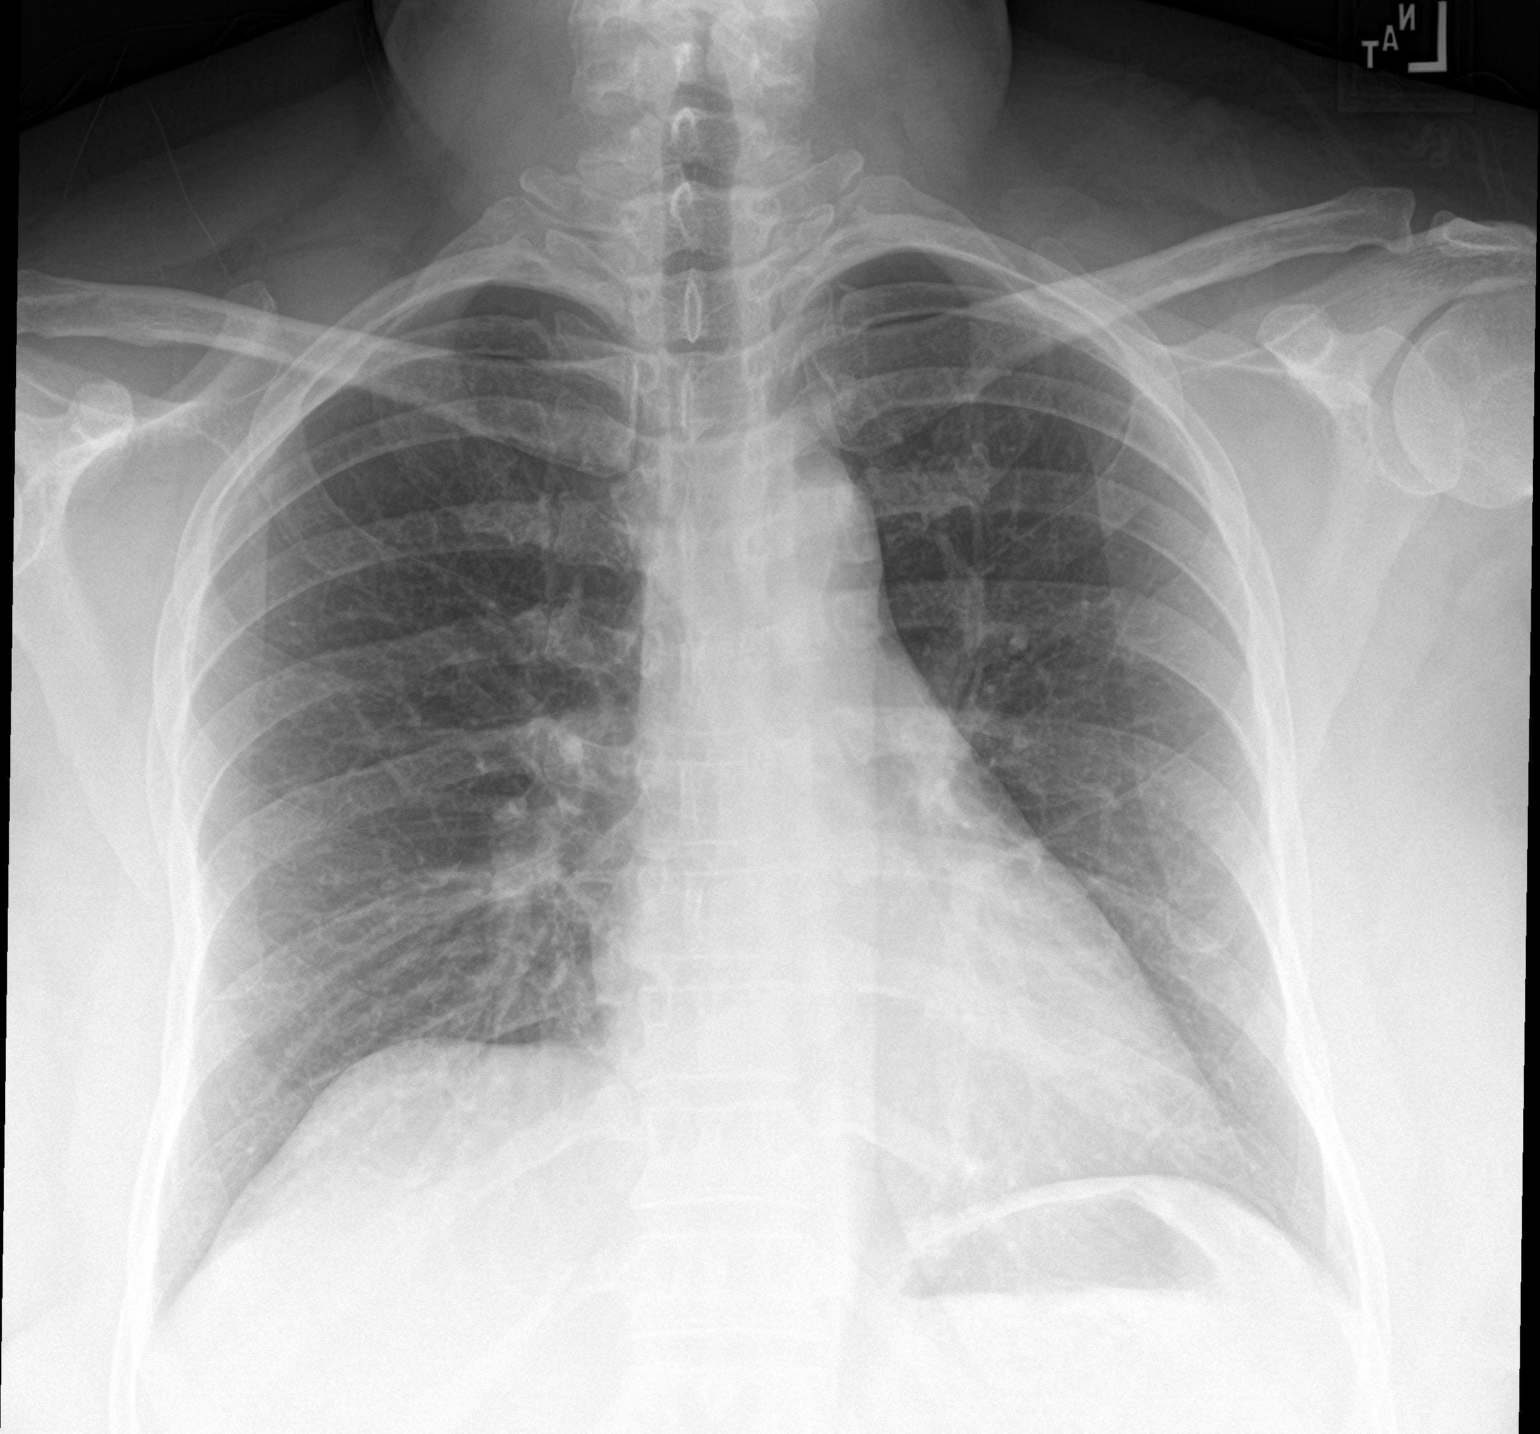

[chest lat]
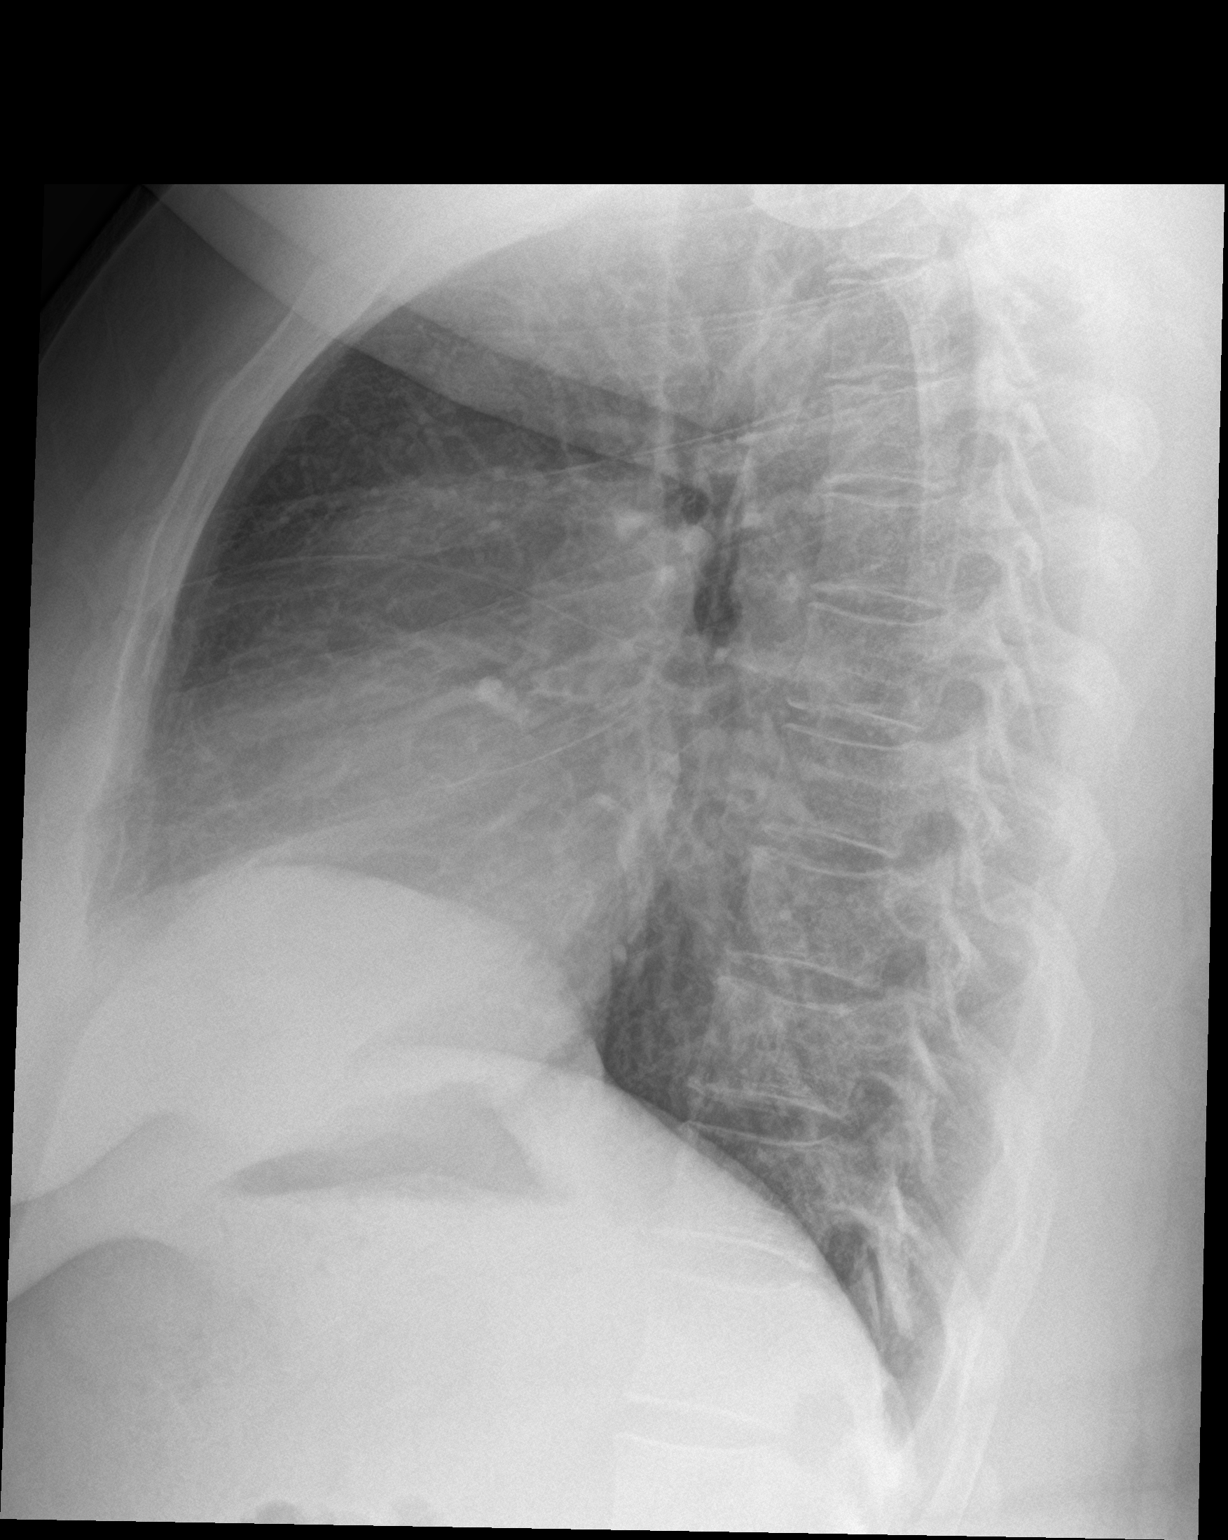

[2 of 2 positions shown; findings below may reference images not displayed]

FINDINGS: Lung volumes are normal. No consolidative airspace disease. No
pleural effusions. No pneumothorax. No pulmonary nodule or mass
noted. Pulmonary vasculature and the cardiomediastinal silhouette
are within normal limits.
IMPRESSION: No radiographic evidence of acute cardiopulmonary disease.

## 2022-11-19 ENCOUNTER — Ambulatory Visit (INDEPENDENT_AMBULATORY_CARE_PROVIDER_SITE_OTHER): Payer: Self-pay | Admitting: Primary Care

## 2022-12-16 ENCOUNTER — Emergency Department (HOSPITAL_COMMUNITY)
Admission: EM | Admit: 2022-12-16 | Discharge: 2022-12-17 | Disposition: A | Discharge status: Home / Self Care | Payer: Self-pay | Attending: Emergency Medicine | Admitting: Emergency Medicine

## 2022-12-16 ENCOUNTER — Encounter (HOSPITAL_COMMUNITY): Payer: Self-pay

## 2022-12-16 DIAGNOSIS — R55 Syncope and collapse: Secondary | ICD-10-CM | POA: Insufficient documentation

## 2022-12-16 DIAGNOSIS — Z79899 Other long term (current) drug therapy: Secondary | ICD-10-CM | POA: Insufficient documentation

## 2022-12-16 DIAGNOSIS — I10 Essential (primary) hypertension: Secondary | ICD-10-CM | POA: Insufficient documentation

## 2022-12-16 DIAGNOSIS — E876 Hypokalemia: Secondary | ICD-10-CM | POA: Insufficient documentation

## 2022-12-16 LAB — BASIC METABOLIC PANEL
Anion gap: 10 (ref 5–15)
BUN: 10 mg/dL (ref 6–20)
CO2: 25 mmol/L (ref 22–32)
Calcium: 9.1 mg/dL (ref 8.9–10.3)
Chloride: 102 mmol/L (ref 98–111)
Creatinine, Ser: 0.65 mg/dL (ref 0.44–1.00)
GFR, Estimated: >60 mL/min (ref >60)
Glucose, Bld: 146 mg/dL — ABNORMAL HIGH (ref 70–99)
Potassium: 2.7 mmol/L — CL (ref 3.5–5.1)
Sodium: 137 mmol/L (ref 135–145)

## 2022-12-16 LAB — URINALYSIS, ROUTINE W REFLEX MICROSCOPIC
Bilirubin Urine: NEGATIVE
Glucose, UA: NEGATIVE mg/dL
Hgb urine dipstick: NEGATIVE
Ketones, ur: NEGATIVE mg/dL
Leukocytes,Ua: NEGATIVE
Nitrite: NEGATIVE
Protein, ur: NEGATIVE mg/dL
Specific Gravity, Urine: 1.01 (ref 1.005–1.030)
pH: 6 (ref 5.0–8.0)

## 2022-12-16 LAB — CBC
HCT: 36.3 % (ref 36.0–46.0)
Hemoglobin: 13.3 g/dL (ref 12.0–15.0)
MCH: 27.8 pg (ref 26.0–34.0)
MCHC: 36.6 g/dL — ABNORMAL HIGH (ref 30.0–36.0)
MCV: 75.9 fL — ABNORMAL LOW (ref 80.0–100.0)
Platelets: 436 10*3/uL — ABNORMAL HIGH (ref 150–400)
RBC: 4.78 MIL/uL (ref 3.87–5.11)
RDW: 13.2 % (ref 11.5–15.5)
WBC: 6.7 10*3/uL (ref 4.0–10.5)
nRBC: 0 % (ref 0.0–0.2)

## 2022-12-16 LAB — CBG MONITORING, ED: Glucose-Capillary: 154 mg/dL — ABNORMAL HIGH (ref 70–99)

## 2022-12-16 LAB — I-STAT BETA HCG BLOOD, ED (MC, WL, AP ONLY): I-stat hCG, quantitative: <5 m[IU]/mL (ref <5)

## 2022-12-16 LAB — MAGNESIUM: Magnesium: 2.1 mg/dL (ref 1.7–2.4)

## 2022-12-16 MED ORDER — POTASSIUM CHLORIDE 10 MEQ/100ML IV SOLN
10.0000 meq | INTRAVENOUS | Status: AC
  Administered 2022-12-16 – 2022-12-17 (×4): 10 meq via INTRAVENOUS
  Filled 2022-12-16 (×4): qty 100

## 2022-12-16 MED ORDER — POTASSIUM CHLORIDE CRYS ER 20 MEQ PO TBCR
40.0000 meq | EXTENDED_RELEASE_TABLET | Freq: Once | ORAL | Status: AC
  Administered 2022-12-16: 40 meq via ORAL
  Filled 2022-12-16: qty 2

## 2022-12-16 NOTE — Discharge Instructions (Addendum)
Your labwork today showed hypokalemia of 2.7. I recommend close follow-up with PCP for reevaluation.  Please do not hesitate to return to emergency department if worrisome signs symptoms we discussed become apparent.

## 2022-12-16 NOTE — ED Triage Notes (Signed)
Pt arrives via EMS. Pt reports lightheadedness, dizziness, and generalized weakness that started this morning. Pt reports she almost passed out while at work today.

## 2022-12-16 NOTE — ED Provider Notes (Incomplete)
[place] EMERGENCY DEPARTMENT AT [facility] Provider Note   CSN: [id number] Arrival date & time: 12/16/22  1721     History {Add pertinent medical, surgical, social history, OB history to HPI:1} Chief Complaint  Patient presents with   Near Syncope    Kirsi Kalata is a 42 y.o. female with a past medical history of pericarditis, sickle cell trait, hypertension presents today for evaluation of knee syncope.  Patient stated she woke up this morning with palpitation.  She then went to work and started to have numbness in feet.  She reports having lightheadedness and fell like she was going to pass out.  She denies any fever, chest pain, shortness of breath, nausea, vomiting, urinary symptoms, blood in the stool or urine.  Reports that she has soft stool this morning but denied diarrhea.   Near Syncope      Past Medical History:  Diagnosis Date   Chronic headaches    Hyperemesis    Low back pain    Pericardial effusion    a. Trace by echo 12/2015.   Pericarditis    a. Post-partum 12/2015.   Preeclampsia in postpartum period    Sickle cell trait (HCC)    History reviewed. No pertinent surgical history.   Home Medications Prior to Admission medications   Medication Sig Start Date End Date Taking? Authorizing Provider  Aspirin-Acetaminophen-Caffeine (MIGRAINE RELIEF PO) Take by mouth.    [provider]  lisinopril (ZESTRIL) 10 MG tablet Take 1 tablet (10 mg total) by mouth daily. 06/21/22   Eber Hong, MD  nortriptyline (PAMELOR) 10 MG capsule Take 3 capsules (30 mg total) by mouth at bedtime. 10/05/19   Huston Foley, MD      Allergies    Patient has no known allergies.    Review of Systems   Review of Systems  Cardiovascular:  Positive for near-syncope.    Physical Exam Updated Vital Signs BP 111/85 (BP Location: Right Arm)   Pulse 78   Temp 98.3 F (36.8 C)   Resp 18   Ht 5\' 4"  (1.626 m)   Wt 93.9 kg   SpO2 100%   BMI 35.53 kg/m   Physical Exam Vitals and nursing note reviewed.  Constitutional:      Appearance: Normal appearance.  HENT:     Head: Normocephalic and atraumatic.     Mouth/Throat:     Mouth: Mucous membranes are moist.  Eyes:     General: No scleral icterus. Cardiovascular:     Rate and Rhythm: Normal rate and regular rhythm.     Pulses: Normal pulses.     Heart sounds: Normal heart sounds.  Pulmonary:     Effort: Pulmonary effort is normal.     Breath sounds: Normal breath sounds.  Abdominal:     General: Abdomen is flat.     Palpations: Abdomen is soft.     Tenderness: There is no abdominal tenderness.  Musculoskeletal:        General: No deformity.  Skin:    General: Skin is warm.     Findings: No rash.  Neurological:     General: No focal deficit present.     Mental Status: She is alert.     Comments: Cranial nerves II through XII intact. Intact sensation to light touch in all 4 extremities. 5/5 strength in all 4 extremities. Intact finger-to-nose and heel-to-shin of all 4 extremities. No visual field cuts. No neglect noted. No aphasia noted.    Psychiatric:  Mood and Affect: Mood normal.     ED Results / Procedures / Treatments   Labs (all labs ordered are listed, but only abnormal results are displayed) Labs Reviewed  BASIC METABOLIC PANEL - Abnormal; Notable for the following components:      Result Value   Potassium 2.7 (*)    Glucose, Bld 146 (*)    All other components within normal limits  CBC - Abnormal; Notable for the following components:   MCV 75.9 (*)    MCHC 36.6 (*)    Platelets 436 (*)    All other components within normal limits  CBG MONITORING, ED - Abnormal; Notable for the following components:   Glucose-Capillary 154 (*)    All other components within normal limits  URINALYSIS, ROUTINE W REFLEX MICROSCOPIC  MAGNESIUM  I-STAT BETA HCG BLOOD, ED ([facility], [facility], [facility] ONLY)    EKG EKG Interpretation  Date/Time:  Tuesday December 16 2022 17:41:54  EDT Ventricular Rate:  86 PR Interval:  154 QRS Duration: 86 QT Interval:  366 QTC Calculation: 437 R Axis:   4 Text Interpretation: Normal sinus rhythm Confirmed by Gloris Manchester ([id number]) on 12/16/2022 8:28:19 PM  Radiology No results found.  Procedures Procedures  {Document cardiac monitor, telemetry assessment procedure when appropriate:1}  Medications Ordered in ED Medications  potassium chloride 10 mEq in 100 mL IVPB (10 mEq Intravenous New Bag/Given 12/16/22 2106)  potassium chloride SA (KLOR-CON M) CR tablet 40 mEq (40 mEq Oral Given 12/16/22 2101)    ED Course/ Medical Decision Making/ A&P   {   Click here for ABCD2, HEART and other calculatorsREFRESH Note before signing :1}                          Medical Decision Making Amount and/or Complexity of Data Reviewed Labs: ordered.  Risk Prescription drug management.   ***  {Document critical care time when appropriate:1} {Document review of labs and clinical decision tools ie heart score, Chads2Vasc2 etc:1}  {Document your independent review of radiology images, and any outside records:1} {Document your discussion with family members, caretakers, and with consultants:1} {Document social determinants of health affecting pt's care:1} {Document your decision making why or why not admission, treatments were needed:1} Final Clinical Impression(s) / ED Diagnoses Final diagnoses:  None    Rx / DC Orders ED Discharge Orders     None

## 2022-12-24 ENCOUNTER — Telehealth: Payer: Self-pay

## 2022-12-24 NOTE — Telephone Encounter (Signed)
Telephoned patient at mobile number. Left a voice message with [facility] contact information. 

## 2022-12-29 ENCOUNTER — Other Ambulatory Visit: Payer: Self-pay

## 2022-12-29 DIAGNOSIS — N644 Mastodynia: Secondary | ICD-10-CM

## 2022-12-29 NOTE — Telephone Encounter (Signed)
Telephoned patient at mobile number. Left a voice message with [facility] contact information. 

## 2023-01-27 ENCOUNTER — Ambulatory Visit: Payer: Self-pay

## 2023-01-27 ENCOUNTER — Other Ambulatory Visit: Payer: Self-pay

## 2023-02-19 ENCOUNTER — Encounter (HOSPITAL_COMMUNITY): Payer: Self-pay

## 2023-02-19 ENCOUNTER — Ambulatory Visit (HOSPITAL_COMMUNITY): Payer: BC Managed Care – PPO

## 2023-02-19 ENCOUNTER — Ambulatory Visit (HOSPITAL_COMMUNITY)
Admission: EM | Admit: 2023-02-19 | Discharge: 2023-02-19 | Disposition: A | Discharge status: Home / Self Care | Payer: BC Managed Care – PPO | Attending: Family Medicine | Admitting: Family Medicine

## 2023-02-19 DIAGNOSIS — M25562 Pain in left knee: Secondary | ICD-10-CM | POA: Diagnosis not present

## 2023-02-19 DIAGNOSIS — M5442 Lumbago with sciatica, left side: Secondary | ICD-10-CM | POA: Diagnosis not present

## 2023-02-19 MED ORDER — DEXAMETHASONE SODIUM PHOSPHATE 10 MG/ML IJ SOLN
INTRAMUSCULAR | Status: AC
  Filled 2023-02-19: qty 1

## 2023-02-19 MED ORDER — PREDNISONE 20 MG PO TABS: ORAL_TABLET | ORAL | 0 refills | Status: AC

## 2023-02-19 MED ORDER — KETOROLAC TROMETHAMINE 30 MG/ML IJ SOLN
30.0000 mg | Freq: Once | INTRAMUSCULAR | Status: AC
  Administered 2023-02-19: 30 mg via INTRAMUSCULAR

## 2023-02-19 MED ORDER — KETOROLAC TROMETHAMINE 30 MG/ML IJ SOLN
INTRAMUSCULAR | Status: AC
  Filled 2023-02-19: qty 1

## 2023-02-19 MED ORDER — DEXAMETHASONE SODIUM PHOSPHATE 10 MG/ML IJ SOLN
10.0000 mg | Freq: Once | INTRAMUSCULAR | Status: AC
  Administered 2023-02-19: 10 mg via INTRAMUSCULAR

## 2023-02-19 MED ORDER — CYCLOBENZAPRINE HCL 10 MG PO TABS: 10.0000 mg | ORAL_TABLET | Freq: Three times a day (TID) | ORAL | 0 refills | Status: AC | PRN

## 2023-02-19 NOTE — ED Triage Notes (Signed)
Pt reports she has had bad sciatica pain in the left knee x 2 months. Starting Monday she states it has radiated to her left knee cap and down to her ankle.   Took ibuprofen

## 2023-02-19 NOTE — Discharge Instructions (Addendum)
Your knee x-ray show no fracture or deformity that explains the pain you are experiencing in your left knee.   Ligament injuries are unable to be seen on x-ray.  If pain does not subside with prescribed treatment recommend follow-up with [facility] for further workup and evaluation of your symptoms.   Do not take any Ibuprofen,Meloxicam, or Naprosyn while taking 10 days of prednisone. Start prednisone tomorrow as you received a steroid shot here in clinic today.  For pain I have prescribed you cyclobenzaprine you may take every 8 hours as needed for knee and back pain.  Cyclobenzaprine causes drowsiness therefore avoid taking while driving or working.

## 2023-02-19 NOTE — ED Provider Notes (Signed)
[facility]    CSN: [id number] Arrival date & time: 02/19/23  1015      History   Chief Complaint No chief complaint on file.   HPI Anna Walls is a 42 y.o. female.   HPI Patient with a history of recurrent low back pain and left knee pain, presents today with a 4 day history of left knee pain exacerbation. Denies any recent injury. History of ligament injury related to fall over 3 years ago. Current left knee pain is causing low back pain exacerbation. She reports standing up at work for 8-10 hours per shift and feels this may have caused current episode of knee and back pain. She is having pain in knee with standing, extending left lower extremity and when at rest. Pain is most pronounced in back of knee and medial lateral region of knee. She has taken otc medication without improvement of knee pain. She has previously achieved relief with steroids and Toradol injections.  Past Medical History:  Diagnosis Date   Chronic headaches    Hyperemesis    Low back pain    Pericardial effusion    a. Trace by echo 12/2015.   Pericarditis    a. Post-partum 12/2015.   Preeclampsia in postpartum period    Sickle cell trait [facility])     Patient Active Problem List   Diagnosis Date Noted   Chronic nonintractable headache 05/06/2017   Tympanic membrane rupture, right 05/06/2017   Mastitis, right, acute 01/08/2016   Postoperative pulmonary edema (HCC) 01/07/2016   Chest pain at rest    Shortness of breath    Acute pericarditis    Preeclampsia in postpartum period 01/06/2016   Supervision of high risk pregnancy, antepartum 06/25/2015    History reviewed. No pertinent surgical history.  OB History     Gravida  5   Para  5   Term  5   Preterm      AB      Living  4      SAB      IAB      Ectopic      Multiple  0   Live Births  4            Home Medications    Prior to Admission medications   Medication Sig Start Date End Date Taking?  Authorizing Provider  cyclobenzaprine (FLEXERIL) 10 MG tablet Take 1 tablet (10 mg total) by mouth 3 (three) times daily as needed for muscle spasms. 02/19/23  Yes Bing Neighbors, NP  predniSONE (DELTASONE) 20 MG tablet Take 3 tablets (60 mg total) by mouth daily with breakfast for 3 days, THEN 2 tablets (40 mg total) daily with breakfast for 3 days, THEN 1 tablet (20 mg total) daily with breakfast for 2 days, THEN 0.5 tablets (10 mg total) daily with breakfast for 2 days. 02/19/23 03/01/23 Yes Bing Neighbors, NP  aspirin-acetaminophen-caffeine (EXCEDRIN MIGRAINE) [phone] MG tablet Take 1 tablet by mouth every 6 (six) hours as needed for headache.    [provider]  hydrochlorothiazide (HYDRODIURIL) 25 MG tablet Take 25 mg by mouth daily. 09/19/22   [provider]  lisinopril (ZESTRIL) 10 MG tablet Take 1 tablet (10 mg total) by mouth daily. Patient not taking: Reported on 12/16/2022 06/21/22   Eber Hong, MD  lisinopril (ZESTRIL) 20 MG tablet Take 20 mg by mouth daily.    [provider]  meloxicam (MOBIC) 15 MG tablet Take 15 mg by mouth daily.  11/13/22   [provider]  nortriptyline (PAMELOR) 10 MG capsule Take 3 capsules (30 mg total) by mouth at bedtime. Patient not taking: Reported on 12/16/2022 10/05/19   Huston Foley, MD  simvastatin (ZOCOR) 20 MG tablet Take 20 mg by mouth at bedtime. 09/18/22   [provider]    Family History Family History  Family history unknown: Yes    Social History Social History   Tobacco Use   Smoking status: Never   Smokeless tobacco: Never  Vaping Use   Vaping status: Never Used  Substance Use Topics   Alcohol use: No   Drug use: No     Allergies   Patient has no known allergies.   Review of Systems Review of Systems Pertinent negatives listed in HPI   Physical Exam Triage Vital Signs ED Triage Vitals  Encounter Vitals Group     BP 02/19/23 1028 109/76     Systolic BP Percentile --       Diastolic BP Percentile --      Pulse Rate 02/19/23 1028 73     Resp 02/19/23 1028 18     Temp 02/19/23 1028 98 F (36.7 C)     Temp Source 02/19/23 1028 Oral     SpO2 02/19/23 1028 95 %     Weight --      Height --      Head Circumference --      Peak Flow --      Pain Score 02/19/23 1030 10     Pain Loc --      Pain Education --      Exclude from Growth Chart --    No data found.  Updated Vital Signs BP 109/76 (BP Location: Right Arm)   Pulse 73   Temp 98 F (36.7 C) (Oral)   Resp 18   LMP 02/03/2023 (Approximate)   SpO2 95%   Visual Acuity Right Eye Distance:   Left Eye Distance:   Bilateral Distance:    Right Eye Near:   Left Eye Near:    Bilateral Near:     Physical Exam Vitals reviewed.  Constitutional:      Appearance: Normal appearance.  HENT:     Head: Normocephalic and atraumatic.     Nose: Nose normal.  Eyes:     Extraocular Movements: Extraocular movements intact.     Conjunctiva/sclera: Conjunctivae normal.     Pupils: Pupils are equal, round, and reactive to light.  Cardiovascular:     Rate and Rhythm: Normal rate and regular rhythm.  Pulmonary:     Effort: Pulmonary effort is normal.     Breath sounds: Normal breath sounds.  Musculoskeletal:     Cervical back: Normal range of motion and neck supple.     Right knee: Normal.     Left knee: Bony tenderness present. Decreased range of motion. Tenderness present over the medial joint line, MCL and patellar tendon. MCL laxity present.     Instability Tests: Anterior drawer test negative. Posterior drawer test negative. Anterior Lachman test negative. Medial McMurray test negative and lateral McMurray test negative.  Skin:    General: Skin is warm and dry.     Capillary Refill: Capillary refill takes less than 2 seconds.  Neurological:     General: No focal deficit present.     Mental Status: She is alert.      UC Treatments / Results  Labs (all labs ordered are listed, but only abnormal  results are  displayed) Labs Reviewed - No data to display  EKG   Radiology DG Knee Complete 4 Views Left  Result Date: 02/19/2023 CLINICAL DATA:  Right knee pain. EXAM: LEFT KNEE - COMPLETE 5 VIEW COMPARISON:  None Available. FINDINGS: No evidence of fracture, dislocation, or joint effusion. No evidence of arthropathy or other focal bone abnormality. Soft tissues are unremarkable. IMPRESSION: Negative left knee radiographs. Electronically Signed   By: Marin Roberts M.D.   On: 02/19/2023 12:31     Procedures Procedures (including critical care time)  Medications Ordered in UC Medications  ketorolac (TORADOL) 30 MG/ML injection 30 mg (30 mg Intramuscular Given 02/19/23 1141)  dexamethasone (DECADRON) injection 10 mg (10 mg Intramuscular Given 02/19/23 1140)    Initial Impression / Assessment and Plan / UC Course  I have reviewed the triage vital signs and the nursing notes.  Pertinent labs & imaging results that were available during my care of the patient were reviewed by me and considered in my medical decision making (see chart for details).    Recurrent left sided back pain with acute pain exacerbation with no atypical features or neurological focal deficits. Acute left knee, imaging unremarkable.Trial prednisone and cyclobenzaprine for acute pain. Recommend orthopedics follow-up if symptoms do not improve, as knee pain is possibly related to ligament injury. Patient verbalized understanding and agreement with plan.   Final Clinical Impressions(s) / UC Diagnoses   Final diagnoses:  Acute left-sided low back pain with left-sided sciatica  Acute pain of left knee     Discharge Instructions      Your knee x-ray show no fracture or deformity that explains the pain you are experiencing in your left knee.   Ligament injuries are unable to be seen on x-ray.  If pain does not subside with prescribed treatment recommend follow-up with [facility] for further workup and evaluation  of your symptoms.   Do not take any Ibuprofen,Meloxicam, or Naprosyn while taking 10 days of prednisone. Start prednisone tomorrow as you received a steroid shot here in clinic today.  For pain I have prescribed you cyclobenzaprine you may take every 8 hours as needed for knee and back pain.  Cyclobenzaprine causes drowsiness therefore avoid taking while driving or working.     ED Prescriptions     Medication Sig Dispense Auth. Provider   predniSONE (DELTASONE) 20 MG tablet Take 3 tablets (60 mg total) by mouth daily with breakfast for 3 days, THEN 2 tablets (40 mg total) daily with breakfast for 3 days, THEN 1 tablet (20 mg total) daily with breakfast for 2 days, THEN 0.5 tablets (10 mg total) daily with breakfast for 2 days. 18 tablet Bing Neighbors, NP   cyclobenzaprine (FLEXERIL) 10 MG tablet Take 1 tablet (10 mg total) by mouth 3 (three) times daily as needed for muscle spasms. 30 tablet Bing Neighbors, NP      PDMP not reviewed this encounter.   Bing Neighbors, NP 02/22/23 [phone]
